# Patient Record
Sex: Male | Born: 1935 | Race: White | Hispanic: No | Marital: Married | Smoking: Former smoker
Health system: Southern US, Community
[De-identification: ages and names within clinical notes are randomized; demographics above are authoritative.]

## PROBLEM LIST (undated history)

## (undated) DIAGNOSIS — K56609 Unspecified intestinal obstruction, unspecified as to partial versus complete obstruction: Secondary | ICD-10-CM

## (undated) DIAGNOSIS — N184 Chronic kidney disease, stage 4 (severe): Secondary | ICD-10-CM

## (undated) DIAGNOSIS — J189 Pneumonia, unspecified organism: Secondary | ICD-10-CM

## (undated) DIAGNOSIS — E1142 Type 2 diabetes mellitus with diabetic polyneuropathy: Secondary | ICD-10-CM

## (undated) DIAGNOSIS — Z5189 Encounter for other specified aftercare: Secondary | ICD-10-CM

## (undated) DIAGNOSIS — M1990 Unspecified osteoarthritis, unspecified site: Secondary | ICD-10-CM

## (undated) DIAGNOSIS — Z8719 Personal history of other diseases of the digestive system: Secondary | ICD-10-CM

## (undated) DIAGNOSIS — F32A Depression, unspecified: Secondary | ICD-10-CM

## (undated) DIAGNOSIS — IMO0001 Reserved for inherently not codable concepts without codable children: Secondary | ICD-10-CM

## (undated) DIAGNOSIS — D649 Anemia, unspecified: Secondary | ICD-10-CM

## (undated) DIAGNOSIS — F329 Major depressive disorder, single episode, unspecified: Secondary | ICD-10-CM

## (undated) DIAGNOSIS — I739 Peripheral vascular disease, unspecified: Secondary | ICD-10-CM

## (undated) DIAGNOSIS — R413 Other amnesia: Secondary | ICD-10-CM

## (undated) DIAGNOSIS — K219 Gastro-esophageal reflux disease without esophagitis: Secondary | ICD-10-CM

## (undated) DIAGNOSIS — M4807 Spinal stenosis, lumbosacral region: Secondary | ICD-10-CM

## (undated) DIAGNOSIS — I10 Essential (primary) hypertension: Secondary | ICD-10-CM

## (undated) DIAGNOSIS — A0472 Enterocolitis due to Clostridium difficile, not specified as recurrent: Secondary | ICD-10-CM

## (undated) DIAGNOSIS — K279 Peptic ulcer, site unspecified, unspecified as acute or chronic, without hemorrhage or perforation: Secondary | ICD-10-CM

## (undated) DIAGNOSIS — R269 Unspecified abnormalities of gait and mobility: Secondary | ICD-10-CM

## (undated) DIAGNOSIS — E785 Hyperlipidemia, unspecified: Secondary | ICD-10-CM

## (undated) DIAGNOSIS — I219 Acute myocardial infarction, unspecified: Secondary | ICD-10-CM

## (undated) HISTORY — DX: Peptic ulcer, site unspecified, unspecified as acute or chronic, without hemorrhage or perforation: K27.9

## (undated) HISTORY — PX: EYE SURGERY: SHX253

## (undated) HISTORY — DX: Spinal stenosis, lumbosacral region: M48.07

## (undated) HISTORY — PX: LUMBAR LAMINECTOMY: SHX95

## (undated) HISTORY — PX: OTHER SURGICAL HISTORY: SHX169

## (undated) HISTORY — DX: Unspecified abnormalities of gait and mobility: R26.9

## (undated) HISTORY — PX: LIPOMA RESECTION: SHX23

## (undated) HISTORY — DX: Hyperlipidemia, unspecified: E78.5

## (undated) HISTORY — DX: Enterocolitis due to Clostridium difficile, not specified as recurrent: A04.72

## (undated) HISTORY — DX: Unspecified osteoarthritis, unspecified site: M19.90

## (undated) HISTORY — DX: Major depressive disorder, single episode, unspecified: F32.9

## (undated) HISTORY — PX: HEMORROIDECTOMY: SUR656

## (undated) HISTORY — DX: Other amnesia: R41.3

## (undated) HISTORY — PX: TRANSURETHRAL RESECTION OF PROSTATE: SHX73

## (undated) HISTORY — DX: Personal history of other diseases of the digestive system: Z87.19

## (undated) HISTORY — PX: CATARACT EXTRACTION: SUR2

## (undated) HISTORY — DX: Depression, unspecified: F32.A

## (undated) HISTORY — PX: URETERAL STENT PLACEMENT: SHX822

## (undated) HISTORY — PX: CERVICAL LAMINECTOMY: SHX94

## (undated) HISTORY — PX: CARPAL TUNNEL RELEASE: SHX101

---

## 1997-06-04 HISTORY — PX: FEMORAL-POPLITEAL BYPASS GRAFT: SHX937

## 1997-07-08 HISTORY — PX: FEMORAL-POPLITEAL BYPASS GRAFT: SHX937

## 1998-01-13 ENCOUNTER — Inpatient Hospital Stay (HOSPITAL_COMMUNITY): Admission: RE | Admit: 1998-01-13 | Discharge: 1998-01-15 | Payer: Self-pay | Admitting: Neurological Surgery

## 1998-06-24 ENCOUNTER — Encounter: Payer: Self-pay | Admitting: Neurological Surgery

## 1998-06-24 ENCOUNTER — Ambulatory Visit (HOSPITAL_COMMUNITY): Admission: RE | Admit: 1998-06-24 | Discharge: 1998-06-24 | Payer: Self-pay | Admitting: Neurological Surgery

## 1999-02-10 ENCOUNTER — Ambulatory Visit (HOSPITAL_COMMUNITY): Admission: RE | Admit: 1999-02-10 | Discharge: 1999-02-10 | Payer: Self-pay | Admitting: Internal Medicine

## 1999-02-10 ENCOUNTER — Encounter: Payer: Self-pay | Admitting: Internal Medicine

## 1999-04-01 ENCOUNTER — Encounter (INDEPENDENT_AMBULATORY_CARE_PROVIDER_SITE_OTHER): Payer: Self-pay | Admitting: Specialist

## 1999-04-01 ENCOUNTER — Ambulatory Visit (HOSPITAL_COMMUNITY): Admission: RE | Admit: 1999-04-01 | Discharge: 1999-04-01 | Payer: Self-pay | Admitting: *Deleted

## 1999-04-11 ENCOUNTER — Ambulatory Visit (HOSPITAL_COMMUNITY): Admission: RE | Admit: 1999-04-11 | Discharge: 1999-04-11 | Payer: Self-pay | Admitting: Oncology

## 1999-04-11 ENCOUNTER — Encounter (HOSPITAL_COMMUNITY): Payer: Self-pay | Admitting: Oncology

## 1999-05-13 ENCOUNTER — Ambulatory Visit (HOSPITAL_COMMUNITY): Admission: RE | Admit: 1999-05-13 | Discharge: 1999-05-13 | Payer: Self-pay | Admitting: *Deleted

## 1999-05-19 ENCOUNTER — Ambulatory Visit (HOSPITAL_COMMUNITY): Admission: RE | Admit: 1999-05-19 | Discharge: 1999-05-19 | Payer: Self-pay | Admitting: Oncology

## 1999-05-19 ENCOUNTER — Encounter (INDEPENDENT_AMBULATORY_CARE_PROVIDER_SITE_OTHER): Payer: Self-pay | Admitting: *Deleted

## 2000-01-20 ENCOUNTER — Encounter: Payer: Self-pay | Admitting: Orthopedic Surgery

## 2000-01-20 ENCOUNTER — Ambulatory Visit (HOSPITAL_COMMUNITY): Admission: RE | Admit: 2000-01-20 | Discharge: 2000-01-20 | Payer: Self-pay | Admitting: Orthopedic Surgery

## 2001-03-13 ENCOUNTER — Encounter: Admission: RE | Admit: 2001-03-13 | Discharge: 2001-03-13 | Payer: Self-pay | Admitting: *Deleted

## 2001-03-13 ENCOUNTER — Encounter: Payer: Self-pay | Admitting: Otolaryngology

## 2002-04-30 ENCOUNTER — Ambulatory Visit (HOSPITAL_BASED_OUTPATIENT_CLINIC_OR_DEPARTMENT_OTHER): Admission: RE | Admit: 2002-04-30 | Discharge: 2002-04-30 | Payer: Self-pay | Admitting: General Surgery

## 2004-12-13 ENCOUNTER — Emergency Department (HOSPITAL_COMMUNITY): Admission: EM | Admit: 2004-12-13 | Discharge: 2004-12-13 | Payer: Self-pay | Admitting: Emergency Medicine

## 2004-12-18 ENCOUNTER — Inpatient Hospital Stay (HOSPITAL_COMMUNITY): Admission: EM | Admit: 2004-12-18 | Discharge: 2004-12-21 | Payer: Self-pay | Admitting: Emergency Medicine

## 2004-12-18 ENCOUNTER — Ambulatory Visit: Payer: Self-pay | Admitting: Internal Medicine

## 2005-01-23 ENCOUNTER — Emergency Department (HOSPITAL_COMMUNITY): Admission: EM | Admit: 2005-01-23 | Discharge: 2005-01-23 | Payer: Self-pay | Admitting: Emergency Medicine

## 2005-02-17 ENCOUNTER — Ambulatory Visit (HOSPITAL_COMMUNITY): Admission: RE | Admit: 2005-02-17 | Discharge: 2005-02-17 | Payer: Self-pay | Admitting: *Deleted

## 2005-02-17 ENCOUNTER — Encounter (INDEPENDENT_AMBULATORY_CARE_PROVIDER_SITE_OTHER): Payer: Self-pay | Admitting: Specialist

## 2005-06-16 ENCOUNTER — Encounter (HOSPITAL_COMMUNITY): Admission: RE | Admit: 2005-06-16 | Discharge: 2005-09-14 | Payer: Self-pay | Admitting: Nephrology

## 2005-06-18 ENCOUNTER — Encounter: Admission: RE | Admit: 2005-06-18 | Discharge: 2005-06-18 | Payer: Self-pay | Admitting: Nephrology

## 2005-06-20 ENCOUNTER — Encounter: Admission: RE | Admit: 2005-06-20 | Discharge: 2005-06-20 | Payer: Self-pay | Admitting: Nephrology

## 2005-07-05 ENCOUNTER — Observation Stay (HOSPITAL_COMMUNITY): Admission: RE | Admit: 2005-07-05 | Discharge: 2005-07-06 | Payer: Self-pay | Admitting: Nephrology

## 2005-08-11 ENCOUNTER — Encounter: Admission: RE | Admit: 2005-08-11 | Discharge: 2005-08-11 | Payer: Self-pay | Admitting: Nephrology

## 2005-09-11 ENCOUNTER — Emergency Department (HOSPITAL_COMMUNITY): Admission: EM | Admit: 2005-09-11 | Discharge: 2005-09-11 | Payer: Self-pay | Admitting: Emergency Medicine

## 2005-10-13 ENCOUNTER — Encounter (HOSPITAL_COMMUNITY): Admission: RE | Admit: 2005-10-13 | Discharge: 2006-01-11 | Payer: Self-pay | Admitting: Nephrology

## 2006-02-08 ENCOUNTER — Emergency Department (HOSPITAL_COMMUNITY): Admission: EM | Admit: 2006-02-08 | Discharge: 2006-02-09 | Payer: Self-pay | Admitting: Emergency Medicine

## 2006-02-10 ENCOUNTER — Encounter: Admission: RE | Admit: 2006-02-10 | Discharge: 2006-02-10 | Payer: Self-pay | Admitting: Interventional Radiology

## 2006-08-31 ENCOUNTER — Encounter: Admission: RE | Admit: 2006-08-31 | Discharge: 2006-08-31 | Payer: Self-pay | Admitting: Interventional Radiology

## 2006-11-23 ENCOUNTER — Encounter: Admission: RE | Admit: 2006-11-23 | Discharge: 2006-11-23 | Payer: Self-pay | Admitting: Nephrology

## 2006-12-16 ENCOUNTER — Ambulatory Visit: Payer: Self-pay | Admitting: Vascular Surgery

## 2007-06-05 ENCOUNTER — Ambulatory Visit: Payer: Self-pay | Admitting: Vascular Surgery

## 2007-08-30 ENCOUNTER — Encounter: Admission: RE | Admit: 2007-08-30 | Discharge: 2007-08-30 | Payer: Self-pay | Admitting: Interventional Radiology

## 2007-12-04 ENCOUNTER — Ambulatory Visit: Payer: Self-pay | Admitting: Surgery

## 2008-06-03 ENCOUNTER — Ambulatory Visit: Payer: Self-pay | Admitting: Surgery

## 2008-11-27 ENCOUNTER — Ambulatory Visit: Payer: Self-pay | Admitting: Vascular Surgery

## 2009-01-28 ENCOUNTER — Encounter: Admission: RE | Admit: 2009-01-28 | Discharge: 2009-01-28 | Payer: Self-pay | Admitting: Orthopedic Surgery

## 2009-03-28 ENCOUNTER — Ambulatory Visit: Payer: Self-pay | Admitting: Vascular Surgery

## 2009-07-04 ENCOUNTER — Ambulatory Visit: Payer: Self-pay | Admitting: Vascular Surgery

## 2009-07-14 ENCOUNTER — Encounter: Admission: RE | Admit: 2009-07-14 | Discharge: 2009-07-14 | Payer: Self-pay | Admitting: Neurology

## 2010-02-17 ENCOUNTER — Ambulatory Visit: Payer: Self-pay | Admitting: Vascular Surgery

## 2010-02-22 ENCOUNTER — Emergency Department (HOSPITAL_COMMUNITY): Admission: EM | Admit: 2010-02-22 | Discharge: 2010-02-22 | Payer: Self-pay | Admitting: Emergency Medicine

## 2010-06-13 ENCOUNTER — Inpatient Hospital Stay (HOSPITAL_COMMUNITY)
Admission: EM | Admit: 2010-06-13 | Discharge: 2010-06-17 | Payer: Self-pay | Source: Home / Self Care | Attending: Internal Medicine | Admitting: Internal Medicine

## 2010-07-14 ENCOUNTER — Encounter
Admission: RE | Admit: 2010-07-14 | Discharge: 2010-07-14 | Payer: Self-pay | Source: Home / Self Care | Attending: Gastroenterology | Admitting: Gastroenterology

## 2010-08-31 LAB — COMPREHENSIVE METABOLIC PANEL
ALT: 10 U/L (ref 0–53)
BUN: 28 mg/dL — ABNORMAL HIGH (ref 6–23)
CO2: 24 mEq/L (ref 19–32)
CO2: 27 mEq/L (ref 19–32)
Calcium: 8.3 mg/dL — ABNORMAL LOW (ref 8.4–10.5)
Chloride: 102 mEq/L (ref 96–112)
Chloride: 109 mEq/L (ref 96–112)
Creatinine, Ser: 1.83 mg/dL — ABNORMAL HIGH (ref 0.4–1.5)
GFR calc non Af Amer: 35 mL/min — ABNORMAL LOW (ref >60)
GFR calc non Af Amer: 52 mL/min — ABNORMAL LOW (ref >60)
Glucose, Bld: 134 mg/dL — ABNORMAL HIGH (ref 70–99)
Glucose, Bld: 152 mg/dL — ABNORMAL HIGH (ref 70–99)
Sodium: 140 mEq/L (ref 135–145)
Total Bilirubin: 0.6 mg/dL (ref 0.3–1.2)
Total Bilirubin: 0.8 mg/dL (ref 0.3–1.2)

## 2010-08-31 LAB — GLUCOSE, CAPILLARY
Glucose-Capillary: 145 mg/dL — ABNORMAL HIGH (ref 70–99)
Glucose-Capillary: 184 mg/dL — ABNORMAL HIGH (ref 70–99)
Glucose-Capillary: 89 mg/dL (ref 70–99)
Glucose-Capillary: 99 mg/dL (ref 70–99)

## 2010-08-31 LAB — HEMOGLOBIN AND HEMATOCRIT, BLOOD
HCT: 34.2 % — ABNORMAL LOW (ref 39.0–52.0)
HCT: 35.3 % — ABNORMAL LOW (ref 39.0–52.0)
Hemoglobin: 11.1 g/dL — ABNORMAL LOW (ref 13.0–17.0)
Hemoglobin: 11.2 g/dL — ABNORMAL LOW (ref 13.0–17.0)
Hemoglobin: 11.8 g/dL — ABNORMAL LOW (ref 13.0–17.0)

## 2010-08-31 LAB — DIFFERENTIAL
Basophils Absolute: 0 10*3/uL (ref 0.0–0.1)
Basophils Relative: 0 % (ref 0–1)
Lymphocytes Relative: 7 % — ABNORMAL LOW (ref 12–46)
Neutro Abs: 13.1 10*3/uL — ABNORMAL HIGH (ref 1.7–7.7)

## 2010-08-31 LAB — URINALYSIS, ROUTINE W REFLEX MICROSCOPIC
Ketones, ur: NEGATIVE mg/dL
Leukocytes, UA: NEGATIVE
Nitrite: NEGATIVE
pH: 5.5 (ref 5.0–8.0)

## 2010-08-31 LAB — GASTRIC OCCULT BLOOD (1-CARD TO LAB): Occult Blood, Gastric: POSITIVE — AB

## 2010-08-31 LAB — BASIC METABOLIC PANEL
CO2: 23 mEq/L (ref 19–32)
Calcium: 8.4 mg/dL (ref 8.4–10.5)
Chloride: 114 mEq/L — ABNORMAL HIGH (ref 96–112)
GFR calc Af Amer: >60 mL/min (ref >60)
GFR calc non Af Amer: >60 mL/min (ref >60)
Glucose, Bld: 84 mg/dL (ref 70–99)
Glucose, Bld: 93 mg/dL (ref 70–99)
Potassium: 3.9 mEq/L (ref 3.5–5.1)
Sodium: 143 mEq/L (ref 135–145)
Sodium: 144 mEq/L (ref 135–145)

## 2010-08-31 LAB — MAGNESIUM: Magnesium: 2.2 mg/dL (ref 1.5–2.5)

## 2010-08-31 LAB — LIPASE, BLOOD: Lipase: 26 U/L (ref 11–59)

## 2010-08-31 LAB — MRSA PCR SCREENING: MRSA by PCR: NEGATIVE

## 2010-08-31 LAB — CBC
HCT: 31.9 % — ABNORMAL LOW (ref 39.0–52.0)
HCT: 41.2 % (ref 39.0–52.0)
Hemoglobin: 10.2 g/dL — ABNORMAL LOW (ref 13.0–17.0)
Hemoglobin: 13.6 g/dL (ref 13.0–17.0)
MCH: 29.4 pg (ref 26.0–34.0)
MCHC: 32 g/dL (ref 30.0–36.0)
MCV: 89.2 fL (ref 78.0–100.0)
RBC: 3.63 MIL/uL — ABNORMAL LOW (ref 4.22–5.81)
RBC: 4.62 MIL/uL (ref 4.22–5.81)
WBC: 6.6 10*3/uL (ref 4.0–10.5)

## 2010-08-31 LAB — URINE MICROSCOPIC-ADD ON

## 2010-08-31 LAB — TYPE AND SCREEN: ABO/RH(D): O NEG

## 2010-09-03 LAB — POCT I-STAT, CHEM 8
Chloride: 101 mEq/L (ref 96–112)
Creatinine, Ser: 2.1 mg/dL — ABNORMAL HIGH (ref 0.4–1.5)
Glucose, Bld: 173 mg/dL — ABNORMAL HIGH (ref 70–99)
HCT: 31 % — ABNORMAL LOW (ref 39.0–52.0)
Hemoglobin: 10.5 g/dL — ABNORMAL LOW (ref 13.0–17.0)
Potassium: 3.6 mEq/L (ref 3.5–5.1)
Sodium: 137 mEq/L (ref 135–145)

## 2010-09-03 LAB — CBC
HCT: 30.7 % — ABNORMAL LOW (ref 39.0–52.0)
Hemoglobin: 10.5 g/dL — ABNORMAL LOW (ref 13.0–17.0)
RBC: 3.53 MIL/uL — ABNORMAL LOW (ref 4.22–5.81)
WBC: 17 10*3/uL — ABNORMAL HIGH (ref 4.0–10.5)

## 2010-09-03 LAB — URINALYSIS, ROUTINE W REFLEX MICROSCOPIC
Glucose, UA: NEGATIVE mg/dL
Hgb urine dipstick: NEGATIVE
Protein, ur: NEGATIVE mg/dL
Specific Gravity, Urine: 1.013 (ref 1.005–1.030)
Urobilinogen, UA: 1 mg/dL (ref 0.0–1.0)

## 2010-09-03 LAB — DIFFERENTIAL
Lymphocytes Relative: 6 % — ABNORMAL LOW (ref 12–46)
Lymphs Abs: 1 10*3/uL (ref 0.7–4.0)
Monocytes Absolute: 1.3 10*3/uL — ABNORMAL HIGH (ref 0.1–1.0)
Monocytes Relative: 8 % (ref 3–12)
Neutro Abs: 14.6 10*3/uL — ABNORMAL HIGH (ref 1.7–7.7)
Neutrophils Relative %: 86 % — ABNORMAL HIGH (ref 43–77)

## 2010-09-03 LAB — GLUCOSE, CAPILLARY: Glucose-Capillary: 173 mg/dL — ABNORMAL HIGH (ref 70–99)

## 2010-11-03 NOTE — Procedures (Signed)
BYPASS GRAFT EVALUATION   INDICATION:  Followup bilateral femoral-popliteal artery bypass grafts,  stable minimal claudication.   HISTORY:  Diabetes:  Yes.  Cardiac:  MI in 1974.  Hypertension:  Yes.  Smoking:  Quit.  Previous Surgery:  Bilateral femoral-popliteal artery bypass grafts.   SINGLE LEVEL ARTERIAL EXAM                               RIGHT              LEFT  Brachial:                    138                135  Anterior tibial:             120                147  Posterior tibial:            132                78  Peroneal:  Ankle/brachial index:        0.96               1.07   PREVIOUS ABI:  Date: 12/04/2007  RIGHT:  0.9  LEFT:  0.72/noncompressible   LOWER EXTREMITY BYPASS GRAFT DUPLEX EXAM:   DUPLEX:  1. Doppler arterial waveforms appear biphasic proximal to, within and      distal to bypass grafts with no evidence of graft stenosis.  2. Known elevated velocities in native distal to right bypass graft as      documented 07/09/2005, today at 379 cm/s.   IMPRESSION:  1. Patent bilateral femoral-popliteal artery bypass grafts.  2. Right ABI appears stable.  3. Left ABI shows increase from previous study, however correlates      with studies before that.  4. Stable known stenosis in native artery distal to the right bypass      graft.      ___________________________________________  Larina Earthly, M.D.   AS/MEDQ  D:  06/03/2008  T:  06/03/2008  Job:  [id number]

## 2010-11-03 NOTE — Procedures (Signed)
BYPASS GRAFT EVALUATION   INDICATION:  Followup, bilateral fem-pop bypass grafts.   HISTORY:  Diabetes:  Yes.  Cardiac:  MI in 1974.  Hypertension:  Yes.  Smoking:  Quit 34 years ago.  Previous Surgery:  Bilateral fem-pop bypass grafts.   SINGLE LEVEL ARTERIAL EXAM                               RIGHT              LEFT  Brachial:                    119                121  Anterior tibial:             102                Noncompressible  Posterior tibial:            109                93  Peroneal:  Ankle/brachial index:        0.9                0.72   PREVIOUS ABI:  Date: 06/05/07  RIGHT:  0.84  LEFT:  1.02   LOWER EXTREMITY BYPASS GRAFT DUPLEX EXAM:   DUPLEX:  Biphasic Doppler waveforms noted throughout the bilateral lower  extremity bypass grafts in their native vessels with no evidence of  increased velocities noted.   IMPRESSION:  1. Patent bilateral femoropopliteal bypass grafts with no evidence of      stenosis.  2. No significant change noted in the right ankle brachial index since      previous examination.  3. The left ankle brachial index has decreased significantly; however,      this is due to the previously used ankle pressure site becoming      calcified.     ___________________________________________  Larina Earthly, M.D.   CH/MEDQ  D:  12/04/2007  T:  12/04/2007  Job:  [id number]

## 2010-11-03 NOTE — Procedures (Signed)
BYPASS GRAFT EVALUATION   INDICATION:  Follow-up evaluation of lower extremity bypass graft.   HISTORY:  Diabetes:  Yes.  Cardiac:  MI in 1974.  Hypertension:  Yes.  Smoking:  Quit.  Previous Surgery:  Bilateral femoral to popliteal artery bypass graft.   SINGLE LEVEL ARTERIAL EXAM                               RIGHT              LEFT  Brachial:                    133                126  Anterior tibial:             142                155  Posterior tibial:            108                109  Peroneal:  Ankle/brachial index:        1.07               1.17   PREVIOUS ABI:  Date:  06/03/08  RIGHT:  0.96  LEFT:  1.07   LOWER EXTREMITY BYPASS GRAFT DUPLEX EXAM:   DUPLEX:  1. Increased velocity of 499 cm/s noted at the distal bypass native      artery, which was known to be 379 cm/s from the previous study.  2. Biphasic duplex waveforms noted within bilateral bypass graft in      native arteries.   IMPRESSION:  1. Patent bilateral femoral to popliteal artery bypass graft with no      evidence of focal stenosis.  2. Normal bilateral lower extremity ankle brachial index with biphasic      Doppler waveforms.   ___________________________________________  Larina Earthly, M.D.   AC/MEDQ  D:  11/27/2008  T:  11/27/2008  Job:  [id number]

## 2010-11-03 NOTE — Procedures (Signed)
BYPASS GRAFT EVALUATION   INDICATION:  Followup bilateral femoral popliteal bypass grafts   HISTORY:  Diabetes:  yes  Cardiac:  MI in 1974  Hypertension:  yes  Smoking:  Previous  Previous Surgery:  Bilateral femoral popliteal artery bypass grafts   SINGLE LEVEL ARTERIAL EXAM                               RIGHT              LEFT  Brachial:                    173                124  Anterior tibial:             134                78  Posterior tibial:            108                65  Peroneal:  Ankle/brachial index:        1.08               0.63   PREVIOUS ABI:  Date: 07/04/2009  RIGHT:  0.80  LEFT:  0.70   LOWER EXTREMITY BYPASS GRAFT DUPLEX EXAM:   DUPLEX:  Patent bilateral femoral popliteal artery bypass grafts with  elevated velocities of 480 cm/sec at outflow level of right leg.   IMPRESSION:  1. Stable ankle brachial indices bilateral.  2. Patent bilateral femoral popliteal artery bypass grafts by      velocities as noted above.   ___________________________________________  Larina Earthly, M.D.   CB/MEDQ  D:  02/18/2010  T:  02/18/2010  Job:  [id number]

## 2010-11-03 NOTE — Procedures (Signed)
BYPASS GRAFT EVALUATION   INDICATION:  Bilateral femoral-popliteal bypass grafts.   HISTORY:  Diabetes:  Yes.  Cardiac:  MI in 1974.  Hypertension:  Yes.  Smoking:  Previous.  Previous Surgery:  Bilateral femoral-popliteal bypass grafts.   SINGLE LEVEL ARTERIAL EXAM                               RIGHT              LEFT  Brachial:                    115                111  Anterior tibial:             44                 35  Posterior tibial:            92                 80  Peroneal:  Ankle/brachial index:        0.80               0.70   PREVIOUS ABI:  Date:  11/27/2008  RIGHT:  1.07  LEFT:  1.07   LOWER EXTREMITY BYPASS GRAFT DUPLEX EXAM:   DUPLEX:  Biphasic Doppler waveforms throughout bilateral bypass grafts  and native arteries.   IMPRESSION:  1. Patent bilateral bypass grafts with no evidence of stenosis.  2. Ankle brachial indices suggest mild to moderate disease      bilaterally.   ___________________________________________  Larina Earthly, M.D.   CJ/MEDQ  D:  07/04/2009  T:  07/04/2009  Job:  [id number]

## 2010-11-03 NOTE — Assessment & Plan Note (Signed)
OFFICE VISIT   Knapp, Joshua  DOB:  [birth year]                                       03/28/2009  JYNWG#:[id number]   The patient presents today for evaluation of bilateral lower extremity  weakness.  He is a very pleasant 77 year old gentleman well-known to me  from staged bilateral femoral to popliteal bypasses.  He had undergone a  left fem-pop bypass in 1999 and right fem-pop bypass in 1998.  He has  been followed in our noninvasive labs since then with patent grafts  bilaterally.  He now complains of pain and weakness and numbness in both  lower extremities.  He reports that this occurs at rest but becomes much  more severe with walking.  He reports that they become numb and he is  unable to continue walking.  He does not have any tissue loss.  He  denies any new recent cardiac difficulties.  He does have blood pressure  today of 116/66, pulse 58, respirations 18.  His radial pulses are 2+  bilaterally.  He does have well-perfused feet bilaterally with palpable  popliteal pulses bilaterally.  I do not feel pedal pulses.   I reviewed his noninvasive vascular studies with him.  Most recently he  underwent studies in June showing normal ankle-arm indices bilaterally.  He does have some narrowing in his native vessels below the bypass but  no limitation of flow.  I discussed this with the patient and his wife.  I do not see any evidence of any arterial insufficiency that would  explain his lower extremity difficulty.  This does potentially appear to  be neurogenic.  He has seen Dr. Darrelyn Hillock who apparently does not see any  specific back issues that would explain this as well.  He is to see Dr.  Renne Crigler next week and will continue discussion with this and will see [place]  in our noninvasive lab.   Larina Earthly, M.D.  Electronically Signed   TFE/MEDQ  D:  03/28/2009  T:  03/31/2009  Job:  [id number]   cc:   Windy Fast A. Darrelyn Hillock, M.D.  Soyla Murphy. Renne Crigler, M.D.

## 2010-11-03 NOTE — Procedures (Signed)
BYPASS GRAFT EVALUATION   INDICATION:  Follow up bilateral fem-pop bypass graft.   HISTORY:  Diabetes:  Yes.  Cardiac:  MI in 1974.  Hypertension:  Yes.  Smoking:  Quit 34 years ago.  Previous Surgery:  Bilateral fem-pop bypass graft.   SINGLE LEVEL ARTERIAL EXAM                               RIGHT              LEFT  Brachial:                    127                129  Anterior tibial:             89                 132  Posterior tibial:            109                73  Peroneal:  Ankle/brachial index:        0.84               1.02   PREVIOUS ABI:  Date:  12/16/2006  RIGHT:  0.80  LEFT:  >1   LOWER EXTREMITY BYPASS GRAFT DUPLEX EXAM:   DUPLEX:  Patent bilateral fem-pop bypass graft with no evidence of focal  stenosis.   IMPRESSION:  1. Patent bilateral femoral-popliteal bypass graft with no evidence of      focal stenosis.  2. Normal ankle-brachial index with biphasic Doppler waveform noted in      left leg.  3. Mildly abnormal ankle-brachial index with biphasic Doppler waveform      noted in the right leg.  Status post bilateral femoral-popliteal      bypass graft.   ___________________________________________  Larina Earthly, M.D.   MG/MEDQ  D:  06/05/2007  T:  06/06/2007  Job:  [id number]

## 2010-11-03 NOTE — Assessment & Plan Note (Signed)
OFFICE VISIT   CISZEWSKI, Joshua  DOB:  September 05, 1920                                       07/04/2009  CHART#:[id number]   The patient presents today for continued followup of his peripheral  vascular occlusive disease.  He is status post bilateral staged femoral-  popliteal bypasses by myself in the right leg in 1998 and left leg in  1999.  He continues to have overall failure in health.  His main  complaints are bilateral lower extremity pain.  This is in his entire  left leg.  He reports he has difficulty in getting into a car from this.  He does have discomfort in both feet.  This does appear to be  neuropathic type pain.  He does not walk to the level of claudication.  His past history is otherwise unchanged.   PHYSICAL EXAM:  General:  A well-developed frail-appearing gentleman  appearing his stated age of 24.  Vital signs:  Blood pressure is 100/61,  pulse 62, respirations 18.  His temperature is 97.6.  His radial pulses  are 2+.  He has 2+ popliteal pulses bilaterally.  His feet have no  rashes.  He does have some dependent rubor.   He underwent noninvasive vascular laboratory studies in our office today  and I reviewed this with the patient and his wife.  This shows no  evidence of stenosis throughout his grafts on duplex imaging  bilaterally.  Ankle arm index is near normal at 0.8 on the right and 0.7  on the left.  I explained to the patient and his wife I do not see any  evidence for arterial insufficiency causing his leg discomfort.  He will  continue his followup with Dr. Darrelyn Hillock to see if anything can be done  for his discomfort.  He does have CT scans in the past showing moderate  stenosis in his lumbar spine.     Larina Earthly, M.D.  Electronically Signed   TFE/MEDQ  D:  07/04/2009  T:  07/04/2009  Job:  [id number]   cc:   Soyla Murphy. Renne Crigler, M.D.  Ronald A. Darrelyn Hillock, M.D.

## 2010-11-03 NOTE — Procedures (Signed)
BYPASS GRAFT EVALUATION   INDICATION:  Followup evaluation of lower extremity bypass grafts  bilaterally.   HISTORY:  Diabetes:  Orally controlled.  Cardiac:  MI in 1974.  Hypertension:  Yes.  Smoking:  Quit in 1974.  Previous Surgery:  Right fem-pop bypass graft with translocated  nonreversed saphenous vein by Dr. Arbie Cookey on July 08, 1997.  Left fem-  pop bypass graft with translocated nonreversed saphenous vein by Dr.  Arbie Cookey on June 04, 1997.   SINGLE LEVEL ARTERIAL EXAMINATION:   SINGLE LEVEL ARTERIAL EXAM                               RIGHT              LEFT  Brachial:                    148.               146.  Anterior tibial:             114.               165.  Posterior tibial:            119.               73.  Peroneal:  Ankle/brachial index:        0.80.              Greater than 1.0.   PREVIOUS ABI:  Date:  July 08, 2006.  RIGHT:  0.79.  LEFT:  Greater  than 1.0.   LOWER EXTREMITY BYPASS GRAFT DUPLEX EXAM:   DUPLEX:  Wave forms are biphasic proximal to within and distal to the  right fem-pop bypass graft.   Wave forms are biphasic proximal to within and distal to the left fem-  pop bypass graft with monophasic Doppler wave forms at the tibial level  and the ankle.   IMPRESSION:  ABIs are stable from previous study bilaterally.   Patent left fem-pop bypass graft.   Patent right fem-pop bypass graft.   ___________________________________________  Larina Earthly, M.D.   MC/MEDQ  D:  12/16/2006  T:  12/16/2006  Job:  [id number]

## 2010-11-06 NOTE — H&P (Signed)
NAME:  CORIAN, HANDLEY NO.:  [id number]   MEDICAL RECORD NO.:  [id number]          PATIENT TYPE:  EMS   LOCATION:  ED                           FACILITY:  [facility]   PHYSICIAN:  Mobolaji B. Bakare, M.D.DATE OF BIRTH:  [birth year]   DATE OF ADMISSION:  12/17/2004  DATE OF DISCHARGE:                                HISTORY & PHYSICAL   PRIMARY CARE PHYSICIAN:  Soyla Murphy. Renne Crigler, M.D.   UROLOGIST:  Excell Seltzer. Annabell Howells, M.D.   CHIEF COMPLAINT:  Nausea/vomiting.   HISTORY OF PRESENT ILLNESS:  Mr. Devynn Scheff started feeling nauseous and  unwell about 12 midday yesterday.  He had reduced appetite.  Later during  the day, he went to the grocery store with his wife and had an accidental  fall, subsequently vomited about 2-3 times and was brought to the emergency  department for evaluation.  He did not sustain any fracture.  He did have  initial pain over the left hip.  An x-ray did not show any bone injury.  He  was noted to have coffee ground vomitus but there was no frank bleeding.  Rectal examination done in the ER showed negative fecal Hemoccult.  The  patient is being admitted for evaluation and treatment.   REVIEW OF SYSTEMS:  He denies dizziness, abdominal pain.  No diarrhea.  No  constipation.  No fever.  No chills.  No cough, shortness of breath, chest  pain, or diaphoresis.   PAST MEDICAL HISTORY:  1.  History of angina.  2.  History of bronchial infection.  3.  Diabetes mellitus, diet controlled.  4.  Hypertension.  5.  Anemia.   CURRENT MEDICATIONS:  1.  Lisinopril 25 mg p.o. q.d.  2.  Atenolol 50 mg p.o. q.d.  3.  Norvasc 5 mg p.o. q.d.  4.  Doxazosin 2 mg p.o. q.d.  5.  Aspirin 81 mg p.o. q.d.  6.  Vicodin 5 mg p.r.n.  7.  Iron 2 tablets q.d.   ALLERGIES:  AMOXICILLIN causes swelling in both hands.   FAMILY HISTORY:  Noncontributory.   SOCIAL HISTORY:  The patient does not smoke cigarettes and does not drink  alcohol.  He is fairly independent of  activities of daily living.  He still  drives his car, not limited in the distance he can travel.  He goes grocery  shopping.   PHYSICAL EXAMINATION:  VITAL SIGNS:  Temperature 99.2, blood pressure  151/63, pulse 57, respiratory rate 20, saturation 98% .  GENERAL:  The patient is awake, alert, oriented to time, place, and person.  HEENT:  Eyes:  Anicteric.  NECK:  He has a left carotid bruit.  No thyromegaly.  No elevated JVD.  RESPIRATORY:  Not dyspneic.  LUNGS:  Clear clinically to auscultation.  CARDIOVASCULAR:  S1 and S2 with a short systolic ejection murmur in the left  second intercostal space.  ABDOMEN:  Obese, soft, nontender.  No holosystolic murmur.  No  hepatosplenomegaly.  Bowel sounds present.  RECTAL:  No masses felt in the rectum.  Occult blood negative.  EXTREMITIES:  Bilateral pitting edema  at 1+.  Dorsalis pedis pulses are  palpable bilaterally.  CENTRAL NERVOUS SYSTEM:  No focal neurological deficit.  MUSCULOSKELETAL:  Mild tenderness over the left hip.  The patient can move  all limbs.   LABORATORY DATA:  PT 14.5, INR 1.1, lipase 20, white cells 14.9, hemoglobin  9.4, hematocrit 26.6, platelets 151, neutrophils 89, lymphocytes 5.  Sodium  123, potassium 4.2, chloride 90, CO2 24, glucose 127, BUN 27, creatinine  1.7, calcium 8.9.  Pelvic x-ray:  No acute findings. 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   ASSESSMENT AND PLAN:  Mr. Moehring is an 77 year old Caucasian male with a  history of angina, diet-controlled diabetes mellitus, myocardial infarction,  presenting with nausea and vomiting without abdominal pain or fever.  He has  leukocytosis and coffee ground material in his vomitus.  Two Hemoccults are  negative.   Problem 1. Nausea and vomiting.  Probably viral etiology.  Give Phenergan  p.r.n., IV of normal saline, plus 20 mEq KCl at 125 cc/hr.  Check vomitus  for Gastroccult.  Possible Mallory-Weiss tear.  The patient is  currently not  vomiting.  Check serial cardiac enzymes x2.   Problem 2. Hyponatremia secondary to vomiting.  Will hydrate as above.  The  patient is asymptomatic.   Problem 3. Azotemia.  BUN and creatinine 27 and 1.7.  The patient has had  recent urinary retention and was seen by Dr. Annabell Howells this week.  This could  have contributed to the elevated BUN and creatinine.  However, I anticipate  that with hydration, these will resolve.   Problem 4. Anemia.  He has a history of chronic anemia and is on iron.  Will  check iron studies, vitamin B12, and folate.   Problem 5. Coronary artery disease.  Continue atenolol, lisinopril.  Hold  aspirin for now until vomiting resolves completely and no coffee ground  emesis.   Problem 6. Hypertension.  Continue Norvasc, atenolol, and lisinopril.   Problem 7. History of urinary retention/benign prostatic hypertrophy.  Continue doxazosin.   Problem 8. Left carotid bruit.  The patient had carotid Dopplers by Dr.  Lucas Mallow last week.  He is supposed to be following up with him soon.       MBB/MEDQ  D:  12/18/2004  T:  12/18/2004  Job:  [id number]   cc:   Soyla Murphy. Renne Crigler, M.D.  [address]  [place]  [place] [address]  Fax: [phone]   Kegan Mckeithan. Lucas Mallow, M.D.  [address]  [place]  [place] [address]  Fax: [phone]   Richardean Sale, M.D.  [address]  [place]  [place] [address]  Fax: [phone]

## 2010-11-06 NOTE — Discharge Summary (Signed)
NAMEJAIVEER, PANAS               ACCOUNT NO.:  [id number]   MEDICAL RECORD NO.:  [id number]          PATIENT TYPE:  INP   LOCATION:  1414                         FACILITY:  [facility]   PHYSICIAN:  Mallory Shirk, MD     DATE OF BIRTH:  1920/07/26   DATE OF ADMISSION:  12/17/2004  DATE OF DISCHARGE:                                 DISCHARGE SUMMARY   DISCHARGE DIAGNOSES:  1.  Upper gastrointestinal bleed.  2.  Coronary artery disease.  3.  Hypertension.  4.  Anemia.   DISCHARGE MEDICATIONS:  1.  Norvasc 10 mg p.o. daily.  2.  Atenolol 12.5 mg p.o. daily.  3.  Doxazosin 2 mg p.o. daily.  4.  Mirtazapine 15 mg p.o. daily.  5.  Protonix 40 mg p.o. daily x1 month.  6.  Iron two tablets daily as prescribed by Dr. Renne Crigler.  7.  Lisinopril 40 mg p.o. daily.   Please note:  Hold aspirin for 2 weeks. Also note changes in the dosage of  lisinopril, atenolol, and Norvasc.   FOLLOW-UP APPOINTMENTS:  1.  With Dr. Merri Brunette, [phone], on December 24, 2004 at 2:45 p.m. at which      time the patient will review all medications with Dr. Renne Crigler.  2.  With Dr. Sabino Gasser, [phone], gastroenterology, on January 14, 2005 at 4      p.m.   HISTORY OF PRESENT ILLNESS:  Mr. Marchitto is a very pleasant 77 yo Caucasian  gentleman who presented to the emergency department on December 17, 2004 with  complaints of nausea and general malaise for several hours with decreased  appetite. The patient subsequently went to the grocery store and had a fall,  vomited about 2-3 times. Was brought to the emergency department. Did not  sustain any fractures. Did have an initial pain over the left hip. X-rays  did not show any bone injury. Noted to have coffee ground vomitus but no  frank bleeding. Rectal exam showed negative for fecal hemoccult blood. The  patient was admitted for further evaluation.   PAST MEDICAL HISTORY:  1.  Angina.  2.  Bronchial infection.  3.  Diabetes mellitus, diet control.  4.  Hypertension.  5.   Anemia.   MEDICATIONS ON ADMISSION:  1.  Lisinopril 20 mg p.o. daily.  2.  Atenolol 50 mg p.o. daily.  3.  Norvasc 5 mg p.o. daily.  4.  Doxazosin 2 mg p.o. daily.  5.  Aspirin 81 mg p.o. daily.  6.  Vicodin 5/325 one to two tablet p.o. p.r.n.  7.  Iron two tablets p.o. daily.   ALLERGIES:  The patient has allergies to AMOXICILLIN.   PHYSICAL EXAMINATION ON ADMISSION:  VITAL SIGNS:  Blood pressure 151/63,  pulse 57, respiratory rate 20, O2 saturation 98%, temperature 99.2.  GENERAL:  Elderly Caucasian gentleman in no acute distress, alert and  oriented x3.  HEENT:  Normocephalic, atraumatic. PERRL, sclerae anicteric. Mucous  membranes moist.  NECK:  Supple, left carotid bruit noted, no JVD.  LUNGS:  Clear to auscultation bilaterally. No wheezes, no rales.  CARDIOVASCULAR:  S1 plus S1, systolic ejection murmur left sternal border.  ABDOMEN:  Soft, positive bowel sounds, no organomegaly.  RECTAL:  No masses, fecal occult blood negative.  EXTREMITIES:  Show 1+ pitting edema bilaterally.  NEUROLOGIC:  Nonfocal.   LABORATORY DATA:  PT 14.5, INR 1.1. Lipase 20. Wbc's 14.9, hemoglobin 9.4,  hematocrit 26.6, platelets 151, neutrophils 89, lymphocytes 5. Sodium 123,  potassium 4.2, chloride 90, carbon dioxide 24, glucose 127, BUN 27,  creatinine 1.7, calcium 8.9.   X-RAYS:  Pelvis:  No acute fractures, bilateral DJD. Left spinal DJD seen.   HOSPITAL COURSE:  The patient was admitted to a monitored bed.   #1 - COFFEE GROUND EMESIS. The patient was seen by GI, Dr. Marina Goodell, and  underwent an upper endoscopy which showed a large gastric AVM which was  successfully treated with epinephrine injection. The patient had no episodes  of hematemesis after admission. On December 18, 2004 the patient's H&H was  8.0/22.9. He was transfused 2 units PRBCs. Subsequently, his hemoglobin and  hematocrit have been stable. There are no episodes of bleeding during the  hospital stay. On day of discharge, his  H&H was 12.7/36.7.   #2 - HYPERTENSION. The patient has difficulty controlling his blood pressure  per patient and his wife. In the hospital, the patient's heart rate was in  the high 50s. His atenolol has been decreased from 50 mg p.o. b.i.d. to 12.5  mg p.o. b.i.d. His Norvasc has been increased from 5 to 10 mg p.o. daily and  lisinopril has been increased to 40 mg p.o. daily. On day of discharge, the  patient's blood pressure was 162/73. Further management of blood pressure is  deferred to Dr. Renne Crigler, whom the patient will be seeing in 3 days after  discharge.   #3 - ANEMIA. After the blood transfusion the patient's H&H is within normal  limits. Dr. Renne Crigler will continue to follow this. The patient also has a  follow-up appointment with Dr. Sabino Gasser, GI, on January 14, 2005.   #4 - BENIGN PROSTATIC HYPERTROPHY. His doxazosin was continued.   DISPOSITION:  The patient was discharged home in stable condition. The  patient was ambulating down the halls using a cane without difficulty. His  wife will take him home.       GDK/MEDQ  D:  12/21/2004  T:  12/21/2004  Job:  [id number]   cc:   Excell Seltzer. Annabell Howells, M.D.  [address]  [place]  [place] [address]  Fax: [phone]   Soyla Murphy. Renne Crigler, M.D.  [address]  [place]  [place] [address]  Fax: [phone]   Charls Custer. Lucas Mallow, M.D.  [address]  [place]  [place] [address]  Fax: [phone]   Georgiana Spinner, M.D.  [address]  [place]  [place] [address]  Fax: [phone]

## 2010-11-06 NOTE — Op Note (Signed)
NAMEVERNIS, EID NO.:  [id number]   MEDICAL RECORD NO.:  [id number]          PATIENT TYPE:  AMB   LOCATION:  ENDO                         FACILITY:  [facility]   PHYSICIAN:  Georgiana Spinner, M.D.    DATE OF BIRTH:  [birth year]   DATE OF PROCEDURE:  02/17/2005  DATE OF DISCHARGE:                                 OPERATIVE REPORT   PROCEDURE:  Upper endoscopy.   INDICATIONS:  GI bleeding.   ANESTHESIA:  Demerol 50, Versed 5 mg.   DESCRIPTION OF PROCEDURE:  With the patient mildly sedated in the left  lateral decubitus position, the Olympus videoscopic endoscope was inserted  in the mouth and passed under direct vision through the esophagus which  appeared normal until we reached the distal esophagus and there was a  question of Barrett's photographed and biopsied. We entered into the  stomach. The fundus, body, antrum, duodenal bulb, and second portion of  duodenum appeared normal. From this point, the endoscope was slowly  withdrawn taking circumferential views of the duodenal mucosa until the  endoscope was then pulled back in the stomach placed in retroflexion to view  the stomach from below. The endoscope was straightened and withdrawn taking  circumferential views of the remaining gastric and esophageal mucosa. The  patient's vital signs and pulse oximeter remained stable. The patient  tolerated the procedure well without apparent complications.   FINDINGS:  Question of Barrett's esophagus biopsied. Await biopsy report.  The patient will call me for results and follow-up with me as an outpatient.           ______________________________  Georgiana Spinner, M.D.     GMO/MEDQ  D:  02/17/2005  T:  02/17/2005  Job:  [id number]

## 2010-11-06 NOTE — Op Note (Signed)
   NAMELINDELL, [name]                           ACCOUNT NO.:  [id number]   MEDICAL RECORD NO.:  [id number]                   PATIENT TYPE:  AMB   LOCATION:  DSC                                  FACILITY:  [facility]   PHYSICIAN:  Ollen Gross. Vernell Morgans, M.D.              DATE OF BIRTH:  [birth year]   DATE OF PROCEDURE:  05/02/2002  DATE OF DISCHARGE:                                 OPERATIVE REPORT   PREOPERATIVE DIAGNOSIS:  Small lipoma of the scalp.   POSTOPERATIVE DIAGNOSIS:  Small lipoma of the scalp.   OPERATION PERFORMED:  Excision of scalp lesion.   SURGEON:  Ollen Gross. Carolynne Edouard, M.D.   ANESTHESIA:  Local.   DESCRIPTION OF PROCEDURE:  After informed consent was obtained, the patient  was brought to the operating room and placed in the prone position on the  operating table.  The patient's hair overlying the area was shaved and the  area was prepped with Betadine and draped in the usual sterile manner.  The  area in question was infiltrated with 1% lidocaine with epinephrine.  Next,  a small incision was made overlying the mass in question and blunt and sharp  dissection was then carried out in this area until the deeper layers of the  subcutaneous tissues in the scalp were encountered.  The mass felt very deep  on the skull itself and it was very difficult to get down to this area.  Several large bleeding vessels of the scalp were encountered which required  suture ligature.  Because of the bleeding from the scalp, it was decided  that it would be best not to proceed with any deeper dissection, given that  this area was very small.  The incision was then closed with large bites of  running 3-0 nylon stitch.  A Dermabond dressing was then applied.  The wound  was hemostatic at the end.  The patient tolerated the procedure well.  At  the end of the case all sponge, needle and instrument counts were correct.  The patient was then taken to the recovery room in stable condition.                                         Ollen Gross. Vernell Morgans, M.D.    PST/MEDQ  D:  05/02/2002  T:  05/02/2002  Job:  [id number]

## 2011-02-10 ENCOUNTER — Other Ambulatory Visit (HOSPITAL_COMMUNITY): Payer: Self-pay | Admitting: Internal Medicine

## 2011-02-15 ENCOUNTER — Other Ambulatory Visit (HOSPITAL_COMMUNITY): Payer: Self-pay | Admitting: Gastroenterology

## 2011-02-17 ENCOUNTER — Other Ambulatory Visit (HOSPITAL_COMMUNITY): Payer: Self-pay

## 2011-02-17 ENCOUNTER — Ambulatory Visit (HOSPITAL_COMMUNITY): Payer: Self-pay

## 2011-02-18 ENCOUNTER — Ambulatory Visit (HOSPITAL_COMMUNITY): Payer: Self-pay

## 2011-02-18 ENCOUNTER — Other Ambulatory Visit (HOSPITAL_COMMUNITY): Payer: Self-pay

## 2011-02-19 ENCOUNTER — Ambulatory Visit (HOSPITAL_COMMUNITY)
Admission: RE | Admit: 2011-02-19 | Discharge: 2011-02-19 | Disposition: A | Discharge status: Home / Self Care | Payer: Medicare Other | Source: Ambulatory Visit | Attending: Gastroenterology | Admitting: Gastroenterology

## 2011-02-19 DIAGNOSIS — R1310 Dysphagia, unspecified: Secondary | ICD-10-CM | POA: Insufficient documentation

## 2011-02-19 DIAGNOSIS — K224 Dyskinesia of esophagus: Secondary | ICD-10-CM | POA: Insufficient documentation

## 2011-02-24 ENCOUNTER — Emergency Department (HOSPITAL_COMMUNITY): Payer: Medicare Other

## 2011-02-24 ENCOUNTER — Inpatient Hospital Stay (HOSPITAL_COMMUNITY)
Admission: EM | Admit: 2011-02-24 | Discharge: 2011-02-28 | DRG: 193 | Disposition: A | Discharge status: Home / Self Care | Payer: Medicare Other | Attending: Internal Medicine | Admitting: Internal Medicine

## 2011-02-24 DIAGNOSIS — D72829 Elevated white blood cell count, unspecified: Secondary | ICD-10-CM | POA: Diagnosis present

## 2011-02-24 DIAGNOSIS — D649 Anemia, unspecified: Secondary | ICD-10-CM | POA: Diagnosis present

## 2011-02-24 DIAGNOSIS — Z7982 Long term (current) use of aspirin: Secondary | ICD-10-CM

## 2011-02-24 DIAGNOSIS — J189 Pneumonia, unspecified organism: Principal | ICD-10-CM | POA: Diagnosis present

## 2011-02-24 DIAGNOSIS — R7401 Elevation of levels of liver transaminase levels: Secondary | ICD-10-CM | POA: Diagnosis present

## 2011-02-24 DIAGNOSIS — I2510 Atherosclerotic heart disease of native coronary artery without angina pectoris: Secondary | ICD-10-CM | POA: Diagnosis present

## 2011-02-24 DIAGNOSIS — R1319 Other dysphagia: Secondary | ICD-10-CM | POA: Diagnosis present

## 2011-02-24 DIAGNOSIS — R339 Retention of urine, unspecified: Secondary | ICD-10-CM | POA: Diagnosis present

## 2011-02-24 DIAGNOSIS — D696 Thrombocytopenia, unspecified: Secondary | ICD-10-CM | POA: Diagnosis present

## 2011-02-24 DIAGNOSIS — I129 Hypertensive chronic kidney disease with stage 1 through stage 4 chronic kidney disease, or unspecified chronic kidney disease: Secondary | ICD-10-CM | POA: Diagnosis present

## 2011-02-24 DIAGNOSIS — I509 Heart failure, unspecified: Secondary | ICD-10-CM | POA: Diagnosis present

## 2011-02-24 DIAGNOSIS — M549 Dorsalgia, unspecified: Secondary | ICD-10-CM | POA: Diagnosis present

## 2011-02-24 DIAGNOSIS — I214 Non-ST elevation (NSTEMI) myocardial infarction: Secondary | ICD-10-CM | POA: Diagnosis present

## 2011-02-24 DIAGNOSIS — G8929 Other chronic pain: Secondary | ICD-10-CM | POA: Diagnosis present

## 2011-02-24 DIAGNOSIS — K219 Gastro-esophageal reflux disease without esophagitis: Secondary | ICD-10-CM | POA: Diagnosis present

## 2011-02-24 DIAGNOSIS — R7402 Elevation of levels of lactic acid dehydrogenase (LDH): Secondary | ICD-10-CM | POA: Diagnosis present

## 2011-02-24 DIAGNOSIS — N401 Enlarged prostate with lower urinary tract symptoms: Secondary | ICD-10-CM | POA: Diagnosis present

## 2011-02-24 DIAGNOSIS — E119 Type 2 diabetes mellitus without complications: Secondary | ICD-10-CM | POA: Diagnosis present

## 2011-02-24 DIAGNOSIS — N138 Other obstructive and reflux uropathy: Secondary | ICD-10-CM | POA: Diagnosis present

## 2011-02-24 DIAGNOSIS — N183 Chronic kidney disease, stage 3 unspecified: Secondary | ICD-10-CM | POA: Diagnosis present

## 2011-02-24 DIAGNOSIS — Z79899 Other long term (current) drug therapy: Secondary | ICD-10-CM

## 2011-02-24 DIAGNOSIS — I679 Cerebrovascular disease, unspecified: Secondary | ICD-10-CM | POA: Diagnosis present

## 2011-02-24 DIAGNOSIS — K224 Dyskinesia of esophagus: Secondary | ICD-10-CM | POA: Diagnosis present

## 2011-02-24 DIAGNOSIS — I739 Peripheral vascular disease, unspecified: Secondary | ICD-10-CM | POA: Diagnosis present

## 2011-02-24 DIAGNOSIS — I959 Hypotension, unspecified: Secondary | ICD-10-CM | POA: Diagnosis present

## 2011-02-24 LAB — CK TOTAL AND CKMB (NOT AT ARMC)
CK, MB: 3.5 ng/mL (ref 0.3–4.0)
Total CK: 85 U/L (ref 7–232)

## 2011-02-24 LAB — COMPREHENSIVE METABOLIC PANEL
CO2: 28 mEq/L (ref 19–32)
Chloride: 98 mEq/L (ref 96–112)
GFR calc Af Amer: 40 mL/min — ABNORMAL LOW (ref >60)
Potassium: 3.7 mEq/L (ref 3.5–5.1)
Sodium: 136 mEq/L (ref 135–145)

## 2011-02-24 LAB — LIPID PANEL
HDL: 30 mg/dL — ABNORMAL LOW (ref >39)
LDL Cholesterol: 26 mg/dL (ref 0–99)

## 2011-02-24 LAB — URINALYSIS, ROUTINE W REFLEX MICROSCOPIC
Bilirubin Urine: NEGATIVE
Leukocytes, UA: NEGATIVE
Nitrite: NEGATIVE
Specific Gravity, Urine: 1.012 (ref 1.005–1.030)
Urobilinogen, UA: 1 mg/dL (ref 0.0–1.0)

## 2011-02-24 LAB — CBC
Hemoglobin: 10.8 g/dL — ABNORMAL LOW (ref 13.0–17.0)
Platelets: 165 10*3/uL (ref 150–400)
RBC: 3.8 MIL/uL — ABNORMAL LOW (ref 4.22–5.81)
WBC: 21.7 10*3/uL — ABNORMAL HIGH (ref 4.0–10.5)

## 2011-02-24 LAB — DIFFERENTIAL
Basophils Relative: 0 % (ref 0–1)
Eosinophils Absolute: 0 10*3/uL (ref 0.0–0.7)
Monocytes Relative: 8 % (ref 3–12)
Neutro Abs: 18.8 10*3/uL — ABNORMAL HIGH (ref 1.7–7.7)
Neutrophils Relative %: 87 % — ABNORMAL HIGH (ref 43–77)

## 2011-02-24 LAB — PRO B NATRIURETIC PEPTIDE: Pro B Natriuretic peptide (BNP): 1932 pg/mL — ABNORMAL HIGH (ref 0–450)

## 2011-02-24 LAB — PHOSPHORUS: Phosphorus: 2.6 mg/dL (ref 2.3–4.6)

## 2011-02-24 LAB — TROPONIN I: Troponin I: 0.67 ng/mL (ref <0.30)

## 2011-02-24 LAB — CARDIAC PANEL(CRET KIN+CKTOT+MB+TROPI): Troponin I: 0.73 ng/mL (ref <0.30)

## 2011-02-24 LAB — GLUCOSE, CAPILLARY

## 2011-02-25 LAB — COMPREHENSIVE METABOLIC PANEL
AST: 15 U/L (ref 0–37)
Albumin: 2.4 g/dL — ABNORMAL LOW (ref 3.5–5.2)
Alkaline Phosphatase: 49 U/L (ref 39–117)
Chloride: 103 mEq/L (ref 96–112)
Potassium: 3.9 mEq/L (ref 3.5–5.1)
Sodium: 138 mEq/L (ref 135–145)
Total Bilirubin: 0.5 mg/dL (ref 0.3–1.2)

## 2011-02-25 LAB — DIFFERENTIAL
Basophils Absolute: 0 10*3/uL (ref 0.0–0.1)
Eosinophils Absolute: 0 10*3/uL (ref 0.0–0.7)
Eosinophils Relative: 0 % (ref 0–5)
Lymphocytes Relative: 9 % — ABNORMAL LOW (ref 12–46)

## 2011-02-25 LAB — URINE CULTURE: Colony Count: NO GROWTH

## 2011-02-25 LAB — CBC
HCT: 28.4 % — ABNORMAL LOW (ref 39.0–52.0)
Platelets: 133 10*3/uL — ABNORMAL LOW (ref 150–400)
RDW: 14.7 % (ref 11.5–15.5)
WBC: 15.1 10*3/uL — ABNORMAL HIGH (ref 4.0–10.5)

## 2011-02-25 LAB — GLUCOSE, CAPILLARY
Glucose-Capillary: 147 mg/dL — ABNORMAL HIGH (ref 70–99)
Glucose-Capillary: 159 mg/dL — ABNORMAL HIGH (ref 70–99)

## 2011-02-25 LAB — HEMOGLOBIN A1C
Hgb A1c MFr Bld: 6.1 % — ABNORMAL HIGH (ref <5.7)
Mean Plasma Glucose: 128 mg/dL — ABNORMAL HIGH (ref <117)

## 2011-02-25 LAB — CARDIAC PANEL(CRET KIN+CKTOT+MB+TROPI): Relative Index: 3.1 — ABNORMAL HIGH (ref 0.0–2.5)

## 2011-02-25 LAB — TSH: TSH: 1.235 u[IU]/mL (ref 0.350–4.500)

## 2011-02-26 LAB — BASIC METABOLIC PANEL
CO2: 25 mEq/L (ref 19–32)
Chloride: 106 mEq/L (ref 96–112)
Creatinine, Ser: 1.42 mg/dL — ABNORMAL HIGH (ref 0.50–1.35)
Potassium: 3.8 mEq/L (ref 3.5–5.1)

## 2011-02-26 LAB — CBC
MCV: 85.9 fL (ref 78.0–100.0)
Platelets: 115 10*3/uL — ABNORMAL LOW (ref 150–400)
RBC: 3.05 MIL/uL — ABNORMAL LOW (ref 4.22–5.81)
WBC: 8 10*3/uL (ref 4.0–10.5)

## 2011-02-26 LAB — GLUCOSE, CAPILLARY
Glucose-Capillary: 112 mg/dL — ABNORMAL HIGH (ref 70–99)
Glucose-Capillary: 140 mg/dL — ABNORMAL HIGH (ref 70–99)
Glucose-Capillary: 143 mg/dL — ABNORMAL HIGH (ref 70–99)

## 2011-02-27 LAB — COMPREHENSIVE METABOLIC PANEL
ALT: 333 U/L — ABNORMAL HIGH (ref 0–53)
Alkaline Phosphatase: 274 U/L — ABNORMAL HIGH (ref 39–117)
BUN: 16 mg/dL (ref 6–23)
CO2: 25 mEq/L (ref 19–32)
Chloride: 106 mEq/L (ref 96–112)
GFR calc Af Amer: >60 mL/min (ref >60)
Glucose, Bld: 104 mg/dL — ABNORMAL HIGH (ref 70–99)
Potassium: 4.2 mEq/L (ref 3.5–5.1)
Sodium: 139 mEq/L (ref 135–145)
Total Bilirubin: 0.6 mg/dL (ref 0.3–1.2)

## 2011-02-27 LAB — CROSSMATCH
ABO/RH(D): O NEG
Antibody Screen: NEGATIVE
Unit division: 0

## 2011-02-27 LAB — DIFFERENTIAL
Basophils Absolute: 0 10*3/uL (ref 0.0–0.1)
Eosinophils Relative: 4 % (ref 0–5)
Lymphocytes Relative: 18 % (ref 12–46)
Lymphs Abs: 1.1 10*3/uL (ref 0.7–4.0)
Neutro Abs: 4.1 10*3/uL (ref 1.7–7.7)

## 2011-02-27 LAB — CBC
HCT: 32.2 % — ABNORMAL LOW (ref 39.0–52.0)
Hemoglobin: 10.6 g/dL — ABNORMAL LOW (ref 13.0–17.0)
MCV: 86.3 fL (ref 78.0–100.0)
RBC: 3.73 MIL/uL — ABNORMAL LOW (ref 4.22–5.81)
RDW: 14.4 % (ref 11.5–15.5)
WBC: 6.3 10*3/uL (ref 4.0–10.5)

## 2011-02-27 LAB — GLUCOSE, CAPILLARY
Glucose-Capillary: 108 mg/dL — ABNORMAL HIGH (ref 70–99)
Glucose-Capillary: 132 mg/dL — ABNORMAL HIGH (ref 70–99)

## 2011-02-27 NOTE — Discharge Summary (Signed)
[name], [name] NO.:  [id number]  MEDICAL RECORD NO.:  [id number]  LOCATION:  1409                         FACILITY:  [facility]  PHYSICIAN:  Talmage Nap, MD  DATE OF BIRTH:  05/09/1921  DATE OF ADMISSION:  02/24/2011 DATE OF DISCHARGE:  02/28/2011                        DISCHARGE SUMMARY - REFERRING   PRIMARY CARE PHYSICIAN:  Soyla Murphy. Renne Crigler, M.D.  PRIMARY NEPHROLOGIST:  Duke Salvia. Eliott Nine, M.D.  PRIMARY CARDIOLOGIST:  Georga Hacking, M.D.  CONSULTANTS INVOLVED IN THE CASE.:  Cardiology,  Dr. Lyn Records.  DISCHARGE DIAGNOSES: 1. Community acquired pneumonia. 2. Questionable non-ST elevated MI - medically managed. 3. Anemia status post packed RBC transfusion. 4. History of coronary artery disease. 5. History of congestive heart failure, not otherwise specified. 6. Hypertension. 7. Diabetes mellitus. 8. Gastroesophageal reflux disease. 9. History of cerebrovascular accident, no new deficits. 10.Benign prostatic hypertrophy. 11.Thrombocytopenia. 12.Abnormal liver function tests, questionable secondary to     congestion.  SUBJECTIVE:  The patient is an 77 year old Caucasian male with history of coronary artery disease and congestive heart failure, not otherwise specified, was admitted to the hospital on February 24, 2011, by Dr. Richarda Overlie, with complaint of weakness and cough.  Cough was said to be productive of sputum.  Color of sputum not documented in the initial H and P.  The patient was also said to have had a low-grade fever.  He denied any history of chest pain.  He denied any shortness of breath. In the ED, the patient was found to be hypotensive, had leukocytosis with left shift and elevated troponin, and subsequently admitted for stabilization.  MEDICATIONS:  Preadmission medicines without dosages include: 1. Lasix. 2. Endocet. 3. Coreg. 4. Remeron. 5. Omeprazole. 6. Crestor. 7. Multivitamin. 8. Aspirin. 9.  Iron. 10.Calcium. 11.Fish oil. 12.Finasteride. 13.Januvia. 14.Norvasc. 15.Flomax. 16.Losartan.  ALLERGIES:  AMOXICILLIN, HYDRALAZINE and BIAXIN.  PAST SURGICAL HISTORY: 1. Right shoulder surgery. 2. Lower back surgery. 3. Peripheral artery disease status post fem-pop bypass.  SOCIAL HISTORY:  Social history and review of systems essentially documented in the initial history and physical.  PHYSICAL EXAMINATION:  At time the patient was seen by the admitting physician: VITAL SIGNS:  Blood pressure was 104/64, pulse 67, respiratory rate 18, temperature was 99.1. GENERAL:  The patient was said to be comfortable, not in any distress. HEENT:  Pupils were reactive to light and extraocular muscles are intact. NECK:  No jugular venous distention.  No carotid bruit.  No lymphadenopathy. CHEST:  Showed crackles at the right lung base. HEART:  Sounds are 1 and 2. ABDOMEN:  Soft, nontender.  Liver, spleen tip not palpable.  Bowel sounds are positive. EXTREMITIES:  No pedal edema. NEUROLOGIC:  Nonfocal. MUSCULOSKELETAL SYSTEM:  Show arthritic changes in the knees and feet. NEUROPSYCHIATRIC EVALUATION:  Unremarkable.  LAB DATA:  Initial complete blood count with differential showed WBC of 21.7, hemoglobin of 10.8, hematocrit of 32.7, MCV 86.1, platelet count of 165, neutrophil 87% and absolute neutrophil count is 18.8. Comprehensive metabolic panel showed sodium of 136, potassium of 3.7, chloride of 90 with a bicarb of 28, glucose is 133, BUN is 31, creatinine is 1.94.  LFT, normal and cardiac markers, troponin-I  0.67, 0.73, 0.71, 0.65, all elevated.  CK-MB 3.5, 3.33, 3.0, 3.5.  Urinalysis unremarkable.  Magnesium level is 2.1, phosphorus is 2.6.  Pro-BNP is 1932.  Lipid profile showed total cholesterol 71, triglycerides 74, HDL cholesterol 30, LDL cholesterol 26, TSH 1.235 normal.  A repeat CBC with differential done on February 25, 2011 showed WBC of 15.1, hemoglobin of 9.3,  hematocrit of 28.4, MCV 85.8, platelet count of 133, neutrophils 83%, absolute neutrophil count is 12.4.  Comprehensive metabolic panel showed sodium of 138, potassium of 3.9, chloride of 102, with a bicarb of 27, glucose is 01/01, BUN is 33, creatinine is 1.80, and hemoglobin A1c 6.1.  Urine culture, no growth.  Blood cultures x2, no growth. Repeat complete blood count with no differential done on February 26, 2011, showed WBC of 8.0, hemoglobin of 8.8, hematocrit of 26.2, MCV of 85.9, with a platelet count of 0.15, and a repeat CBC with differential done on February 27, 2011, showed WBC of 6.3, hemoglobin of 10.6, hematocrit 32.2, MCV of 83.3, with a platelet count of 132.  Magnesium level is 2.2.  Comprehensive metabolic panel showed sodium of 139, potassium of 4.2, chloride of 106, bicarb of 25, glucose is 104, BUN is 16, creatinine is 1.14.  LFTs showed total bilirubin of 0.6, alkaline phosphatase is 274, AST 345, ALT 333.  Imaging studies done include a chest x-ray which showed opacification in the periphery of the right midlung as well as in right lung base, suspicious for pneumonia.  A 2-D echo showed moderate LVH, EF of 60-65%, no regional wall motionabnormalities seen.  HOSPITAL COURSE:  The patient was admitted to telemetry with a question of community acquired pneumonia, started on normal saline IV to go to rate of 75 cc an hour.  He was also started on Avelox 100 mg IV q. 12 hourly.  The patient was put on aspiration precautions.  Other medication given to the patient include Tylenol 650 mg p.o. q.4 h. p.r.n. and albuterol nebulizer q.6 hourly.  He was also started on carvedilol 3.25 mg p.o. b.i.d., finasteride 5 mg p.o. daily.  He was placed on Accu-Cheks with regular insulin sliding scale.  Also, given to the patient was Norvasc 5 mg p.o. daily.  The patient was however evaluated by the in-house cardiologist because of the elevated troponin and he recommended that the  patient should be given Lovenox prophylactically.  The cardiologist was not convinced whether the elevated troponin equates to a non-ST MI; however, the patient was seen by me for the very first time in this admission on February 25, 2011, and during my encounter, examination showed the patient to have minimal bibasilar rales and at this point, Lovenox was changed to 1 mg per kg subcu q. 24 hourly.  The patient was evaluated by me alongside with the nurse practitioner, D Toya Smothers and also Cordelia Pen.  The patient was subsequently followed and evaluated by me on a daily basis and made he remarkable progress.  He was found to have dropped his hemoglobin to about 8.8 g.  He was typed and crossed, transfused with 2 units of packed RBCs and post transfer H and H had remained fairly stable at about 10.6 g.  So far, the patient has made remarkable progress.  He was able to ambulate within the room and move from his bed to his chair.  He was seen by me today, 02/27/2011, feels better, wants to go home, denied any chest pain or shortness of breath.  Examination showed minimal bibasilar crackles.  Vital signs, blood pressure is 138/60, pulse 60, respiratory rate 16, temperature is 98.6, medically stable.  PLAN:  Plan is for the patient to be discharged home today on activity as tolerated, daily weights, fluid restriction.  He was advised to watch out for signs and symptoms of congestive heart failure which includes congestion in the lungs, shortness of breath, swelling of the lower extremity.  Should he develop any of these symptoms, he was advised to return to the emergency room to be evaluated.  Medication to be taken at home will include the following: 1. Aspirin 325 mg p.o. daily. 2. Avelox 400 mg p.o. daily for the next 7 days. 3. Enteric-coated calcium with vitamin D 600 1 tablet p.o. b.i.d. 4. Carvedilol 25 mg p.o. b.i.d. 5. Crestor 20 mg p.o. daily. 6. Dexamethasone 0.25%,  applied topically to the legs. 7. Endocet, that is hydrocodone/APAP 7.5/325 one p.o. q. 6 h. p.r.n. 8. Finasteride 5 mg p.o. daily. 9. Fish oil over-the-counter 1-2 tablets p.o. b.i.d. 10.Furosemide 40 mg p.o. b.i.d. 11.Januvia 100 mg half a tablet p.o. daily. 12.Losartan, that is Cozaar 50 mg one p.o. daily. 13.MiraLax (polyethylene glycol) 17 g one p.o. daily. 14.Mirtazapine 30 mg half a tablet p.o. daily at bedtime. 15.Multivitamin one p.o. daily. 16.Norvasc (amlodipine 5 mg p.o. daily). 17.Omeprazole 20 mg one p.o. daily. 18.Tamsulosin, Flomax 0.5 mg p.o. daily. 19.Zaleplon 5 mg one p.o. daily at bedtime.  A copy of this discharge summary made available to the patient's primary care physician.     Talmage Nap, MD     CN/MEDQ  D:  02/27/2011  T:  02/27/2011  Job:  [id number]  Electronically Signed by Talmage Nap  on 02/27/2011 07:59:25 PM

## 2011-02-28 NOTE — Consult Note (Signed)
NAMERILEE, WENDLING NO.:  [id number]  MEDICAL RECORD NO.:  [id number]  LOCATION:  1409                         FACILITY:  [facility]  PHYSICIAN:  Lyn Records, M.D.   DATE OF BIRTH:  [birth year]  DATE OF CONSULTATION:  02/24/2011 DATE OF DISCHARGE:                                CONSULTATION   CONCLUSIONS: 1. Elevated troponin of doubtful clinical significance in this     situation. 2. Pneumonia documented by chest x-ray, leukocytosis, and low-grade     fever. 3. Coronary atherosclerotic heart disease.     a.     Details unknown. 4. Congestive heart failure.     a.     LVEF unknown. 5. Peripheral vascular disease involving lower extremities and renal     arteries. 6. Diabetes mellitus. 7. History of anemia. 8. History of gastrointestinal bleeding.  RECOMMENDATIONS: 1. Did not do further troponin or cardiac ischemia marker evaluation,     unless clinically indicated. 2. Check BNP. 3. Get details concerning the patient's cardiac history from Dr.     Donnie Aho if available including recent LVEF and details concerning     coronary disease. 4. The patient appears to be mildly dehydrated and would recommend     gentle hydration. 5. IV antibiotics as indicated for his pneumonia.  COMMENT:  The patient is elderly, 77 years of age, he is admitted to the hospital with weakness.  Chest x-ray and blood tests seem to suggest that he has pneumonia.  He is much better now after some IV fluid and eating than he was when first came in.  We are consulted because of an elevated troponin-I.  The patient has had no chest pain.  He denies dyspnea.  He has had no symptoms similar to prior cardiac complaints.  ALLERGIES:  Amoxicillin, hydralazine, and Biaxin.  MEDICATIONS:  On admission, his medications include Lasix, Endocet, Coreg, Remeron, omeprazole, Crestor, multiple vitamins, aspirin, iron, calcium, fish oil, finasteride, Januvia, Norvasc, Flomax, and  losartan.  PHYSICAL EXAMINATION:  GENERAL:  The patient is lying flat in bed in no acute distress. VITAL SIGNS:  His blood pressure is 130/70, heart rate is 70.  No obvious JVD is noted with the patient at 30 degrees. CARDIAC EXAM:  Reveals a great 2/6 systolic murmur at right upper sternal border and left midsternal border. LUNGS:  Clear to auscultation and percussion. ABDOMEN:  Soft.  Bowel sounds are normal. EXTREMITIES:  Reveal no edema.  EKG is essentially normal with nonspecific T-wave flattening.  Chest x- ray reveals right middle and lower lobe infiltrates.  Other laboratory data includes a white blood cell count of 21,700, hemoglobin of 10.8, creatinine of 1.94, BUN of 31, potassium of 3.7.  His troponin-I is 0.67.  CK-MB are 85 and 3.5 respectively.  DISCUSSION:  The patient has no evidence of ongoing ischemia or acute coronary syndrome.  I would treat the patient's pneumonia and not do further ischemic evaluation.  I will inform Dr. Donnie Aho of the patient's presence.     Lyn Records, M.D.     HWS/MEDQ  D:  02/24/2011  T:  02/25/2011  Job:  [id number]  cc:   W.  Viann Fish, M.D. Fax: [phone] Email: [email or web address]  Electronically Signed by Verdis Prime M.D. on 02/28/2011 08:11:10 PM

## 2011-03-03 LAB — CULTURE, BLOOD (ROUTINE X 2)
Culture  Setup Time: 201209060136
Culture: NO GROWTH

## 2011-03-12 ENCOUNTER — Ambulatory Visit (HOSPITAL_COMMUNITY)
Admission: RE | Admit: 2011-03-12 | Discharge: 2011-03-12 | Disposition: A | Discharge status: Home / Self Care | Payer: Medicare Other | Source: Ambulatory Visit | Attending: Gastroenterology | Admitting: Gastroenterology

## 2011-03-12 DIAGNOSIS — I10 Essential (primary) hypertension: Secondary | ICD-10-CM | POA: Insufficient documentation

## 2011-03-12 DIAGNOSIS — R1310 Dysphagia, unspecified: Secondary | ICD-10-CM | POA: Insufficient documentation

## 2011-03-12 DIAGNOSIS — K2970 Gastritis, unspecified, without bleeding: Secondary | ICD-10-CM | POA: Insufficient documentation

## 2011-03-12 DIAGNOSIS — R634 Abnormal weight loss: Secondary | ICD-10-CM | POA: Insufficient documentation

## 2011-03-12 DIAGNOSIS — K2990 Gastroduodenitis, unspecified, without bleeding: Secondary | ICD-10-CM | POA: Insufficient documentation

## 2011-03-12 DIAGNOSIS — Z79899 Other long term (current) drug therapy: Secondary | ICD-10-CM | POA: Insufficient documentation

## 2011-03-29 NOTE — H&P (Signed)
NAMEFROILAN, [name] NO.:  [id number]  MEDICAL RECORD NO.:  [id number]  LOCATION:  WLED                         FACILITY:  [facility]  PHYSICIAN:  Richarda Overlie, MD       DATE OF BIRTH:  03-06-1921  DATE OF ADMISSION:  02/24/2011 DATE OF DISCHARGE:                             HISTORY & PHYSICAL   PRIMARY CARE PHYSICIAN:  Soyla Murphy. Renne Crigler, M.D.  PRIMARY NEPHROLOGIST:  Duke Salvia. Eliott Nine, M.D.  PRIMARY CARDIOLOGIST:  Georga Hacking, M.D.  CHIEF COMPLAINT:  Weakness.  SUBJECTIVE:  77 year old male who presents to the ER with a chief complaint of generalized weakness and not being able to get out of bed for several hours this morning.  The patient has recently been evaluated for dysphagia and had a barium swallow done on February 19, 2011, that showed nonspecific esophageal dysmotility disorder with destruction of the primary peristaltic waves at the aortic arch, considerable tertiary contractions but no obvious stricture or esophageal narrowing.  The patient was evaluated by Dr. Elnoria Howard, who had ordered the barium swallow and was supposed to have an EGD sometime this week.  The patient also saw his nephrologist yesterday for chronic kidney disease and has been coughing with a productive cough last night.  The patient also apparently had a low-grade fever at home.  In the ER, the patient was found to be hypotensive with a low-grade fever of 99.1, significant leukocytosis, and a pneumonia on the right side of his chest x-ray.  He denied any urinary urgency, frequency, or dysuria.  The patient denied any chest pain or shortness of breath per se, but was found to have an elevated troponin in the ER and therefore he is being admitted for the all of the above reasons.  PAST MEDICAL HISTORY:  Significant for, 1. Cerebrovascular disease. 2. Coronary artery disease. 3. Diabetes. 4. Hypertension. 5. Anemia. 6. Gastrointestinal bleeding. 7. History of congestive heart  failure.  PAST SURGICAL HISTORY: 1. History of surgery on the right shoulder. 2. Multiple low back surgeries. 3. History of fem-pop bypass grafting performed by Dr. Arbie Cookey in 1988     and 1999.  ALLERGIES:  To AMOXICILLIN, HYDRALAZINE, BIAXIN.  CURRENT MEDICATIONS:  Lasix, Endocet, Coreg, Remeron, omeprazole, Crestor, multivitamin, aspirin, iron, calcium, fish oil, finasteride, Januvia, Norvasc, Flomax, losartan.  REVIEW OF SYSTEMS:  10-point review of systems was done as documented in HPI.  PHYSICAL EXAMINATION:  VITAL SIGNS: Blood pressure was 104/64, pulse 67, respirations 18, temperature 99.1. GENERAL:  Comfortable, currently in no acute cardiopulmonary distress. HEENT:  Pupils equal and reactive.  Extraocular movements intact. NECK:  Supple.  No JVD. LUNGS:  Bibasilar wheezing, especially crackles at the right base compared to the left side. CARDIOVASCULAR:  Regular rate and rhythm.  No murmurs, rubs, or gallops. ABDOMEN: Soft, nontender, nondistended. EXTREMITIES:  Without cyanosis, clubbing, or edema. NEUROLOGIC:  Cranial nerves II-XII grossly intact. PSYCHIATRIC:  Appropriate mood and affect.  LABORATORY DATA:  WBC 21.7, hemoglobin 10.8, hematocrit 32.7, and a platelet count of 165.  Sodium 136, potassium 3.7, chloride 98, bicarb 28, glucose 133, BUN 31, creatinine 1.94, total bilirubin 0.3, AST 19, ALT 8, calcium 8.5, troponin 0.67.  Urinalysis negative.  CK-MB is 3.5.  EKG shows normal sinus rhythm with a rate of 77 beats per minute, nonspecific ST-T segment changes.  ASSESSMENT AND PLAN: 1. Community-acquired versus aspiration pneumonia. 2. Esophageal dysmotility disorder based on barium swallow, pending     esophagogastroduodenoscopy. 3. Stage 3 chronic kidney disease with acute-on-chronic renal     insufficiency. 4. Coronary artery disease with non-ST elevation myocardial     infarction. 5. Gastroesophageal reflux disease. 6. Hypertension. 7. Chronic back  pain. 8. Chronic anemia. 9. BPH. 10.Diabetes.  PLAN:  The patient will be admitted to Telemetry.  We will hydrate him aggressively with IV fluids.  We will obtain blood cultures.  We will start him on Avelox for his pneumonia.  Dr. Verdis Prime has been consulted for his non-ST elevation MI.  We will continue him on aspirin and hold his other antihypertensive medications with the exception of Coreg.  We will obtain a 2-D echo to rule out wall motion abnormalities. We will obtain a PT, OT consultation because of his weakness which is most likely secondary to his pneumonia.  He is a full code.     Richarda Overlie, MD     NA/MEDQ  D:  02/24/2011  T:  02/24/2011  Job:  [id number]  Electronically Signed by Richarda Overlie MD on 03/29/2011 02:14:26 PM

## 2011-07-23 ENCOUNTER — Emergency Department (HOSPITAL_COMMUNITY): Payer: Medicare Other

## 2011-07-23 ENCOUNTER — Emergency Department (HOSPITAL_COMMUNITY)
Admission: EM | Admit: 2011-07-23 | Discharge: 2011-07-24 | Disposition: A | Discharge status: Home / Self Care | Payer: Medicare Other | Attending: Emergency Medicine | Admitting: Emergency Medicine

## 2011-07-23 ENCOUNTER — Encounter (HOSPITAL_COMMUNITY): Payer: Self-pay | Admitting: Family Medicine

## 2011-07-23 DIAGNOSIS — R141 Gas pain: Secondary | ICD-10-CM | POA: Insufficient documentation

## 2011-07-23 DIAGNOSIS — E119 Type 2 diabetes mellitus without complications: Secondary | ICD-10-CM | POA: Insufficient documentation

## 2011-07-23 DIAGNOSIS — N289 Disorder of kidney and ureter, unspecified: Secondary | ICD-10-CM

## 2011-07-23 DIAGNOSIS — R10819 Abdominal tenderness, unspecified site: Secondary | ICD-10-CM | POA: Insufficient documentation

## 2011-07-23 DIAGNOSIS — I252 Old myocardial infarction: Secondary | ICD-10-CM | POA: Insufficient documentation

## 2011-07-23 DIAGNOSIS — R142 Eructation: Secondary | ICD-10-CM | POA: Insufficient documentation

## 2011-07-23 DIAGNOSIS — R109 Unspecified abdominal pain: Secondary | ICD-10-CM | POA: Insufficient documentation

## 2011-07-23 DIAGNOSIS — E860 Dehydration: Secondary | ICD-10-CM

## 2011-07-23 HISTORY — DX: Acute myocardial infarction, unspecified: I21.9

## 2011-07-23 LAB — URINALYSIS, MICROSCOPIC ONLY
Bilirubin Urine: NEGATIVE
Glucose, UA: NEGATIVE mg/dL
Hgb urine dipstick: NEGATIVE
Leukocytes, UA: NEGATIVE
Nitrite: NEGATIVE
Protein, ur: NEGATIVE mg/dL
Specific Gravity, Urine: 1.017 (ref 1.005–1.030)
Urine-Other: NONE SEEN
Urobilinogen, UA: 1 mg/dL (ref 0.0–1.0)
pH: 6.5 (ref 5.0–8.0)

## 2011-07-23 LAB — DIFFERENTIAL
Basophils Absolute: 0 K/uL (ref 0.0–0.1)
Basophils Relative: 0 % (ref 0–1)
Eosinophils Absolute: 0.1 K/uL (ref 0.0–0.7)
Eosinophils Relative: 1 % (ref 0–5)
Lymphocytes Relative: 12 % (ref 12–46)
Lymphs Abs: 1.3 10*3/uL (ref 0.7–4.0)
Monocytes Absolute: 1.3 10*3/uL — ABNORMAL HIGH (ref 0.1–1.0)
Monocytes Relative: 12 % (ref 3–12)
Neutro Abs: 8.6 10*3/uL — ABNORMAL HIGH (ref 1.7–7.7)
Neutrophils Relative %: 76 % (ref 43–77)

## 2011-07-23 LAB — COMPREHENSIVE METABOLIC PANEL
ALT: 14 U/L (ref 0–53)
AST: 31 U/L (ref 0–37)
CO2: 27 mEq/L (ref 19–32)
Calcium: 8.8 mg/dL (ref 8.4–10.5)
Chloride: 95 mEq/L — ABNORMAL LOW (ref 96–112)
Creatinine, Ser: 2.44 mg/dL — ABNORMAL HIGH (ref 0.50–1.35)
GFR calc Af Amer: 26 mL/min — ABNORMAL LOW (ref >90)
GFR calc non Af Amer: 22 mL/min — ABNORMAL LOW (ref >90)
Glucose, Bld: 163 mg/dL — ABNORMAL HIGH (ref 70–99)
Sodium: 133 mEq/L — ABNORMAL LOW (ref 135–145)
Total Bilirubin: 0.3 mg/dL (ref 0.3–1.2)

## 2011-07-23 LAB — CBC
HCT: 31.9 % — ABNORMAL LOW (ref 39.0–52.0)
Hemoglobin: 10.2 g/dL — ABNORMAL LOW (ref 13.0–17.0)
MCH: 28.4 pg (ref 26.0–34.0)
MCHC: 32 g/dL (ref 30.0–36.0)
MCV: 88.9 fL (ref 78.0–100.0)
Platelets: 149 K/uL — ABNORMAL LOW (ref 150–400)
RBC: 3.59 MIL/uL — ABNORMAL LOW (ref 4.22–5.81)
RDW: 14.8 % (ref 11.5–15.5)
WBC: 11.4 10*3/uL — ABNORMAL HIGH (ref 4.0–10.5)

## 2011-07-23 LAB — COMPREHENSIVE METABOLIC PANEL WITH GFR
Albumin: 2.9 g/dL — ABNORMAL LOW (ref 3.5–5.2)
Alkaline Phosphatase: 54 U/L (ref 39–117)
BUN: 46 mg/dL — ABNORMAL HIGH (ref 6–23)
Potassium: 4.7 meq/L (ref 3.5–5.1)
Total Protein: 6.5 g/dL (ref 6.0–8.3)

## 2011-07-23 LAB — LIPASE, BLOOD: Lipase: 12 U/L (ref 11–59)

## 2011-07-23 MED ORDER — HYDROMORPHONE HCL PF 1 MG/ML IJ SOLN
INTRAMUSCULAR | Status: AC
  Administered 2011-07-23: 0.5 mg via INTRAVENOUS
  Filled 2011-07-23: qty 1

## 2011-07-23 MED ORDER — HYDROMORPHONE HCL PF 1 MG/ML IJ SOLN
INTRAMUSCULAR | Status: AC
  Administered 2011-07-23: 1 mg via INTRAVENOUS
  Filled 2011-07-23: qty 1

## 2011-07-23 MED ORDER — HYDROMORPHONE HCL PF 1 MG/ML IJ SOLN: 1.0000 mg | Freq: Once | INTRAMUSCULAR | Status: DC

## 2011-07-23 MED ORDER — LIDOCAINE HCL 2 % EX GEL: Freq: Once | CUTANEOUS | Status: DC

## 2011-07-23 MED ORDER — HYDROMORPHONE HCL PF 1 MG/ML IJ SOLN: 1.0000 mg | Freq: Once | INTRAMUSCULAR | Status: AC

## 2011-07-23 MED ORDER — LIDOCAINE HCL 2 % EX GEL
CUTANEOUS | Status: AC
  Filled 2011-07-23: qty 10

## 2011-07-23 MED ORDER — SODIUM CHLORIDE 0.9 % IV SOLN
Freq: Once | INTRAVENOUS | Status: AC
  Administered 2011-07-23: 19:00:00 via INTRAVENOUS

## 2011-07-23 MED ORDER — HYDROMORPHONE HCL PF 1 MG/ML IJ SOLN: 0.5000 mg | Freq: Once | INTRAMUSCULAR | Status: AC

## 2011-07-23 MED ORDER — SODIUM CHLORIDE 0.9 % IV BOLUS (SEPSIS)
500.0000 mL | Freq: Once | INTRAVENOUS | Status: AC
  Administered 2011-07-23: 20:00:00 via INTRAVENOUS

## 2011-07-23 NOTE — ED Notes (Signed)
Pt reports having difficulty urinating and some discomfort x1 week.

## 2011-07-23 NOTE — ED Provider Notes (Cosign Needed)
History     CSN: [id number]  Arrival date & time 07/23/11  1716   First MD Initiated Contact with Patient 07/23/11 1742      Chief Complaint  Patient presents with  . Dysuria    (Consider location/radiation/quality/duration/timing/severity/associated sxs/prior treatment) HPI Comments: Patient reports that he has had intermittent problems with his prostate for 20 years. Patient reports that he's had difficulty urinating with dribbling and some associated dysuria intermittently for the last week. Today he has had significant difficulty urinating and complains of feeling distention his abdomen with some associated discomfort, mostly in the suprapubic region. He denies fevers or chills, no nausea or vomiting. He denies any flank pain. He reports he has only dribbled a small amount of urine today. He denies history of prostate cancer, but has had 2 surgeries on his prostate by Dr. Annabell Howells in the past.    Patient is a 78 y.o. male presenting with dysuria. The history is provided by the patient and the spouse.  Dysuria  Associated symptoms include urgency. Pertinent negatives include no chills, no nausea, no vomiting, no frequency, no hematuria and no flank pain.    Past Medical History  Diagnosis Date  . MI (myocardial infarction)   . Kidney disease   . Diabetes mellitus     History reviewed. No pertinent past surgical history.  History reviewed. No pertinent family history.  History  Substance Use Topics  . Smoking status: Former Smoker    Quit date: 07/22/1972  . Smokeless tobacco: Not on file  . Alcohol Use: No      Review of Systems  Constitutional: Negative for fever, chills and appetite change.  Gastrointestinal: Positive for abdominal pain and abdominal distention. Negative for nausea, vomiting and diarrhea.  Genitourinary: Positive for dysuria, urgency, decreased urine volume and difficulty urinating. Negative for frequency, hematuria, flank pain, discharge, penile  swelling, scrotal swelling, penile pain and testicular pain.    Allergies  Amoxicillin; Apresoline esidrix; and Biaxin  Home Medications   Current Outpatient Rx  Name Route Sig Dispense Refill  . ASPIRIN EC 81 MG PO TBEC Oral Take 81 mg by mouth daily.    Marland Kitchen CALCIUM 600 + D PO Oral Take 2 tablets by mouth daily.    Marland Kitchen CARVEDILOL 25 MG PO TABS Oral Take 25 mg by mouth 2 (two) times daily with a meal.    . DESOXIMETASONE 0.25 % EX CREA Topical Apply 1 application topically 2 (two) times daily as needed.    Marland Kitchen FERROUS SULFATE 325 (65 FE) MG PO TABS Oral Take 325 mg by mouth daily with breakfast.    . FINASTERIDE 5 MG PO TABS Oral Take 5 mg by mouth daily.    . OMEGA-3 FATTY ACIDS 1000 MG PO CAPS Oral Take 3 g by mouth daily.    Marland Kitchen LOSARTAN POTASSIUM 100 MG PO TABS Oral Take 100 mg by mouth daily.    Marland Kitchen MIRTAZAPINE 30 MG PO TABS Oral Take 30 mg by mouth at bedtime.    . ADULT MULTIVITAMIN W/MINERALS CH Oral Take 1 tablet by mouth daily.    Marland Kitchen OMEPRAZOLE 20 MG PO CPDR Oral Take 20 mg by mouth daily.    . OXYCODONE-ACETAMINOPHEN 7.5-325 MG PO TABS Oral Take 1 tablet by mouth every 4 (four) hours as needed. pain    . POLYETHYLENE GLYCOL 3350 PO PACK Oral Take 17 g by mouth daily.    Marland Kitchen ROSUVASTATIN CALCIUM 20 MG PO TABS Oral Take 20 mg by mouth daily.    Marland Kitchen  SITAGLIPTIN PHOSPHATE 50 MG PO TABS Oral Take 50 mg by mouth daily.    Marland Kitchen SOLIFENACIN SUCCINATE 5 MG PO TABS Oral Take 10 mg by mouth daily.    Marland Kitchen TAMSULOSIN HCL 0.4 MG PO CAPS Oral Take 0.4 mg by mouth daily.    Marland Kitchen VITAMIN B-12 1000 MCG PO TABS Oral Take 1,000 mcg by mouth daily.    Marland Kitchen VITAMIN C 500 MG PO TABS Oral Take 500 mg by mouth daily.    Marland Kitchen ZALEPLON 5 MG PO CAPS Oral Take 5 mg by mouth at bedtime.      BP 107/45  Pulse 58  Temp(Src) 99.1 F (37.3 C) (Oral)  Resp 20  SpO2 96%  Physical Exam  Nursing note and vitals reviewed. Constitutional: He appears well-developed and well-nourished. No distress.  HENT:  Head: Normocephalic and  atraumatic.  Eyes: Pupils are equal, round, and reactive to light. No scleral icterus.  Cardiovascular: Normal rate and regular rhythm.   Pulmonary/Chest: Effort normal. No respiratory distress.  Abdominal: Soft. He exhibits distension. There is tenderness in the suprapubic area. There is no rebound, no guarding and no CVA tenderness.  Skin: Skin is warm and dry. No rash noted. He is not diaphoretic.    ED Course  Procedures (including critical care time)  Labs Reviewed  URINALYSIS, WITH MICROSCOPIC - Abnormal; Notable for the following:    Ketones, ur TRACE (*)    All other components within normal limits  CBC - Abnormal; Notable for the following:    WBC 11.4 (*)    RBC 3.59 (*)    Hemoglobin 10.2 (*)    HCT 31.9 (*)    Platelets 149 (*)    All other components within normal limits  DIFFERENTIAL - Abnormal; Notable for the following:    Neutro Abs 8.6 (*)    Monocytes Absolute 1.3 (*)    All other components within normal limits  COMPREHENSIVE METABOLIC PANEL - Abnormal; Notable for the following:    Sodium 133 (*)    Chloride 95 (*)    Glucose, Bld 163 (*)    BUN 46 (*)    Creatinine, Ser 2.44 (*)    Albumin 2.9 (*)    GFR calc non Af Amer 22 (*)    GFR calc Af Amer 26 (*)    All other components within normal limits  LIPASE, BLOOD  URINE CULTURE   Ct Abdomen Pelvis Wo Contrast  07/23/2011  *RADIOLOGY REPORT*  Clinical Data: Difficulty urinating, diabetes, kidney disease.  CT ABDOMEN AND PELVIS WITHOUT CONTRAST  Technique:  Multidetector CT imaging of the abdomen and pelvis was performed following the standard protocol without intravenous contrast.  Comparison: 06/13/2010  Findings: Trace pericardial effusion.  Coronary artery calcification.  Trace right and small left pleural effusions. Associated opacities; atelectasis versus pneumonia.  Mild interlobular septal prominence.  Intra-abdominal organ evaluation is limited without intravenous contrast.  Unchanged hypodensities  within the liver.  Layering high attenuation within the gallbladder.  No gallbladder wall thickening or pericholecystic fluid.  Calcified punctate foci scattered throughout the spleen.  Unremarkable pancreas, adrenal glands.  Bilateral lobular renal contours with lesions of varying complexity.  Further evaluation not possible without intravenous contrast.  No hydronephrosis or hydroureter. Questionable nonobstructing stones versus vascular calcifications.  No ureteral or bladder calculi identified.  Mild bilateral perinephric fat stranding is similar to prior.  No bowel obstruction.  No CT evidence for colitis.  Colonic diverticulosis.  Normal appendix.  No free intraperitoneal air or  fluid.  Decompressed bladder.  Foley catheter in place.  Absent versus atrophic prostate gland. Vast deferens calcifications.  No lymphadenopathy.  Advanced atherosclerotic calcification of the aorta and branch vessels.  A left renal artery stent in place. Aneurysmal dilatation of the right greater than left common femoral arteries, just proximal to the bifurcation.  This is similar to the prior. Bilateral inguinal surgical clips.  Further vascular evaluation is not possible without intravenous contrast.  Soft tissue attenuation/stranding in the right lateral subcutaneous tissues of the thigh, overlying the right femoral neck.  Diffuse osteopenia.  Multilevel degenerative changes.  No acute osseous abnormality.  IMPRESSION: Trace pericardial effusion.  Trace right and small left pleural effusions.  Bibasilar opacities; atelectasis versus pneumonia.  Question interlobular septal prominence as can be seen with edema.  Gallbladder stones and/or sludge.  No CT evidence for cholecystitis.  Bilateral atrophic kidneys with perinephric fat stranding.  No hydronephrosis hydroureter.  Decompressed bladder with Foley catheter balloon in place.  Left renal artery stent in place.  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  Further vascular evaluation is not possible without intravenous contrast.  Original Report Authenticated By: Waneta Martins, M.D.     1. Renal insufficiency   2. Dehydration       MDM   We'll check a urinalysis and send a urine for culture. My plan is to place a Foley catheter in order to relieve what I suspect is prostatic outlet obstruction around his urethra. I suspect that his abdominal discomfort will be relieved with placement of the Foley catheter. The patient likely will be able to be discharged to home with a Foley catheter in place and can followup with his urologist on Monday.      6:47 PM With Foley catheter placement, only a scant amount of urine was obtained. Patient reports no improvement of his lower abdominal discomfort. Foley catheter was flushed with no resultant improvement and urine outflow. Given this has not improved his clinical condition, my plan is to give IV fluids, obtain routine blood tests and CT scan   10:14 PM BUN and CR are elevated, usual Cr is around 1.5, today it is 2.44 with BUN 46 suggesting dehydration.  Will get CT with only oral contrast.     11:24 PM CT without contrast shows no reason for pt's lower abd pain.  No cough or fever, so I doubt pneumonia.  Urine culture sent.  I think decreased output is due to renal failure and dehydration.     11:40 PM' Patient reports that he is feeling improved. He again insists that he is not coughing more than usual and has not had fevers. I do not think he needs treatment for the possible bibasilar opacities seen on CT scan. Patient's spouse reports the patient has been seeing Dr. Eliott Nine with nephrology therefore I feel that the patient does have some degree of renal insufficiency. His last creatinine however was 1.14.  I see that he has been as high as 1.9.  Patient reports that he would rather go home rather than be admitted for renal insufficiency. I feel fine that he can remove the Foley  catheter since a postobstructive causes not the reason for his decreased urine output. Instead I favor dehydration. I have encouraged him to drink more oral fluids as possible. Patient and family know to return for any worsening symptoms or concerns.   Gavin Pound. Oletta Lamas, MD 07/25/11 [phone]

## 2011-07-23 NOTE — ED Notes (Signed)
Pt reports difficulty urinating for past week. Pt also complaining of abdominal pain, as well.

## 2011-07-23 NOTE — ED Notes (Signed)
[id number]> Expected date:<BR> Expected time:<BR> Means of arrival:<BR> Comments:<BR> [facility]

## 2011-07-23 NOTE — ED Notes (Signed)
IV started in one stick pt tolerated well

## 2011-07-23 NOTE — ED Notes (Signed)
Pt repeatedly complained about having to lie on his back on the stretcher. RN offered to turn patient several times, but pt did not want to be turned.

## 2011-07-23 NOTE — ED Notes (Signed)
2% Lidocaine jelly used to insert foley cath only 10 ml of urine out and it was clear and yellow.  Let MD know and he told me to flush it and see if we can get anything back but I was unable to get anything but the flush back.  MD did bladder scan an  It showed no t a lot of urine noted he stated.  Pt does not know what is going on as hehas only had dribbles today.  I explained we were going to run some tests and see if we could figure it out.

## 2011-07-23 NOTE — Discharge Instructions (Signed)
 Your creatinine value today was 2.44.  Please let your medical physician and nephrologist know of these values.  Drink plenty of fluids at home.  Return for worsening pain, vomiting, high fevers.        Dehydration, Elderly Dehydration is the loss of water and important blood salts like potassium (K+) and sodium (Na+) from the body. Vital organs like the kidneys, brain, and heart cannot function without a proper amount of water and salt. Any loss of fluids from the body can cause dehydration. Some warning signs of dehydration are listed below. SYMPTOMS Mild Dehydration:  Thirst. You may not be able to tell when you are thirsty.   Dry lips.   Slightly dry mouth membranes.  Moderate Dehydration:  Very dry mouth membranes.   Sunken eyes.   Skin does not bounce back quickly when lightly pinched and released.   Decreased urine production.  Severe Dehydration:  Rapid, weak pulse (more than 100 at rest).   Cold hands and feet.   Loss of ability to sweat in spite of heat and temperature.   Rapid breathing.   Blue lips.   Confusion, sleepy and difficult to awaken (lethargy) or difficult to arouse.   Significantly decreased urine production or no urine output for 8 hours.  CAUSES To treat dehydration, you must first find and treat the cause. Lack of fluid intake is a common cause of dehydration. This is because as we grow older, we are often not as thirsty as we should be. Correction of fluid lack by mouth is safe and restores the fluids and salts in your blood quickly. Oral rehydration solutions (ORS) are made which allow your intestines to absorb maximum amounts of water along with small amounts of salts. ORS are not the same as sports drinks designed for concentrated energy and salt replacement in healthy, high-performance athletes. Sports drinks can make dehydration worse. ORS are available from your local grocery store. Your pharmacist can guide you with these. Drinking plain  water may be harmful. Water does not contain salts and minerals (electrolytes). TREATMENT  Contact your caregiver for both mild dehydration and moderate dehydration. If you are severely dehydrated, go to a hospital for treatment. Intravenous fluids (IV's) will quickly reverse dehydration and are often life-saving in older persons. HOME CARE INSTRUCTIONS  In general, eat normally while drinking more fluids than usual. Drink small amounts of fluids frequently and increase as tolerated. Drink enough fluids to keep urine clear or pale yellow. Broths, weak decaffeinated tea, lemon lime soft drinks (allowed to go flat) and ORS replace fluids and electrolytes.   Avoid:   Carbonated drinks.   Juice.   Extremely hot or cold fluids.   Caffeine drinks.   Fatty, greasy foods.   Alcohol.   Tobacco.   Too much intake of anything at one time.   Gelatin desserts.   Probiotics are active cultures of beneficial bacteria. They may lessen the amount and number of diarrheal stools. Probiotics can be found in yogurt with active cultures and in supplements.   Wash hands well to avoid spreading germs (bacteria) and virus.   Checking your weight 2 times per day will help decide if fluid replacement is adequate. Your caregiver will tell you what amount of weight loss should concern you or suggest another visit to your personal physician.   Record your weight today. Compare this to your home scale and record all weights, time and date weighed. Try to check weight at the same time every day. Bring  this chart to your caregiver if you need to be seen again.   If running a fever, take and record temperatures.   Only take over-the-counter or prescription medicines for pain, discomfort or fever as directed by your caregiver.   Ask your caregiver if you should continue all prescribed and over-the-counter medicines.   If your caregiver has given you a follow-up appointment, it is very important to keep that  appointment. Not keeping the appointment could result in a chronic or permanent injury, pain, and disability. If there is any problem keeping the appointment, you must call back to this facility for assistance.  SEEK IMMEDIATE MEDICAL CARE IF:   You are unable to keep fluids down or other problems develop or you become worse in spite of treatment.   Vomiting or diarrhea develops or is present and becomes continual.   There is vomiting of blood or bile (green material).   There is blood in the stool or the stools are black and tarry.   There is no urine output in 6-8 hours or there is only a small amount of very dark urine.   Abdominal pain develops, increases or localizes.   You have an oral temperature above 102 F (38.9 C), not controlled by medicine.   You develop a rash, stiff neck, severe headache or become irritable or lethargic.   You develop excessive weakness, dizziness, fainting or extreme thirst.  MAKE SURE YOU:   Understand these instructions.   Will watch your condition.   Will get help right away if you are not doing well or get worse.  Document Released: 08/28/2003 Document Revised: 12/21/2010 Document Reviewed: 05/06/2009 [facility] Patient Information 2012 [place], [place].    Kidney Failure In kidney failure, the kidneys lose their ability to filter enough waste products from the blood. They also lose the ability to regulate the body's balance of salt and water. Eventually, the kidneys slow their production of urine or stop producing it completely. Waste products and water gather in the body. This can lead to a life-threatening overload of fluids (such as heart failure). It can also lead to a dangerous buildup of waste products in the blood. These extreme changes in blood chemistry can affect the function of the heart and brain.  TYPES OF KIDNEY FAILURE Acute kidney failure. In this form of kidney failure, the kidneys stop working properly because of a sudden  illness, a medicine, or a medical condition that causes one of the following:   A severe drop in blood pressure or an interruption in the normal blood flow to the kidneys. This can occur during:   Major surgery.   Severe burns with fluid loss.   Massive bleeding.   A heart attack that severely affects heart function.   Blood clots that travel to the kidney.   Direct damage to kidney cells or to the kidneys' filtering units. This can be caused by:   An inflammation of the kidneys.   Toxic chemicals.   Medicines or infections.   Blocked urine flow from the kidney. This can occur because of obstructions outside the kidney, such as:   Kidney stones.   Bladder tumors.   An enlarged prostate.  Blockage of urine flow within the kidney can also cause sudden kidney failure, as can occur with large muscle injuries.  Chronic kidney failure. In this form of kidney failure, the kidney gradually loses function. This happens over a period of years. It is a slow and gradual loss of the ability  of the kidneys to send out wastes, concentrate urine, and conserve the salts in your blood. Some of the causes of chronic kidney failure are:  Diabetes (very common cause).   Polycystic kidney disease.   Glomerulonephritis.   Alport syndrome.   The flow of urine out of the kidney is blocked (obstructive uropathy).   High blood pressure (very common).   Long-term exposure to lead, mercury, and other chemicals and medicines.   Kidney stones with infection.   Reflux nephropathy.   Pain medicine overuse.  Some forms of chronic kidney failure run in families. Your caregiver will ask you about family medical problems.  End-stage kidney disease (ESKD). This is also called end-stage kidney failure. In ESKD, kidney function worsens until the person dies. This is usually the result of longstanding chronic kidney failure, but sometimes it follows acute kidney failure. SYMPTOMS  Symptoms vary  depending on the type of kidney failure.   Acute kidney failure. Symptoms include:   Swelling (edema) resulting from salt and water overload.   High blood pressure.   Vomiting.   Tiredness (lethargy) caused by the toxic effects of waste products on brain function.   Feeling sick to your stomach (nauseous).   Decreased urine output.   Chronic kidney failure and ERSD. Because the kidney damage in chronic kidney failure occurs slowly over a long time, symptoms develop slowly. Symptoms can include:   Headache.   Weakness.   Itching.   Vomiting.   Pale skin.   Slowing of growth in children.   Fatigue.   Tiredness (lethargy).   Poor appetite.   Increased thirst.   High blood pressure.   Bone damage in adults.  DIAGNOSIS  If you have an illness or medical condition that increases the risk of acute kidney failure, your caregivers will watch you closely. You may have blood and urine tests that measure the function of your kidneys. If you have a medical condition that increases the risk of long-term kidney damage, your caregiver will check your blood pressure and look for symptoms of chronic kidney failure during rechecks. TREATMENT  Treatment depends on the type of kidney failure.   Acute kidney failure. Treatment begins with measures to correct the cause of kidney failure (shock, hemorrhage, burns, heart attack). After this has begun, more specific kidney treatment may include:   Fluids given through the vein (intravenously) to correct any abnormal fluid loss.   Medicines called diuretics that increase urine output.   Limited fluids by mouth.   A diet low in protein and high in carbohydrates.   Medicines to adjust high or low levels of blood chemicals, such as potassium and medicines to control high blood pressure.   Short-term dialysis may be necessary if the patient develops severe high blood pressure, severe fluid overload, heart failure, symptoms of altered brain  function, or severe abnormalities in blood chemistry.   Chronic kidney failure. People with chronic kidney failure are watched closely. They receive frequent physical exams, blood pressure checks, and blood testing. Treatment includes:   A low-protein and low-salt diet.   Medicines to adjust blood chemical levels.   Medicines to treat high blood pressure.   Sometimes, a hormonal medicine called erythropoietin is given to correct a low level of red blood cells (anemia).   ESKD. Treatment includes:   Dialysis until a donor can be found for a kidney transplant. Dialysis mechanically removes waste products from the blood.   Both kidneys may need to be removed surgically before a  transplant in patients with severe high blood pressure or chronic pyelonephritis.  PROGNOSIS   Acute kidney failure may go away on its own. Some people recover within a matter of days. Exactly how long the illness lasts varies greatly from person-to-person. The duration depends on the cause of the kidney problem. In rare cases, acute kidney failure progresses to ESKD. Among people who recover, about 50% have some permanent kidney damage. In most cases, this is not severe enough to prevent you from living a normal life.   Chronic kidney failure is a lifelong problem that can worsen over time to become ESKD. Not everyone develops ESKD. For those who do, the time it takes for ESKD to develop varies from person-to-person.   ESKD is a permanent condition that can be treated only with dialysis or a kidney transplant.  PREVENTION  Many forms of kidney failure cannot be prevented. People who have diabetes, high blood pressure, or coronary artery disease should try to control the illness with:  Appropriate diet.   Medicine.   Lifestyle changes.  If you have chronic kidney failure, you should tell all caregivers who treat you.  HOME CARE INSTRUCTIONS   Follow your diet and take your medicines as instructed.   Do not  use any new medicines (prescription, over-the-counter, or nutritional supplements) unless approved by your caregiver. Many medicines can worsen your kidney damage or need to have the dose adjusted.   If dialysis is scheduled, keep all appointments. Call if you are unable to keep an appointment.  SEEK MEDICAL CARE IF:   You develop unexplained weakness, tiredness, or appetite loss.   You feel poorly with no clear explanation.  SEEK IMMEDIATE MEDICAL CARE IF:   The amount of urine you produce either distinctly increases or decreases.   You develop swelling of the face and/or ankles.   You develop shortness of breath.  FOR MORE INFORMATION  National Institute of Diabetes and Digestive and Kidney Diseases: [employer] National Kid[employer] Foundation: www.kidney.org Document Released: 06/07/2005 Document Revised: 02/17/2011 Document Reviewed: 10/08/2009 [facility] Patient Information 2012 [place], [place].

## 2011-07-25 LAB — URINE CULTURE
Colony Count: NO GROWTH
Culture  Setup Time: 201302020149
Culture: NO GROWTH

## 2011-08-05 ENCOUNTER — Encounter (HOSPITAL_COMMUNITY): Payer: Self-pay | Admitting: Pharmacy Technician

## 2011-08-09 ENCOUNTER — Ambulatory Visit (HOSPITAL_COMMUNITY)
Admission: RE | Admit: 2011-08-09 | Discharge: 2011-08-09 | Disposition: A | Discharge status: Home / Self Care | Payer: Medicare Other | Source: Ambulatory Visit | Attending: Orthopedic Surgery | Admitting: Orthopedic Surgery

## 2011-08-09 ENCOUNTER — Encounter (HOSPITAL_COMMUNITY): Payer: Self-pay

## 2011-08-09 ENCOUNTER — Encounter (HOSPITAL_COMMUNITY)
Admission: RE | Admit: 2011-08-09 | Discharge: 2011-08-09 | Disposition: A | Discharge status: Home / Self Care | Payer: Medicare Other | Source: Ambulatory Visit | Attending: Orthopedic Surgery | Admitting: Orthopedic Surgery

## 2011-08-09 DIAGNOSIS — I252 Old myocardial infarction: Secondary | ICD-10-CM | POA: Insufficient documentation

## 2011-08-09 DIAGNOSIS — D168 Benign neoplasm of pelvic bones, sacrum and coccyx: Secondary | ICD-10-CM | POA: Insufficient documentation

## 2011-08-09 DIAGNOSIS — Z01818 Encounter for other preprocedural examination: Secondary | ICD-10-CM | POA: Insufficient documentation

## 2011-08-09 DIAGNOSIS — I771 Stricture of artery: Secondary | ICD-10-CM | POA: Insufficient documentation

## 2011-08-09 DIAGNOSIS — I517 Cardiomegaly: Secondary | ICD-10-CM | POA: Insufficient documentation

## 2011-08-09 DIAGNOSIS — Z01812 Encounter for preprocedural laboratory examination: Secondary | ICD-10-CM | POA: Insufficient documentation

## 2011-08-09 HISTORY — DX: Reserved for inherently not codable concepts without codable children: IMO0001

## 2011-08-09 HISTORY — DX: Encounter for other specified aftercare: Z51.89

## 2011-08-09 HISTORY — DX: Anemia, unspecified: D64.9

## 2011-08-09 HISTORY — DX: Essential (primary) hypertension: I10

## 2011-08-09 HISTORY — DX: Unspecified osteoarthritis, unspecified site: M19.90

## 2011-08-09 HISTORY — DX: Gastro-esophageal reflux disease without esophagitis: K21.9

## 2011-08-09 HISTORY — DX: Pneumonia, unspecified organism: J18.9

## 2011-08-09 HISTORY — DX: Peripheral vascular disease, unspecified: I73.9

## 2011-08-09 LAB — URINALYSIS, ROUTINE W REFLEX MICROSCOPIC
Bilirubin Urine: NEGATIVE
Glucose, UA: NEGATIVE mg/dL
Hgb urine dipstick: NEGATIVE
Ketones, ur: NEGATIVE mg/dL
Leukocytes, UA: NEGATIVE
Nitrite: NEGATIVE
Protein, ur: NEGATIVE mg/dL
Specific Gravity, Urine: 1.009 (ref 1.005–1.030)
Urobilinogen, UA: 0.2 mg/dL (ref 0.0–1.0)
pH: 6 (ref 5.0–8.0)

## 2011-08-09 LAB — COMPREHENSIVE METABOLIC PANEL
Albumin: 2.7 g/dL — ABNORMAL LOW (ref 3.5–5.2)
Alkaline Phosphatase: 69 U/L (ref 39–117)
BUN: 33 mg/dL — ABNORMAL HIGH (ref 6–23)
Calcium: 8.7 mg/dL (ref 8.4–10.5)
Potassium: 4.3 mEq/L (ref 3.5–5.1)
Sodium: 128 mEq/L — ABNORMAL LOW (ref 135–145)
Total Protein: 6.8 g/dL (ref 6.0–8.3)

## 2011-08-09 LAB — CBC
HCT: 31.1 % — ABNORMAL LOW (ref 39.0–52.0)
MCH: 28.3 pg (ref 26.0–34.0)
MCHC: 32.8 g/dL (ref 30.0–36.0)
RDW: 15 % (ref 11.5–15.5)

## 2011-08-09 LAB — DIFFERENTIAL
Basophils Absolute: 0 10*3/uL (ref 0.0–0.1)
Basophils Relative: 0 % (ref 0–1)
Eosinophils Absolute: 0.2 10*3/uL (ref 0.0–0.7)
Monocytes Relative: 13 % — ABNORMAL HIGH (ref 3–12)
Neutro Abs: 5.7 10*3/uL (ref 1.7–7.7)
Neutrophils Relative %: 73 % (ref 43–77)

## 2011-08-09 LAB — PROTIME-INR
INR: 1.13 (ref 0.00–1.49)
Prothrombin Time: 14.7 seconds (ref 11.6–15.2)

## 2011-08-09 LAB — SURGICAL PCR SCREEN
MRSA, PCR: POSITIVE — AB
Staphylococcus aureus: POSITIVE — AB

## 2011-08-09 LAB — APTT: aPTT: 43 seconds — ABNORMAL HIGH (ref 24–37)

## 2011-08-09 NOTE — Pre-Procedure Instructions (Signed)
08/09/11 Addendum to PAT note regarding abnormal labs.  Mackey Birchwood made aware of abnormal Sodium of 128 and Elevated AST and ALT.  Along with all other abnormal labs.

## 2011-08-09 NOTE — Pre-Procedure Instructions (Signed)
08/09/11 Addendum to previous note that PTT elevated at 43 and elevated BUN and CREA.

## 2011-08-09 NOTE — Pre-Procedure Instructions (Signed)
07/14/11 EKG on chart  LOV with Dr Donnie Aho (cardiology ) on chart  Cardiolite 2009 on chart

## 2011-08-09 NOTE — Pre-Procedure Instructions (Signed)
08/09/11 12noon Nurse notified [name] - nurse of Dr Darrelyn Hillock- abnormal lab results of CBC, CMET and PTT.

## 2011-08-09 NOTE — Patient Instructions (Signed)
20 [name]  08/09/2011   Your procedure is scheduled on:  08/13/11 0730am-0900am  Report to [facility] at 0530 AM.  Call this number if you have problems the morning of surgery: [phone]   Remember:   Do not eat food:After Midnight.  May have clear liquids:until Midnight .    Take these medicines the morning of surgery with A SIP OF WATER   Do not wear jewelry,   Do not wear lotions, powders, or perfumes.     Do not bring valuables to the hospital.  Contacts, dentures or bridgework may not be worn into surgery.  Leave suitcase in the car. After surgery it may be brought to your room.  For patients admitted to the hospital, checkout time is 11:00 AM the day of discharge.     Special Instructions: CHG Shower Use Special Wash: 1/2 bottle night before surgery and 1/2 bottle morning of surgery. shower chin to toes with CHG.  Wash face and private parts with regular soap.     Please read over the following fact sheets that you were given: MRSA Information, Blood Transfusion Fact Sheet, Incentive Spirometry Sheet, coughing and deep breathing exercises, leg exercises

## 2011-08-10 NOTE — H&P (Signed)
[name] DOB: 1920/10/11  Chief Complaint: Right hip pain  HPI The patient is a 78 year old male who is scheduled for a right hip tumor excision with Dr. Darrelyn Hillock on Friday 08/13/2011 at [facility]. The patient is being followed for their right hip pain. They are 2 months out from when symptoms began. Symptoms reported today include pain and pain at night.The patient has not gotten any relief of their symptoms with Cortisone injections. He is still having rather significant pain and swelling. He feels that lump is getting larger over the lateral aspect of the right hip.  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 04/24/1985 *RIGHT. 05/29/1990 Enthesopathy, hip (726.5). 05/29/1990 Degeneration, cervical disc (722.4). 12/30/1990 Displacement, lumbar disc w/o myelopathy (722.10). 10/13/1993 Enthesopathy, ankle NOS (726.70). 10/26/1993 Syndrome, carpal tunnel (354.0). 08/27/1996 Osteoarthrosis NOS, lower leg (715.96). 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 01/08/2000 Pain in limb (729.5). 02/01/2000 Scoliosis, idiopathic (737.30). 02/01/2000 Degeneration, lumbar/lumbosacral disc (722.52). 02/01/2000 Lumbago (724.2). 02/16/2000 Enthesopathy, hip (726.5). 09/27/2000 Osteoarthrosis NOS, other spec site (715.98). 11/28/2001 Pain in limb (729.5). 07/23/2002 Enthesopathy, ankle NOS (726.70). 03/06/2003 Degeneration, lumbar/lumbosacral disc (722.52). 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 10/26/2004 Lumbago (724.2). 01/15/2009 Prostate Disease Hypertension Gastroesophageal Reflux Disease Peripheral Neuropathy Hypercholesterolemia Diabetes Mellitus, Type II  Allergies AMOXICILLIN. 03/08/2003 APRESOLINE. 10/06/2007  Social History No alcohol use Tobacco use. Never smoker.  Medication History Vitamin B-12 ( Tablet, 1 Oral) Active. Vitamin C (500MG  Tablet, 1  Oral) Active. Iron Supplement (325 (65 Fe)MG Tablet, Oral) Active.  Physical Exam On physical exam of his hip, he has good motion of his hip without any pain, but he has a golf ball sized mass that is painful to palpation over the greater trochanteric bursa.Physical examination shows a slightly movable mass that appears to be in the soft tissue over the greater trochanteric region on the right. It is painful to touch and painful when he lays on that. No erythema. No signs of infection. No signs of gout. The remaining part of his thigh and knee are negative. Calf is soft and nontender, no phlebitis. Circulation is intact. Gait, he ambulates without support.  Asessment and Plans Bednar tumor right hip Right hip tumor excision We did MRI that in the past and the radiologist thought it could be fat necrosis. Dr. Darrelyn Hillock did not feel that this is fat necrosis. This is a very hard non movable mass. The patient and Dr. Darrelyn Hillock both talked about it and thought should be removed. We can remove the mass and just keep him in over night. Most likely  would have to take a portion of the iliotibial band with him. Put him on a monitor bed just to be safe. We will need clearance from his cardiologist.  Dimitri Ped, PA-C

## 2011-08-11 NOTE — Pre-Procedure Instructions (Signed)
08/11/11 Nurse called office back and wanted to make sure no labs needed to be repeated day of surgery.  Per Marchelle Folks per nurse, Mackey Birchwood Dr Darrelyn Hillock aware of labs.  No new orders at this time per Marchelle Folks per Mackey Birchwood, RN

## 2011-08-13 ENCOUNTER — Encounter (HOSPITAL_COMMUNITY): Payer: Self-pay | Admitting: Anesthesiology

## 2011-08-13 ENCOUNTER — Encounter (HOSPITAL_COMMUNITY)
Admission: RE | Disposition: A | Discharge status: Home / Self Care | Payer: Self-pay | Source: Ambulatory Visit | Attending: Orthopedic Surgery

## 2011-08-13 ENCOUNTER — Ambulatory Visit (HOSPITAL_COMMUNITY): Payer: Medicare Other | Admitting: Anesthesiology

## 2011-08-13 ENCOUNTER — Ambulatory Visit (HOSPITAL_COMMUNITY)
Admission: RE | Admit: 2011-08-13 | Discharge: 2011-08-15 | Disposition: A | Discharge status: Home / Self Care | Payer: Medicare Other | Source: Ambulatory Visit | Attending: Orthopedic Surgery | Admitting: Orthopedic Surgery

## 2011-08-13 ENCOUNTER — Other Ambulatory Visit: Payer: Self-pay | Admitting: Orthopedic Surgery

## 2011-08-13 ENCOUNTER — Encounter (HOSPITAL_COMMUNITY): Payer: Self-pay | Admitting: *Deleted

## 2011-08-13 DIAGNOSIS — M7071 Other bursitis of hip, right hip: Secondary | ICD-10-CM

## 2011-08-13 DIAGNOSIS — K219 Gastro-esophageal reflux disease without esophagitis: Secondary | ICD-10-CM | POA: Insufficient documentation

## 2011-08-13 DIAGNOSIS — M2420 Disorder of ligament, unspecified site: Secondary | ICD-10-CM | POA: Insufficient documentation

## 2011-08-13 DIAGNOSIS — M629 Disorder of muscle, unspecified: Secondary | ICD-10-CM | POA: Insufficient documentation

## 2011-08-13 DIAGNOSIS — M76899 Other specified enthesopathies of unspecified lower limb, excluding foot: Secondary | ICD-10-CM | POA: Insufficient documentation

## 2011-08-13 DIAGNOSIS — Z01812 Encounter for preprocedural laboratory examination: Secondary | ICD-10-CM | POA: Insufficient documentation

## 2011-08-13 DIAGNOSIS — I252 Old myocardial infarction: Secondary | ICD-10-CM | POA: Insufficient documentation

## 2011-08-13 DIAGNOSIS — N289 Disorder of kidney and ureter, unspecified: Secondary | ICD-10-CM | POA: Insufficient documentation

## 2011-08-13 DIAGNOSIS — I10 Essential (primary) hypertension: Secondary | ICD-10-CM | POA: Insufficient documentation

## 2011-08-13 DIAGNOSIS — M25559 Pain in unspecified hip: Secondary | ICD-10-CM | POA: Insufficient documentation

## 2011-08-13 DIAGNOSIS — E7800 Pure hypercholesterolemia, unspecified: Secondary | ICD-10-CM | POA: Insufficient documentation

## 2011-08-13 DIAGNOSIS — C44791 Other specified malignant neoplasm of skin of unspecified lower limb, including hip: Secondary | ICD-10-CM | POA: Insufficient documentation

## 2011-08-13 DIAGNOSIS — E119 Type 2 diabetes mellitus without complications: Secondary | ICD-10-CM | POA: Insufficient documentation

## 2011-08-13 DIAGNOSIS — Z9889 Other specified postprocedural states: Secondary | ICD-10-CM

## 2011-08-13 HISTORY — PX: EXCISION/RELEASE BURSA HIP: SHX5014

## 2011-08-13 LAB — COMPREHENSIVE METABOLIC PANEL
ALT: 33 U/L (ref 0–53)
AST: 25 U/L (ref 0–37)
Albumin: 2.9 g/dL — ABNORMAL LOW (ref 3.5–5.2)
Alkaline Phosphatase: 55 U/L (ref 39–117)
BUN: 22 mg/dL (ref 6–23)
CO2: 29 mEq/L (ref 19–32)
Calcium: 8.8 mg/dL (ref 8.4–10.5)
Chloride: 97 mEq/L (ref 96–112)
Creatinine, Ser: 1.54 mg/dL — ABNORMAL HIGH (ref 0.50–1.35)
GFR calc Af Amer: 45 mL/min — ABNORMAL LOW (ref >90)
GFR calc non Af Amer: 39 mL/min — ABNORMAL LOW (ref >90)
Glucose, Bld: 130 mg/dL — ABNORMAL HIGH (ref 70–99)
Potassium: 3.9 mEq/L (ref 3.5–5.1)
Sodium: 135 mEq/L (ref 135–145)
Total Bilirubin: 0.3 mg/dL (ref 0.3–1.2)
Total Protein: 6.5 g/dL (ref 6.0–8.3)

## 2011-08-13 LAB — GLUCOSE, CAPILLARY
Glucose-Capillary: 122 mg/dL — ABNORMAL HIGH (ref 70–99)
Glucose-Capillary: 127 mg/dL — ABNORMAL HIGH (ref 70–99)
Glucose-Capillary: 124 mg/dL — ABNORMAL HIGH (ref 70–99)

## 2011-08-13 SURGERY — RELEASE, BURSA, TROCHANTERIC
Anesthesia: General | Site: Hip | Laterality: Right | Wound class: Clean

## 2011-08-13 MED ORDER — ROSUVASTATIN CALCIUM 20 MG PO TABS
20.0000 mg | ORAL_TABLET | Freq: Every day | ORAL | Status: DC
  Administered 2011-08-13 – 2011-08-14 (×2): 20 mg via ORAL
  Filled 2011-08-13 (×3): qty 1

## 2011-08-13 MED ORDER — LINAGLIPTIN 5 MG PO TABS
5.0000 mg | ORAL_TABLET | Freq: Every day | ORAL | Status: DC
  Administered 2011-08-13 – 2011-08-15 (×3): 5 mg via ORAL
  Filled 2011-08-13 (×3): qty 1

## 2011-08-13 MED ORDER — LACTATED RINGERS IV SOLN: INTRAVENOUS | Status: DC

## 2011-08-13 MED ORDER — LACTATED RINGERS IV SOLN
INTRAVENOUS | Status: DC
  Administered 2011-08-13 – 2011-08-14 (×3): via INTRAVENOUS

## 2011-08-13 MED ORDER — HYDROMORPHONE HCL 2 MG PO TABS: 2.0000 mg | ORAL_TABLET | ORAL | Status: DC | PRN

## 2011-08-13 MED ORDER — PROMETHAZINE HCL 25 MG/ML IJ SOLN: 6.2500 mg | INTRAMUSCULAR | Status: DC | PRN

## 2011-08-13 MED ORDER — METOCLOPRAMIDE HCL 5 MG/ML IJ SOLN
5.0000 mg | Freq: Three times a day (TID) | INTRAMUSCULAR | Status: DC | PRN
  Administered 2011-08-13 – 2011-08-14 (×2): 10 mg via INTRAVENOUS
  Filled 2011-08-13 (×2): qty 2

## 2011-08-13 MED ORDER — HYDROCODONE-ACETAMINOPHEN 5-325 MG PO TABS
1.0000 | ORAL_TABLET | ORAL | Status: DC | PRN
  Administered 2011-08-13: 2 via ORAL
  Administered 2011-08-14: 1 via ORAL
  Filled 2011-08-13: qty 2
  Filled 2011-08-13: qty 1
  Filled 2011-08-13: qty 2

## 2011-08-13 MED ORDER — HYDROMORPHONE HCL PF 1 MG/ML IJ SOLN
0.5000 mg | INTRAMUSCULAR | Status: AC | PRN
  Administered 2011-08-13 (×4): 0.5 mg via INTRAVENOUS

## 2011-08-13 MED ORDER — TAMSULOSIN HCL 0.4 MG PO CAPS
0.4000 mg | ORAL_CAPSULE | Freq: Every day | ORAL | Status: DC
  Administered 2011-08-13 – 2011-08-14 (×2): 0.4 mg via ORAL
  Filled 2011-08-13 (×3): qty 1

## 2011-08-13 MED ORDER — LACTATED RINGERS IV SOLN
INTRAVENOUS | Status: DC
Start: 2011-08-13 — End: 2011-08-13
  Administered 2011-08-13 (×2): via INTRAVENOUS

## 2011-08-13 MED ORDER — OXYCODONE-ACETAMINOPHEN 7.5-325 MG PO TABS: 1.0000 | ORAL_TABLET | ORAL | Status: DC | PRN

## 2011-08-13 MED ORDER — CLINDAMYCIN PHOSPHATE 600 MG/50ML IV SOLN
600.0000 mg | Freq: Four times a day (QID) | INTRAVENOUS | Status: AC
  Administered 2011-08-13 – 2011-08-14 (×3): 600 mg via INTRAVENOUS
  Filled 2011-08-13 (×3): qty 50

## 2011-08-13 MED ORDER — MEPERIDINE HCL 50 MG/ML IJ SOLN: 6.2500 mg | INTRAMUSCULAR | Status: DC | PRN

## 2011-08-13 MED ORDER — PANTOPRAZOLE SODIUM 40 MG PO TBEC
40.0000 mg | DELAYED_RELEASE_TABLET | Freq: Every day | ORAL | Status: DC
  Administered 2011-08-14: 40 mg via ORAL
  Filled 2011-08-13 (×2): qty 1

## 2011-08-13 MED ORDER — SODIUM CHLORIDE 0.9 % IR SOLN
Status: DC | PRN
  Administered 2011-08-13: 08:00:00

## 2011-08-13 MED ORDER — OXYCODONE-ACETAMINOPHEN 5-325 MG PO TABS: 1.0000 | ORAL_TABLET | ORAL | Status: DC | PRN

## 2011-08-13 MED ORDER — HYDROMORPHONE HCL PF 1 MG/ML IJ SOLN
0.5000 mg | INTRAMUSCULAR | Status: DC | PRN
  Administered 2011-08-13 – 2011-08-14 (×3): 0.5 mg via INTRAVENOUS
  Filled 2011-08-13 (×3): qty 1

## 2011-08-13 MED ORDER — ONDANSETRON HCL 4 MG/2ML IJ SOLN
4.0000 mg | Freq: Four times a day (QID) | INTRAMUSCULAR | Status: DC | PRN
  Administered 2011-08-13 – 2011-08-14 (×3): 4 mg via INTRAVENOUS
  Filled 2011-08-13 (×3): qty 2

## 2011-08-13 MED ORDER — METOCLOPRAMIDE HCL 10 MG PO TABS: 5.0000 mg | ORAL_TABLET | Freq: Three times a day (TID) | ORAL | Status: DC | PRN

## 2011-08-13 MED ORDER — METHOCARBAMOL 100 MG/ML IJ SOLN
500.0000 mg | Freq: Four times a day (QID) | INTRAVENOUS | Status: DC | PRN
  Administered 2011-08-13: 500 mg via INTRAVENOUS
  Filled 2011-08-13 (×2): qty 5

## 2011-08-13 MED ORDER — PROPOFOL 10 MG/ML IV EMUL
INTRAVENOUS | Status: DC | PRN
  Administered 2011-08-13: 100 mg via INTRAVENOUS

## 2011-08-13 MED ORDER — CLINDAMYCIN PHOSPHATE 600 MG/50ML IV SOLN
600.0000 mg | INTRAVENOUS | Status: AC
  Administered 2011-08-13: 600 mg via INTRAVENOUS

## 2011-08-13 MED ORDER — BISACODYL 10 MG RE SUPP
10.0000 mg | Freq: Every day | RECTAL | Status: DC | PRN
  Administered 2011-08-14: 10 mg via RECTAL
  Filled 2011-08-13: qty 1

## 2011-08-13 MED ORDER — EPHEDRINE SULFATE 50 MG/ML IJ SOLN
INTRAMUSCULAR | Status: DC | PRN
  Administered 2011-08-13: 5 mg via INTRAVENOUS
  Administered 2011-08-13: 10 mg via INTRAVENOUS
  Administered 2011-08-13: 5 mg via INTRAVENOUS

## 2011-08-13 MED ORDER — ONDANSETRON HCL 4 MG PO TABS: 4.0000 mg | ORAL_TABLET | Freq: Four times a day (QID) | ORAL | Status: DC | PRN

## 2011-08-13 MED ORDER — FLEET ENEMA 7-19 GM/118ML RE ENEM: 1.0000 | ENEMA | Freq: Once | RECTAL | Status: AC | PRN

## 2011-08-13 MED ORDER — MIRTAZAPINE 30 MG PO TABS
30.0000 mg | ORAL_TABLET | Freq: Every day | ORAL | Status: DC
  Administered 2011-08-13 – 2011-08-14 (×2): 30 mg via ORAL
  Filled 2011-08-13 (×3): qty 1

## 2011-08-13 MED ORDER — NEOSTIGMINE METHYLSULFATE 1 MG/ML IJ SOLN
INTRAMUSCULAR | Status: DC | PRN
  Administered 2011-08-13: 4 mg via INTRAVENOUS

## 2011-08-13 MED ORDER — GLYCOPYRROLATE 0.2 MG/ML IJ SOLN
INTRAMUSCULAR | Status: DC | PRN
  Administered 2011-08-13: .6 mg via INTRAVENOUS

## 2011-08-13 MED ORDER — OXYCODONE-ACETAMINOPHEN 5-325 MG PO TABS: 1.0000 | ORAL_TABLET | ORAL | Status: AC | PRN

## 2011-08-13 MED ORDER — ASPIRIN EC 81 MG PO TBEC
81.0000 mg | DELAYED_RELEASE_TABLET | Freq: Every day | ORAL | Status: DC
  Administered 2011-08-14 – 2011-08-15 (×2): 81 mg via ORAL
  Filled 2011-08-13 (×2): qty 1

## 2011-08-13 MED ORDER — METHOCARBAMOL 500 MG PO TABS: 500.0000 mg | ORAL_TABLET | Freq: Four times a day (QID) | ORAL | Status: DC | PRN

## 2011-08-13 MED ORDER — LOSARTAN POTASSIUM 50 MG PO TABS
100.0000 mg | ORAL_TABLET | Freq: Every day | ORAL | Status: DC
  Administered 2011-08-14 – 2011-08-15 (×2): 100 mg via ORAL
  Filled 2011-08-13 (×2): qty 2

## 2011-08-13 MED ORDER — POLYETHYLENE GLYCOL 3350 17 G PO PACK
17.0000 g | PACK | Freq: Every day | ORAL | Status: DC | PRN
  Filled 2011-08-13: qty 1

## 2011-08-13 MED ORDER — FENTANYL CITRATE 0.05 MG/ML IJ SOLN
INTRAMUSCULAR | Status: DC | PRN
  Administered 2011-08-13 (×5): 50 ug via INTRAVENOUS

## 2011-08-13 MED ORDER — FINASTERIDE 5 MG PO TABS
5.0000 mg | ORAL_TABLET | Freq: Every day | ORAL | Status: DC
  Administered 2011-08-14 – 2011-08-15 (×2): 5 mg via ORAL
  Filled 2011-08-13 (×2): qty 1

## 2011-08-13 MED ORDER — ROCURONIUM BROMIDE 100 MG/10ML IV SOLN
INTRAVENOUS | Status: DC | PRN
  Administered 2011-08-13: 50 mg via INTRAVENOUS

## 2011-08-13 MED ORDER — LIDOCAINE HCL (CARDIAC) 20 MG/ML IV SOLN
INTRAVENOUS | Status: DC | PRN
  Administered 2011-08-13: 100 mg via INTRAVENOUS

## 2011-08-13 SURGICAL SUPPLY — 32 items
BAG SPEC THK2 15X12 ZIP CLS (MISCELLANEOUS) ×1
BAG ZIPLOCK 12X15 (MISCELLANEOUS) ×2 IMPLANT
CLOTH BEACON ORANGE TIMEOUT ST (SAFETY) ×2 IMPLANT
DRAPE ORTHO SPLIT 77X108 STRL (DRAPES) ×2
DRAPE SURG ORHT 6 SPLT 77X108 (DRAPES) ×1 IMPLANT
DRAPE U-SHAPE 47X51 STRL (DRAPES) ×2 IMPLANT
DRSG EMULSION OIL 3X16 NADH (GAUZE/BANDAGES/DRESSINGS) ×2 IMPLANT
DRSG PAD ABDOMINAL 8X10 ST (GAUZE/BANDAGES/DRESSINGS) ×6 IMPLANT
DURAPREP 26ML APPLICATOR (WOUND CARE) ×2 IMPLANT
ELECT REM PT RETURN 9FT ADLT (ELECTROSURGICAL) ×2
ELECTRODE REM PT RTRN 9FT ADLT (ELECTROSURGICAL) ×1 IMPLANT
GAUZE SPONGE 4X4 12PLY STRL LF (GAUZE/BANDAGES/DRESSINGS) ×2 IMPLANT
GLOVE BIOGEL PI IND STRL 8 (GLOVE) ×1 IMPLANT
GLOVE BIOGEL PI IND STRL 8.5 (GLOVE) ×1 IMPLANT
GLOVE BIOGEL PI INDICATOR 8 (GLOVE) ×1
GLOVE BIOGEL PI INDICATOR 8.5 (GLOVE) ×1
GLOVE ECLIPSE 8.0 STRL XLNG CF (GLOVE) ×4 IMPLANT
GOWN PREVENTION PLUS LG XLONG (DISPOSABLE) ×4 IMPLANT
GOWN STRL REIN XL XLG (GOWN DISPOSABLE) ×4 IMPLANT
KIT BASIN OR (CUSTOM PROCEDURE TRAY) ×2 IMPLANT
MANIFOLD NEPTUNE II (INSTRUMENTS) ×2 IMPLANT
NS IRRIG 1000ML POUR BTL (IV SOLUTION) ×2 IMPLANT
PACK TOTAL JOINT (CUSTOM PROCEDURE TRAY) ×2 IMPLANT
POSITIONER SURGICAL ARM (MISCELLANEOUS) ×2 IMPLANT
SPONGE GAUZE 4X4 12PLY (GAUZE/BANDAGES/DRESSINGS) ×2 IMPLANT
STAPLER VISISTAT (STAPLE) ×2 IMPLANT
SUT VIC AB 0 CT1 36 (SUTURE) ×2 IMPLANT
SUT VIC AB 1 CT1 27 (SUTURE) ×3
SUT VIC AB 1 CT1 27XBRD ANTBC (SUTURE) ×3 IMPLANT
TAPE HYPAFIX 6X30 (GAUZE/BANDAGES/DRESSINGS) ×2 IMPLANT
TOWEL OR 17X26 10 PK STRL BLUE (TOWEL DISPOSABLE) ×4 IMPLANT
WATER STERILE IRR 1500ML POUR (IV SOLUTION) ×2 IMPLANT

## 2011-08-13 NOTE — Transfer of Care (Signed)
Immediate Anesthesia Transfer of Care Note  Patient: [name]  Procedure(s) Performed: Procedure(s) (LRB): EXCISION/RELEASE BURSA HIP (Right)  Patient Location: PACU  Anesthesia Type: General  Level of Consciousness: awake  Airway & Oxygen Therapy: Patient Spontanous Breathing and Patient connected to face mask oxygen  Post-op Assessment: Report given to PACU RN  Post vital signs: stable  Complications: No apparent anesthesia complications

## 2011-08-13 NOTE — Interval H&P Note (Signed)
History and Physical Interval Note:  08/13/2011 8:43 AM  [name]  has presented today for surgery, with the diagnosis of benign tumor of right hip,exotosis  The various methods of treatment have been discussed with the patient and family. After consideration of risks, benefits and other options for treatment, the patient has consented to  Procedure(s) (LRB): EXCISION/RELEASE BURSA HIP (Right) as a surgical intervention .  The patients' history has been reviewed, patient examined, no change in status, stable for surgery.  I have reviewed the patients' chart and labs.  Questions were answered to the patient's satisfaction.     [name] A

## 2011-08-13 NOTE — Brief Op Note (Signed)
08/13/2011  8:48 AM  PATIENT:  [name]  78 y.o. male  PRE-OPERATIVE DIAGNOSIS:  benign tumor of right hip,exotosis  POST-OPERATIVE DIAGNOSIS:  benign tumor of right hip,exotosis  PROCEDURE:  Procedure(s) (LRB): EXCISION/RELEASE BURSA HIP (Right)  SURGEON:  Surgeon(s) and Role:    * Jacki Cones, MD - Primary  PHYSICIAN ASSISTANT: Dimitri Ped PA    ANESTHESIA:   general  EBL:  Total I/O In: 1000 [I.V.:1000] Out: 50 [Blood:50]  BLOOD ADMINISTERED:none  DRAINS: none   LOCAL MEDICATIONS USED:  NONE  SPECIMEN:  Source of Specimen:  Right Greater Trochanter-Three seperate Specimens.  DISPOSITION OF SPECIMEN:  PATHOLOGY  COUNTS:  YES  TOURNIQUET:  * No tourniquets in log *  DICTATION: .Other Dictation: Dictation Number [id number]  PLAN OF CARE: Admit for overnight observation  PATIENT DISPOSITION:  PACU - hemodynamically stable.   Delay start of Pharmacological VTE agent (>24hrs) due to surgical blood loss or risk of bleeding: yes

## 2011-08-13 NOTE — Anesthesia Postprocedure Evaluation (Signed)
  Anesthesia Post-op Note  Patient: [name]  Procedure(s) Performed: Procedure(s) (LRB): EXCISION/RELEASE BURSA HIP (Right)  Patient Location: PACU  Anesthesia Type: General  Level of Consciousness: awake and alert   Airway and Oxygen Therapy: Patient Spontanous Breathing  Post-op Pain: mild  Post-op Assessment: Post-op Vital signs reviewed, Patient's Cardiovascular Status Stable, Respiratory Function Stable, Patent Airway and No signs of Nausea or vomiting  Post-op Vital Signs: stable  Complications: No apparent anesthesia complications

## 2011-08-13 NOTE — Anesthesia Preprocedure Evaluation (Addendum)
Anesthesia Evaluation  Patient identified by MRN, date of birth, ID band Patient awake    Reviewed: Allergy & Precautions, H&P , NPO status , Patient's Chart, lab work & pertinent test results  Airway Mallampati: II TM Distance: >3 FB Neck ROM: Full    Dental No notable dental hx.    Pulmonary neg pulmonary ROS,  clear to auscultation  Pulmonary exam normal       Cardiovascular hypertension, Pt. on medications + Past MI, + Peripheral Vascular Disease and neg cardio ROS Regular Normal    Neuro/Psych Negative Neurological ROS  Negative Psych ROS   GI/Hepatic negative GI ROS, Neg liver ROS, GERD-  Medicated and Controlled,  Endo/Other  Negative Endocrine ROSDiabetes mellitus-  Renal/GU negative Renal ROS  Genitourinary negative   Musculoskeletal negative musculoskeletal ROS (+)   Abdominal   Peds negative pediatric ROS (+)  Hematology negative hematology ROS (+)   Anesthesia Other Findings   Reproductive/Obstetrics negative OB ROS                          Anesthesia Physical Anesthesia Plan  ASA: III  Anesthesia Plan: General   Post-op Pain Management:    Induction: Intravenous  Airway Management Planned:   Additional Equipment:   Intra-op Plan:   Post-operative Plan: Extubation in OR  Informed Consent: I have reviewed the patients History and Physical, chart, labs and discussed the procedure including the risks, benefits and alternatives for the proposed anesthesia with the patient or authorized representative who has indicated his/her understanding and acceptance.   Dental advisory given  Plan Discussed with: CRNA  Anesthesia Plan Comments:         Anesthesia Quick Evaluation

## 2011-08-14 LAB — GLUCOSE, CAPILLARY
Glucose-Capillary: 150 mg/dL — ABNORMAL HIGH (ref 70–99)
Glucose-Capillary: 132 mg/dL — ABNORMAL HIGH (ref 70–99)
Glucose-Capillary: 134 mg/dL — ABNORMAL HIGH (ref 70–99)

## 2011-08-14 LAB — BASIC METABOLIC PANEL
CO2: 28 mEq/L (ref 19–32)
Chloride: 98 mEq/L (ref 96–112)
Creatinine, Ser: 1.24 mg/dL (ref 0.50–1.35)
GFR calc Af Amer: 58 mL/min — ABNORMAL LOW (ref >90)
Potassium: 3.9 mEq/L (ref 3.5–5.1)
Sodium: 136 mEq/L (ref 135–145)

## 2011-08-14 MED ORDER — ALUM & MAG HYDROXIDE-SIMETH 200-200-20 MG/5ML PO SUSP
30.0000 mL | ORAL | Status: DC | PRN
  Filled 2011-08-14: qty 30

## 2011-08-14 MED FILL — Bacitracin Zinc Oint 500 Unit/GM: CUTANEOUS | Qty: 15 | Status: AC

## 2011-08-14 MED FILL — Thrombin For Soln 5000 Unit: CUTANEOUS | Qty: 1 | Status: AC

## 2011-08-14 MED FILL — Thrombin For Soln 5000 Unit: CUTANEOUS | Qty: 5000 | Status: AC

## 2011-08-14 NOTE — Evaluation (Signed)
Physical Therapy Evaluation Patient Details Name: [name] MRN: [id number] DOB: October 13, 1920 Today's Date: 08/14/2011  Problem List:  Patient Active Problem List  Diagnoses  . Bursitis of right hip    Past Medical History:  Past Medical History  Diagnosis Date  . Kidney disease   . Diabetes mellitus   . Hypertension   . MI (myocardial infarction)     1974  . Peripheral vascular disease     veinsi rerouted in both legs   . Pneumonia     hx of   . Anemia   . Blood transfusion     hx of 6 months ago   . GERD (gastroesophageal reflux disease)   . Arthritis    Past Surgical History:  Past Surgical History  Procedure Date  . Back surgery     back surgery x 2   . Eye surgery     bilateral cataract surgery with implants   . Other surgical history     2 fatty tumors removed - on different arms   . Other surgical history     prostate surgery x 3   . Other surgical history     veins rerouted in both legs   . Other surgical history     hemorrhoid surgery  . Other surgical history     carpal tunnel right   . Other surgical history     big toe nails removed   . Other surgical history     repair of blood vessel in esophagus and stomach- 2006  . Other surgical history     bilateral kidney stents  2007  . Other surgical history     small bowel obstruction 2011     PT Assessment/Plan/Recommendation PT Assessment Clinical Impression Statement: Pt presents s/p R trochanteric bursa release POD 1 with decreased strength and moblity.  Pt only able to tolerate approx 5' of ambulation due to pt stating he felt dizzy and wanted to sit.  BP stable throughout.  Pt will benefit from skilled PT in acute venue to address deficits.  PT recommends short term SNF vs. HHPT depending on pt progress.  Plan to see pt again today to further assess.   PT Recommendation/Assessment: Patient will need skilled PT in the acute care venue PT Problem List: Decreased strength;Decreased range of  motion;Decreased activity tolerance;Decreased balance;Decreased mobility;Decreased safety awareness;Decreased knowledge of use of DME;Pain;Decreased cognition Barriers to Discharge: Decreased caregiver support Barriers to Discharge Comments: Wife states that he will occasionally be at home alone for her to run errands.  PT Therapy Diagnosis : Difficulty walking;Abnormality of gait;Generalized weakness;Acute pain PT Plan PT Frequency: Min 3X/week PT Treatment/Interventions: DME instruction;Gait training;Functional mobility training;Therapeutic activities;Therapeutic exercise;Patient/family education PT Recommendation Follow Up Recommendations: Home health PT;Skilled nursing facility;Supervision for mobility/OOB Equipment Recommended: Rolling walker with 5" wheels PT Goals  Acute Rehab PT Goals PT Goal Formulation: With patient Time For Goal Achievement: 7 days Pt will go Supine/Side to Sit: with supervision PT Goal: Supine/Side to Sit - Progress: Goal set today Pt will go Sit to Supine/Side: with supervision PT Goal: Sit to Supine/Side - Progress: Goal set today Pt will go Sit to Stand: with supervision PT Goal: Sit to Stand - Progress: Goal set today Pt will go Stand to Sit: with supervision PT Goal: Stand to Sit - Progress: Goal set today Pt will Ambulate: 51 - 150 feet;with supervision;with least restrictive assistive device PT Goal: Ambulate - Progress: Goal set today  PT Evaluation Precautions/Restrictions  Precautions Precautions: Fall  Required Braces or Orthoses: No Restrictions Weight Bearing Restrictions: No RLE Weight Bearing: Weight bearing as tolerated Prior Functioning  Home Living Lives With: Spouse Type of Home: House Home Layout: One level Home Access: Ramped entrance Home Adaptive Equipment: Walker - four wheeled;Straight cane;Shower chair with back Prior Function Level of Independence: Independent with gait;Requires assistive device for independence;Needs  assistance with ADLs Vocation: Retired Comments: Wife states that she would have to assist him getting out of bed "sometimes" but he was ambulating with cane without assist.  Cognition Cognition Arousal/Alertness: Awake/alert Overall Cognitive Status: Appears within functional limits for tasks assessed Orientation Level: Oriented X4 Cognition - Other Comments: A & O x 4, however had increased confusion (stated that therapist was his granddaughter and he thought his wife was his mother).  Sensation/Coordination Sensation Light Touch: Appears Intact Coordination Gross Motor Movements are Fluid and Coordinated: Yes Extremity Assessment RLE Assessment RLE Assessment: Exceptions to [facility] RLE Strength RLE Overall Strength Comments: Grossly WFL per functional assessment.  Unable to test hip due to increased pain.  LLE Strength LLE Overall Strength Comments: Grossly WFL per functional assessment.  Mobility (including Balance) Bed Mobility Bed Mobility: Yes Supine to Sit: 1: +2 Total assist;Patient percentage (comment);HOB elevated (Comment degrees) Supine to Sit Details (indicate cue type and reason): Pt assist 40%.  +2 assist for B LE and for trunk.  Provided continuous manual and verbal cues for hand placement to assist trunk.   Transfers Transfers: Yes Sit to Stand: 1: +2 Total assist;Patient percentage (comment);From elevated surface;With upper extremity assist;From bed Sit to Stand Details (indicate cue type and reason): Pt assist 60%.  Requires increased assist for forward weight shift into standing with cues for hand placement on bed.  Stand to Sit: 1: +2 Total assist;Patient percentage (comment);With upper extremity assist;With armrests;To chair/3-in-1 Stand to Sit Details: Pt assist 60%.  Requires assist for controlled descent with cues for hand placement.  Ambulation/Gait Ambulation/Gait: Yes Ambulation/Gait Assistance: 1: +2 Total assist;Patient percentage (comment) Ambulation/Gait  Assistance Details (indicate cue type and reason): Pt assist 70%.  +2 for safety and line management.  Cues provided for sequencing/technique with RW.  Pt only took a few steps due to pt stating he felt dizzy/nauseous.  BP was 143/67. Ambulation Distance (Feet): 5 Feet Assistive device: Rolling walker Gait Pattern: Step-to pattern;Decreased stance time - right;Decreased step length - left;Trunk flexed Gait velocity: decreased Stairs: No Wheelchair Mobility Wheelchair Mobility: No    Exercise    End of Session PT - End of Session Equipment Utilized During Treatment: Gait belt Activity Tolerance: Patient limited by pain;Other (comment) (Pt limited by dizziness) Patient left: in chair;with call bell in reach;with family/visitor present Nurse Communication: Mobility status for transfers;Mobility status for ambulation General Behavior During Session: [facility] for tasks performed Cognition: [facility] for tasks performed  Page, Meribeth Mattes 08/14/2011, 9:42 AM

## 2011-08-14 NOTE — Op Note (Signed)
NAMEDARRION, WYSZYNSKI NO.:  [id number]  MEDICAL RECORD NO.:  [id number]  LOCATION:  1609                         FACILITY:  [facility]  PHYSICIAN:  Georges Lynch. Shikira Folino, M.D.DATE OF BIRTH:  [birth year]  DATE OF PROCEDURE:  08/13/2011 DATE OF DISCHARGE:                              OPERATIVE REPORT   SURGEON:  Georges Lynch. Darrelyn Hillock, M.D.  ASSISTANT:  Dimitri Ped, [place].  PREOPERATIVE DIAGNOSES: 1. Exostosis of the greater trochanter. 2. Questionable soft tissue tumor, right greater trochanter.  POSTOPERATIVE DIAGNOSES: 1. Exostosis of the greater trochanter. 2. What appeared to be a necrotic greater trochanteric bursa with     necrosis of the iliotibial band. Note, specimens were sent to the lab for a final diagnosis.  OPERATION: 1. Excision of a portion of the iliotibial band. 2. Excision of a large necrotic greater trochanteric bursa. 3. Ostectomy of a large exostosis of the greater trochanter.  PROCEDURE: 1. Under general anesthesia, routine orthopedic prepping and draping     of the right hip was carried out with the patient on the left side     and right side up.  He had clindamycin 600 mg IV.  The appropriate     time-out was carried out prior to making an incision.  Also, I     marked the appropriate right leg in the holding area.  Incision was     made over the posterolateral aspect of the right hip.  Bleeders     were identified and cauterized.  Immediately upon going down     through the subcutaneous tissue, we experienced some fluid which     literally was coming from a large necrotic greater trochanteric     bursa that wrapped around the anterior and posterior around the     greater trochanter.  I removed that and sent that as a separate     specimen #1. 2. I sent a specimen of the iliotibial band and it was in that     particular area as well. 3. I sent a bone specimen from the exostosis that I removed from the     greater trochanter, so I sent 3  separate specimens.  I thoroughly     debrided out the trochanteric area and removing the bone it did not     appear to be any tumor in the bone per se with good bleeding bone.     The subcutaneous tissue did not appear to be involved.  I     thoroughly irrigated out the area and then bone waxed all bone ends     of the trochanter.  I then inserted some     thrombin-soaked Gelfoam and closed the wound layer in usual     fashion.  I reapproximated the portion of the iliotibial band.     Subcutaneous was closed with 0 Vicryl and the skin was closed in     usual fashion.  Sterile dressings were applied.          ______________________________ Georges Lynch Darrelyn Hillock, M.D.     RAG/MEDQ  D:  08/13/2011  T:  08/14/2011  Job:  [id number]

## 2011-08-14 NOTE — Progress Notes (Signed)
Physical Therapy Treatment Patient Details Name: [name] MRN: [id number] DOB: Sep 10, 1920 Today's Date: 08/14/2011  PT Assessment/Plan  PT - Assessment/Plan Comments on Treatment Session: Pt progressing well this afternoon.  Tolerated increased ambulation with little assist required.  Pt to D/C tomorrow.   PT Plan: Discharge plan remains appropriate PT Frequency: Min 3X/week Follow Up Recommendations: Home health PT Equipment Recommended: Rolling walker with 5" wheels PT Goals  Acute Rehab PT Goals PT Goal Formulation: With patient Time For Goal Achievement: 7 days Pt will go Supine/Side to Sit: with supervision PT Goal: Supine/Side to Sit - Progress: Progressing toward goal Pt will go Sit to Supine/Side: with supervision PT Goal: Sit to Supine/Side - Progress: Progressing toward goal Pt will go Sit to Stand: with supervision PT Goal: Sit to Stand - Progress: Progressing toward goal Pt will go Stand to Sit: with supervision PT Goal: Stand to Sit - Progress: Progressing toward goal Pt will Ambulate: 51 - 150 feet;with supervision;with least restrictive assistive device PT Goal: Ambulate - Progress: Progressing toward goal  PT Treatment Precautions/Restrictions  Precautions Precautions: Fall Required Braces or Orthoses: No Restrictions Weight Bearing Restrictions: No RLE Weight Bearing: Weight bearing as tolerated Mobility (including Balance) Bed Mobility Bed Mobility: Yes Supine to Sit: 1: +2 Total assist;Patient percentage (comment) Supine to Sit Details (indicate cue type and reason): Pt assist 50-60%.  Assist for R LE and trunk with cues for UE placement.  Transfers Transfers: Yes Sit to Stand: 1: +2 Total assist;Patient percentage (comment);From elevated surface;With upper extremity assist;From bed;From chair/3-in-1;With armrests Sit to Stand Details (indicate cue type and reason): Pt assist 70%.  Cues for forward weight shift and UE placement on bed.  (Performed x  2 from bed and recliner) Stand to Sit: 1: +2 Total assist;Patient percentage (comment);With upper extremity assist;With armrests;To chair/3-in-1;To bed Stand to Sit Details: Pt assist 70%.  Cues for hand placement for controlled descent.  (Performed x 2 to recliner and bed. ) Ambulation/Gait Ambulation/Gait: Yes Ambulation/Gait Assistance: 1: +2 Total assist;Patient percentage (comment) Ambulation/Gait Assistance Details (indicate cue type and reason): Pt assist 80%.  +2 for safety and line management.  Cues for sequencing/technique and upright posture.  Ambulation Distance (Feet): 80 Feet Assistive device: Rolling walker Gait Pattern: Step-to pattern;Decreased stance time - right;Decreased step length - left;Trunk flexed Gait velocity: decreased    Exercise    End of Session PT - End of Session Equipment Utilized During Treatment: Gait belt Activity Tolerance: Patient limited by pain Patient left: in bed;with call bell in reach Nurse Communication: Mobility status for transfers;Mobility status for ambulation General Behavior During Session: [facility] for tasks performed Cognition: [facility] for tasks performed  Page, Meribeth Mattes 08/14/2011, 3:32 PM

## 2011-08-14 NOTE — Progress Notes (Signed)
Subjective: Very nauseated and has vomited several times. Abdomen distended but has bowel sounds and has had 3-Bowel Movements and has been passing gas. Will Dc Narcotics and Foley and keep today for observation of Ileus. Dressing dry.He is on Flowmax.   Objective: Vital signs in last 24 hours: Temp:  [97 F (36.1 C)-99.6 F (37.6 C)] 98.9 F (37.2 C) (02/23 0520) Pulse Rate:  [57-80] 80  (02/23 0520) Resp:  [11-16] 16  (02/23 0520) BP: (126-177)/(45-76) 170/71 mmHg (02/23 0520) SpO2:  [97 %-100 %] 97 % (02/23 0520) Weight:  [78.472 kg (173 lb)] 78.472 kg (173 lb) (02/22 1041)  Intake/Output from previous day: 02/22 0701 - 02/23 0700 In: 3440 [P.O.:840; I.V.:2600] Out: 800 [Urine:750; Blood:50] Intake/Output this shift:    No results found for this basename: HGB:5 in the last 72 hours No results found for this basename: WBC:2,RBC:2,HCT:2,PLT:2 in the last 72 hours  Basename 08/14/11 0440 08/13/11 0600  NA 136 135  K 3.9 3.9  CL 98 97  CO2 28 29  BUN 18 22  CREATININE 1.24 1.54*  GLUCOSE 147* 130*  CALCIUM 8.8 8.8   No results found for this basename: LABPT:2,INR:2 in the last 72 hours  Neurologically intact Dorsiflexion/Plantar flexion intact  Assessment/Plan: DC for tomorrow if stable.   Almarie Kurdziel A 08/14/2011, 7:46 AM

## 2011-08-14 NOTE — Progress Notes (Signed)
Patient reporting need to void and abdominal/bladder discomfort.  Foley checked for placement and patency.  Foley flushed with normal saline.  Bladder scan performed, showed 0 ml.  Urine output is adequate.  Abdomen does appear distended but above umbilicus. Patient reports passing flatus and had 3 BM's earlier in the day.  PA on call [employer] paged. New orders received.  Patient encouraged to take the newly ordered Maalox with simethicone but he refused.  Will continue to monitor.

## 2011-08-14 NOTE — Progress Notes (Signed)
Pt attempted to void after walking with Physical Therapy. Able to void approximately 40 cc then Post Void Residual with Bladder Scan revealed 169 cc. Will hold  On I/O cath and continue IV fluids as ordered at 100 cc/hr  until voiding.Encouraged po fluids.

## 2011-08-15 LAB — BASIC METABOLIC PANEL
CO2: 28 mEq/L (ref 19–32)
Chloride: 98 mEq/L (ref 96–112)
Sodium: 134 mEq/L — ABNORMAL LOW (ref 135–145)

## 2011-08-15 LAB — GLUCOSE, CAPILLARY: Glucose-Capillary: 119 mg/dL — ABNORMAL HIGH (ref 70–99)

## 2011-08-15 MED ORDER — ASPIRIN 81 MG PO TBEC: 81.0000 mg | DELAYED_RELEASE_TABLET | Freq: Every day | ORAL | Status: DC

## 2011-08-15 MED ORDER — ACETAMINOPHEN 325 MG PO TABS: 650.0000 mg | ORAL_TABLET | Freq: Four times a day (QID) | ORAL | Status: DC | PRN

## 2011-08-15 MED ORDER — ACETAMINOPHEN 325 MG PO TABS
ORAL_TABLET | ORAL | Status: AC
  Administered 2011-08-15: 650 mg via ORAL
  Filled 2011-08-15: qty 2

## 2011-08-15 MED ORDER — ACETAMINOPHEN 325 MG PO TABS
650.0000 mg | ORAL_TABLET | Freq: Four times a day (QID) | ORAL | Status: DC | PRN
  Administered 2011-08-15: 650 mg via ORAL

## 2011-08-15 NOTE — Progress Notes (Signed)
Pt discharged to home . To family auto via w/c. Assessment unchanged from am.

## 2011-08-15 NOTE — Progress Notes (Addendum)
Cm spoke with pt with wife at bedside concerning d/c planning. Per pt choice [facility] to provide [facility] services. Pt has access to DME. Spouse and adult children to assist in home care.   [name] [phone]

## 2011-08-15 NOTE — Progress Notes (Signed)
Physical Therapy Treatment Patient Details Name: [name] MRN: [id number] DOB: 11-03-1920 Today's Date: 08/15/2011  PT Assessment/Plan  PT - Assessment/Plan Comments on Treatment Session: Pt continues to progress well with ambulation.  Continues to require assist and cuing for transfers.  Pt and wife verbalize understanding of education on safety.  Discussed ordering RW for pt, however pt and wife state they can use standard walker at home and allow HHPT to get front wheels if needed. Pt ready for D/C.  PT Plan: Discharge plan remains appropriate PT Frequency: Min 3X/week Follow Up Recommendations: Home health PT Equipment Recommended: None recommended by PT (Pt states will use standard walker) PT Goals  Acute Rehab PT Goals PT Goal Formulation: With patient Time For Goal Achievement: 7 days Pt will go Sit to Stand: with supervision PT Goal: Sit to Stand - Progress: Progressing toward goal Pt will go Stand to Sit: with supervision PT Goal: Stand to Sit - Progress: Progressing toward goal Pt will Ambulate: 51 - 150 feet;with supervision;with least restrictive assistive device PT Goal: Ambulate - Progress: Progressing toward goal  PT Treatment Precautions/Restrictions  Precautions Precautions: Fall Required Braces or Orthoses: No Restrictions Weight Bearing Restrictions: No RLE Weight Bearing: Weight bearing as tolerated Mobility (including Balance) Transfers Transfers: Yes Sit to Stand: 1: +2 Total assist;Patient percentage (comment);From elevated surface;With upper extremity assist;From bed Sit to Stand Details (indicate cue type and reason): Pt assist 70%.  Wife assisting therapist with cues for pt to scoot to EOB before standing and for proper UE placement on bed.  Stand to Sit: 4: Min assist;With upper extremity assist;With armrests;To chair/3-in-1 Stand to Sit Details: Min/guard for controlled descent with cues for hand placement on arm rests.    Ambulation/Gait Ambulation/Gait: Yes Ambulation/Gait Assistance: 4: Min assist Ambulation/Gait Assistance Details (indicate cue type and reason): Min/guard for safety with cues for upright posture.  Ambulation Distance (Feet): 80 Feet Assistive device: Rolling walker Gait Pattern: Step-through pattern;Trunk flexed Gait velocity: decreased    Exercise    End of Session PT - End of Session Activity Tolerance: Patient limited by pain Patient left: in chair;with call bell in reach;with family/visitor present General Behavior During Session: [facility] for tasks performed Cognition: [facility] for tasks performed  Page, Meribeth Mattes 08/15/2011, 10:30 AM

## 2011-08-15 NOTE — Progress Notes (Signed)
Subjective: 2 Days Post-Op Procedure(s) (LRB): EXCISION/RELEASE BURSA HIP (Right) Patient reports pain as 2 on 0-10 scale.   Denies CP or SOB.  Voiding without difficulty. Positive flatus. Objective: Vital signs in last 24 hours: Temp:  [98.4 F (36.9 C)-99.7 F (37.6 C)] 99.7 F (37.6 C) (02/23 2035) Pulse Rate:  [82-87] 87  (02/23 2035) Resp:  [16-18] 18  (02/23 2035) BP: (122-158)/(51-68) 122/56 mmHg (02/23 2035) SpO2:  [95 %-96 %] 95 % (02/23 2035)  Intake/Output from previous day: 02/23 0701 - 02/24 0700 In: 2080 [P.O.:480; I.V.:1600] Out: 440 [Urine:440] Intake/Output this shift:    No results found for this basename: HGB:5 in the last 72 hours No results found for this basename: WBC:2,RBC:2,HCT:2,PLT:2 in the last 72 hours  Basename 08/15/11 0413 08/14/11 0440  NA 134* 136  K 3.9 3.9  CL 98 98  CO2 28 28  BUN 17 18  CREATININE 1.16 1.24  GLUCOSE 126* 147*  CALCIUM 8.6 8.8   No results found for this basename: LABPT:2,INR:2 in the last 72 hours  Neurologically intact Sensation intact distally Intact pulses distally Dorsiflexion/Plantar flexion intact No cellulitis present  Assessment/Plan: 2 Days Post-Op Procedure(s) (LRB): EXCISION/RELEASE BURSA HIP (Right) Advance diet Up with therapy Discharge to SNF when bed avail.  Eero Dini C 08/15/2011, 7:01 AM

## 2011-08-16 NOTE — Discharge Summary (Signed)
Physician Discharge Summary   Patient ID: [name] MRN: [id number] DOB/AGE: 1921/05/19 78 y.o.  Admit date: 08/13/2011 Discharge date: 08/16/2011  Primary Diagnosis: Bursitis of right hip Right hip mass  Admission Diagnoses: Past Medical History  Diagnosis Date  . Kidney disease   . Diabetes mellitus   . Hypertension   . MI (myocardial infarction)     1974  . Peripheral vascular disease     veinsi rerouted in both legs   . Pneumonia     hx of   . Anemia   . Blood transfusion     hx of 6 months ago   . GERD (gastroesophageal reflux disease)   . Arthritis     Discharge Diagnoses:  Active Problems:  Bursitis of right hip S/P bursectomy right hip S/P mass excision right hip  Procedure: Procedure(s) (LRB): EXCISION/RELEASE BURSA HIP (Right)   Consults: None  HPI: Mr. [name] presented to Dr. Jeannetta Knapp office with the chief complaint of right hip pain. Upon presentation he was two months out from when the symptoms began. The symptoms included include pain and pain at night, as well as a mass on the right hip.The patient had not gotten any relief of their symptoms with Cortisone injections. After injections, he was still having rather significant pain and swelling. He felt that the lump was getting larger over the lateral aspect of the right hip. MRI showed that the mass was fat necrosis but Dr. Darrelyn Knapp was concerned that the mass could be something more significant.    Laboratory Data: Hospital Outpatient Visit on 08/09/2011  Component Date Value Range Status  . WBC (K/uL) 08/09/2011 7.8  4.0-10.5 Final  . RBC (MIL/uL) 08/09/2011 3.60* 4.22-5.81 Final  . Hemoglobin (g/dL) 40/98/1191 47.8* 29.5-62.1 Final  . HCT (%) 08/09/2011 31.1* 39.0-52.0 Final  . MCV (fL) 08/09/2011 86.4  78.0-100.0 Final  . MCH (pg) 08/09/2011 28.3  26.0-34.0 Final  . MCHC (g/dL) 30/86/5784 69.6  29.5-28.4 Final  . RDW (%) 08/09/2011 15.0  11.5-15.5 Final  . Platelets (K/uL) 08/09/2011 204   150-400 Final  . Sodium (mEq/L) 08/09/2011 128* 135-145 Final  . Potassium (mEq/L) 08/09/2011 4.3  3.5-5.1 Final  . Chloride (mEq/L) 08/09/2011 91* 96-112 Final  . CO2 (mEq/L) 08/09/2011 26  19-32 Final  . Glucose, Bld (mg/dL) 13/24/4010 272* 53-66 Final  . BUN (mg/dL) 44/08/4740 33* 5-95 Final  . Creatinine, Ser (mg/dL) 63/87/5643 3.29* 5.18-8.41 Final  . Calcium (mg/dL) 66/11/3014 8.7  0.1-09.3 Final  . Total Protein (g/dL) 23/55/7322 6.8  0.2-5.4 Final  . Albumin (g/dL) 27/11/2374 2.7* 2.8-3.1 Final  . AST (U/L) 08/09/2011 188* 0-37 Final  . ALT (U/L) 08/09/2011 112* 0-53 Final  . Alkaline Phosphatase (U/L) 08/09/2011 69  39-117 Final  . Total Bilirubin (mg/dL) 51/76/1607 0.3  3.7-1.0 Final  . GFR calc non Af Amer (mL/min) 08/09/2011 33* >90 Final  . GFR calc Af Amer (mL/min) 08/09/2011 38* >90 Final   Comment:                                 The eGFR has been calculated                          using the CKD EPI equation.                          This calculation has not  been                          validated in all clinical                          situations.                          eGFR's persistently                          <90 mL/min signify                          possible Chronic Kidney Disease.  Marland Kitchen aPTT (seconds) 08/09/2011 43* 24-37 Final   Comment:                                 IF BASELINE aPTT IS ELEVATED,                          SUGGEST PATIENT RISK ASSESSMENT                          BE USED TO DETERMINE APPROPRIATE                          ANTICOAGULANT THERAPY.  . MRSA, PCR  08/09/2011 POSITIVE* NEGATIVE Final   Comment: RESULT CALLED TO, READ BACK BY AND VERIFIED WITH:                          SHOFFNERS/[id number]  . Staphylococcus aureus  08/09/2011 POSITIVE* NEGATIVE Final   Comment:                                 The Xpert SA Assay (FDA                          approved for NASAL specimens                          only), is one component  of                          a comprehensive surveillance                          program.  It is not intended                          to diagnose infection nor to                          guide or monitor treatment.                          RESULT CALLED TO, READ BACK BY AND VERIFIED WITH:  SHOFFNERS/[id number]  . Neutrophils Relative (%) 08/09/2011 73  43-77 Final  . Neutro Abs (K/uL) 08/09/2011 5.7  1.7-7.7 Final  . Lymphocytes Relative (%) 08/09/2011 11* 12-46 Final  . Lymphs Abs (K/uL) 08/09/2011 0.9  0.7-4.0 Final  . Monocytes Relative (%) 08/09/2011 13* 3-12 Final  . Monocytes Absolute (K/uL) 08/09/2011 1.0  0.1-1.0 Final  . Eosinophils Relative (%) 08/09/2011 3  0-5 Final  . Eosinophils Absolute (K/uL) 08/09/2011 0.2  0.0-0.7 Final  . Basophils Relative (%) 08/09/2011 0  0-1 Final  . Basophils Absolute (K/uL) 08/09/2011 0.0  0.0-0.1 Final  . Prothrombin Time (seconds) 08/09/2011 14.7  11.6-15.2 Final  . INR  08/09/2011 1.13  0.00-1.49 Final    Basename 08/15/11 0413 08/14/11 0440  NA 134* 136  K 3.9 3.9  CL 98 98  CO2 28 28  BUN 17 18  CREATININE 1.16 1.24  GLUCOSE 126* 147*  CALCIUM 8.6 8.8   X-Rays:Ct Abdomen Pelvis Wo Contrast  07/23/2011  *RADIOLOGY REPORT*  Clinical Data: Difficulty urinating, diabetes, kidney disease.  CT ABDOMEN AND PELVIS WITHOUT CONTRAST  Technique:  Multidetector CT imaging of the abdomen and pelvis was performed following the standard protocol without intravenous contrast.  Comparison: 06/13/2010  Findings: Trace pericardial effusion.  Coronary artery calcification.  Trace right and small left pleural effusions. Associated opacities; atelectasis versus pneumonia.  Mild interlobular septal prominence.  Intra-abdominal organ evaluation is limited without intravenous contrast.  Unchanged hypodensities within the liver.  Layering high attenuation within the gallbladder.  No gallbladder wall thickening or pericholecystic  fluid.  Calcified punctate foci scattered throughout the spleen.  Unremarkable pancreas, adrenal glands.  Bilateral lobular renal contours with lesions of varying complexity.  Further evaluation not possible without intravenous contrast.  No hydronephrosis or hydroureter. Questionable nonobstructing stones versus vascular calcifications.  No ureteral or bladder calculi identified.  Mild bilateral perinephric fat stranding is similar to prior.  No bowel obstruction.  No CT evidence for colitis.  Colonic diverticulosis.  Normal appendix.  No free intraperitoneal air or fluid.  Decompressed bladder.  Foley catheter in place.  Absent versus atrophic prostate gland. Vast deferens calcifications.  No lymphadenopathy.  Advanced atherosclerotic calcification of the aorta and branch vessels.  A left renal artery stent in place. Aneurysmal dilatation of the right greater than left common femoral arteries, just proximal to the bifurcation.  This is similar to the prior. Bilateral inguinal surgical clips.  Further vascular evaluation is not possible without intravenous contrast.  Soft tissue attenuation/stranding in the right lateral subcutaneous tissues of the thigh, overlying the right femoral neck.  Diffuse osteopenia.  Multilevel degenerative changes.  No acute osseous abnormality.  IMPRESSION: Trace pericardial effusion.  Trace right and small left pleural effusions.  Bibasilar opacities; atelectasis versus pneumonia.  Question interlobular septal prominence as can be seen with edema.  Gallbladder stones and/or sludge.  No CT evidence for cholecystitis.  Bilateral atrophic kidneys with perinephric fat stranding.  No hydronephrosis hydroureter.  Decompressed bladder with Foley catheter balloon in place.  Left renal artery stent in place.  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  Further vascular evaluation is not possible without intravenous contrast.  Original Report Authenticated By:  Waneta Martins, M.D.   Dg Chest 2 View  08/09/2011  *RADIOLOGY REPORT*  Clinical Data: Preop hip surgery  CHEST - 2 VIEW  Comparison: Most recent 03/16/2011.  Findings: Cardiac enlargement.  No visible mediastinal lesion. Clear lung fields. Mild chronic interstitial prominence. Calcified tortuous aorta.  No effusion or pneumothorax.  Upper lung zone and biapical scarring.  No osseous findings.  IMPRESSION: Cardiomegaly.  No active cardiopulmonary disease.  Original Report Authenticated By: Elsie Stain, M.D.    EKG: Orders placed during the hospital encounter of 02/24/11  . EKG  . EKG  . EKG  . EKG     Hospital Course: Patient was admitted to [facility] and taken to the OR and underwent the above state procedure without complications.  Patient tolerated the procedure well and was later transferred to the recovery room and then to the orthopaedic floor for postoperative care.  They were given PO and IV analgesics for pain control following their surgery.  Discharge planning consulted to help with postop disposition and equipment needs.  Patient had a rough night on the evening of surgery due to severe nausea and vomiting. Exam showed that his abdomen was distended and tender but he had active bowel sounds. He was kept for observation of an ileus.  The patient tolerated ambulation well with assistance during therapy.  Dressing was changed on day two and the incision was clean, dry, with no drainage . Nausea and vomiting relieved and patient progressed with therapy on post-op day two. Seen in rounds by on call physician and patient was discharged home.    Discharge Medications: Prior to Admission medications   Medication Sig Start Date End Date Taking? Authorizing Provider  aspirin EC 81 MG tablet Take 81 mg by mouth daily before breakfast.    Yes Historical Provider, MD  Calcium Carbonate-Vitamin D (CALCIUM 600 + D PO) Take 1 tablet by mouth 2 (two) times daily.    Yes Historical  Provider, MD  carvedilol (COREG) 25 MG tablet Take 25 mg by mouth 2 (two) times daily with a meal.   Yes Historical Provider, MD  desoximetasone (TOPICORT) 0.25 % cream Apply 1 application topically 2 (two) times daily as needed. For legs (itching)   Yes Historical Provider, MD  ferrous sulfate 325 (65 FE) MG tablet Take 325 mg by mouth daily with breakfast.   Yes Historical Provider, MD  finasteride (PROSCAR) 5 MG tablet Take 5 mg by mouth daily before breakfast.    Yes Historical Provider, MD  fish oil-omega-3 fatty acids 1000 MG capsule Take 1-2 g by mouth 2 (two) times daily. Takes 2 every morning and 1 at lunch   Yes Historical Provider, MD  losartan (COZAAR) 100 MG tablet Take 100 mg by mouth daily before breakfast.    Yes Historical Provider, MD  mirtazapine (REMERON) 30 MG tablet Take 30 mg by mouth at bedtime.   Yes Historical Provider, MD  Multiple Vitamin (MULITIVITAMIN WITH MINERALS) TABS Take 1 tablet by mouth daily before breakfast.    Yes Historical Provider, MD  omeprazole (PRILOSEC) 20 MG capsule Take 20 mg by mouth daily before breakfast.    Yes Historical Provider, MD  polyethylene glycol (MIRALAX / GLYCOLAX) packet Take 17 g by mouth daily before breakfast.    Yes Historical Provider, MD  rosuvastatin (CRESTOR) 20 MG tablet Take 20 mg by mouth at bedtime.    Yes Historical Provider, MD  sitaGLIPtin (JANUVIA) 50 MG tablet Take 25 mg by mouth daily before breakfast.    Yes Historical Provider, MD  Tamsulosin HCl (FLOMAX) 0.4 MG CAPS Take 0.4 mg by mouth daily after supper.    Yes Historical Provider, MD  vitamin B-12 (CYANOCOBALAMIN) 1000 MCG tablet Take 1,000 mcg by mouth daily before breakfast.    Yes  Historical Provider, MD  vitamin C (ASCORBIC ACID) 500 MG tablet Take 500 mg by mouth daily before breakfast.    Yes Historical Provider, MD  zaleplon (SONATA) 5 MG capsule Take 5 mg by mouth at bedtime.   Yes Historical Provider, MD  acetaminophen (TYLENOL) 325 MG tablet Take 2  tablets (650 mg total) by mouth every 6 (six) hours as needed. 08/15/11 08/14/12  Liam Graham, PA  aspirin EC 81 MG EC tablet Take 1 tablet (81 mg total) by mouth daily before breakfast. 08/15/11 08/14/12  Liam Graham, PA  oxyCODONE-acetaminophen (PERCOCET) 5-325 MG per tablet Take 1-2 tablets by mouth every 4 (four) hours as needed for pain. 08/13/11 08/23/11  Edker Punt Tamala Ser, PA    Diet: Regular  Activity: WBAT  Follow-up: in 2 weeks  Disposition: Home  Discharged Condition: Good   Discharge Orders    Future Orders Please Complete By Expires   Diet - low sodium heart healthy      Diet - low sodium heart healthy      Call MD / Call 911      Comments:   If you experience chest pain or shortness of breath, CALL 911 and be transported to the hospital emergency room.  If you develope a fever above 101 F, pus (white drainage) or increased drainage or redness at the wound, or calf pain, call your surgeon's office.   Constipation Prevention      Comments:   Drink plenty of fluids.  Prune juice may be helpful.  You may use a stool softener, such as Colace (over the counter) 100 mg twice a day.  Use MiraLax (over the counter) for constipation as needed.   Increase activity slowly as tolerated      Weight Bearing as taught in Physical Therapy      Comments:   Use a walker or crutches as instructed.   Driving restrictions      Comments:   No driving while on pain meds   Discharge instructions      Comments:   Walk with your walker. Weight bearing as instructed. Change your dressing daily. Shower only, no tub bath. Call if any temperatures greater than 101 or any wound complications: [phone] during the day and ask for Dr. Jeannetta Knapp nurse, Mackey Birchwood.   Call MD / Call 911      Comments:   If you experience chest pain or shortness of breath, CALL 911 and be transported to the hospital emergency room.  If you develope a fever above 101 F, pus (white drainage) or  increased drainage or redness at the wound, or calf pain, call your surgeon's office.   Constipation Prevention      Comments:   Drink plenty of fluids.  Prune juice may be helpful.  You may use a stool softener, such as Colace (over the counter) 100 mg twice a day.  Use MiraLax (over the counter) for constipation as needed.   Increase activity slowly as tolerated      Weight Bearing as taught in Physical Therapy      Comments:   Use a walker or crutches as instructed.   Discharge instructions      Comments:   Walk with your walker. Weight bearing as instructed. Change your dressing daily. Shower only, no tub bath. Call if any temperatures greater than 101 or any wound complications: [phone] during the day and ask for Dr. Jeannetta Knapp nurse, Mackey Birchwood.   Driving restrictions  Comments:   No driving while on pain meds     Medication List  As of 08/16/2011  9:08 AM   STOP taking these medications         oxyCODONE-acetaminophen 7.5-325 MG per tablet         TAKE these medications         acetaminophen 325 MG tablet   Commonly known as: TYLENOL   Take 2 tablets (650 mg total) by mouth every 6 (six) hours as needed.      aspirin EC 81 MG tablet   Take 81 mg by mouth daily before breakfast.      aspirin 81 MG EC tablet   Take 1 tablet (81 mg total) by mouth daily before breakfast.      CALCIUM 600 + D PO   Take 1 tablet by mouth 2 (two) times daily.      carvedilol 25 MG tablet   Commonly known as: COREG   Take 25 mg by mouth 2 (two) times daily with a meal.      desoximetasone 0.25 % cream   Commonly known as: TOPICORT   Apply 1 application topically 2 (two) times daily as needed. For legs (itching)      ferrous sulfate 325 (65 FE) MG tablet   Take 325 mg by mouth daily with breakfast.      finasteride 5 MG tablet   Commonly known as: PROSCAR   Take 5 mg by mouth daily before breakfast.      fish oil-omega-3 fatty acids 1000 MG capsule   Take 1-2 g by mouth  2 (two) times daily. Takes 2 every morning and 1 at lunch      losartan 100 MG tablet   Commonly known as: COZAAR   Take 100 mg by mouth daily before breakfast.      mirtazapine 30 MG tablet   Commonly known as: REMERON   Take 30 mg by mouth at bedtime.      mulitivitamin with minerals Tabs   Take 1 tablet by mouth daily before breakfast.      omeprazole 20 MG capsule   Commonly known as: PRILOSEC   Take 20 mg by mouth daily before breakfast.      oxyCODONE-acetaminophen 5-325 MG per tablet   Commonly known as: PERCOCET   Take 1-2 tablets by mouth every 4 (four) hours as needed for pain.      polyethylene glycol packet   Commonly known as: MIRALAX / GLYCOLAX   Take 17 g by mouth daily before breakfast.      rosuvastatin 20 MG tablet   Commonly known as: CRESTOR   Take 20 mg by mouth at bedtime.      sitaGLIPtin 50 MG tablet   Commonly known as: JANUVIA   Take 25 mg by mouth daily before breakfast.      Tamsulosin HCl 0.4 MG Caps   Commonly known as: FLOMAX   Take 0.4 mg by mouth daily after supper.      vitamin B-12 1000 MCG tablet   Commonly known as: CYANOCOBALAMIN   Take 1,000 mcg by mouth daily before breakfast.      vitamin C 500 MG tablet   Commonly known as: ASCORBIC ACID   Take 500 mg by mouth daily before breakfast.      zaleplon 5 MG capsule   Commonly known as: SONATA   Take 5 mg by mouth at bedtime.  Signed: Reylynn Vanalstine LAUREN 08/16/2011, 9:08 AM

## 2011-08-23 ENCOUNTER — Encounter (HOSPITAL_COMMUNITY): Payer: Self-pay | Admitting: Orthopedic Surgery

## 2011-10-09 ENCOUNTER — Emergency Department (HOSPITAL_COMMUNITY)
Admission: EM | Admit: 2011-10-09 | Discharge: 2011-10-09 | Disposition: A | Discharge status: Home / Self Care | Payer: Medicare Other | Attending: Emergency Medicine | Admitting: Emergency Medicine

## 2011-10-09 ENCOUNTER — Encounter (HOSPITAL_COMMUNITY): Payer: Self-pay | Admitting: *Deleted

## 2011-10-09 DIAGNOSIS — I252 Old myocardial infarction: Secondary | ICD-10-CM | POA: Insufficient documentation

## 2011-10-09 DIAGNOSIS — B49 Unspecified mycosis: Secondary | ICD-10-CM | POA: Insufficient documentation

## 2011-10-09 DIAGNOSIS — Z8701 Personal history of pneumonia (recurrent): Secondary | ICD-10-CM | POA: Insufficient documentation

## 2011-10-09 DIAGNOSIS — E119 Type 2 diabetes mellitus without complications: Secondary | ICD-10-CM | POA: Insufficient documentation

## 2011-10-09 DIAGNOSIS — I10 Essential (primary) hypertension: Secondary | ICD-10-CM | POA: Insufficient documentation

## 2011-10-09 MED ORDER — CLOTRIMAZOLE 1 % EX CREA
TOPICAL_CREAM | Freq: Two times a day (BID) | CUTANEOUS | Status: DC
  Administered 2011-10-09: 1 via TOPICAL
  Filled 2011-10-09: qty 15

## 2011-10-09 NOTE — ED Provider Notes (Signed)
History     CSN: [id number]  Arrival date & time 10/09/11  4098   First MD Initiated Contact with Patient 10/09/11 2151      Chief Complaint  Patient presents with  . Wound Infection    (Consider location/radiation/quality/duration/timing/severity/associated sxs/prior treatment) The history is provided by the patient.   patient here with rash to his gluteal folds for a month and a half. Has been seen by his doctor for same and given topical antibiotics as well as steroid creams without relief. Denies any fever. Pain has been treated with Percocets. No vomiting. Some chronic constipation. Pain is sharp in nature and worse with movement  Past Medical History  Diagnosis Date  . Kidney disease   . Diabetes mellitus   . Hypertension   . MI (myocardial infarction)     1974  . Peripheral vascular disease     veinsi rerouted in both legs   . Pneumonia     hx of   . Anemia   . Blood transfusion     hx of 6 months ago   . GERD (gastroesophageal reflux disease)   . Arthritis     Past Surgical History  Procedure Date  . Back surgery     back surgery x 2   . Eye surgery     bilateral cataract surgery with implants   . Other surgical history     2 fatty tumors removed - on different arms   . Other surgical history     prostate surgery x 3   . Other surgical history     veins rerouted in both legs   . Other surgical history     hemorrhoid surgery  . Other surgical history     carpal tunnel right   . Other surgical history     big toe nails removed   . Other surgical history     repair of blood vessel in esophagus and stomach- 2006  . Other surgical history     bilateral kidney stents  2007  . Other surgical history     small bowel obstruction 2011   . Excision/release bursa hip 08/13/2011    Procedure: EXCISION/RELEASE BURSA HIP;  Surgeon: Jacki Cones, MD;  Location: [facility];  Service: Orthopedics;  Laterality: Right;  Right Hip Excision Tumor    History reviewed.  No pertinent family history.  History  Substance Use Topics  . Smoking status: Former Smoker    Quit date: 07/22/1972  . Smokeless tobacco: Never Used  . Alcohol Use: No      Review of Systems  All other systems reviewed and are negative.    Allergies  Amoxicillin; Apresoline esidrix; and Biaxin  Home Medications   Current Outpatient Rx  Name Route Sig Dispense Refill  . ACETAMINOPHEN 325 MG PO TABS Oral Take 650 mg by mouth every 6 (six) hours as needed.    . ASPIRIN EC 81 MG PO TBEC Oral Take 81 mg by mouth daily before breakfast.     . CALCIUM CARBONATE-VITAMIN D 500-200 MG-UNIT PO TABS Oral Take 2 tablets by mouth daily.    Marland Kitchen CARVEDILOL 25 MG PO TABS Oral Take 25 mg by mouth 2 (two) times daily with a meal.    . DESOXIMETASONE 0.25 % EX CREA Topical Apply 1 application topically 2 (two) times daily as needed. For legs (itching)    . FERROUS SULFATE 325 (65 FE) MG PO TABS Oral Take 325 mg by mouth daily with breakfast.    .  FINASTERIDE 5 MG PO TABS Oral Take 5 mg by mouth daily before breakfast.     . FUROSEMIDE 40 MG PO TABS Oral Take 40 mg by mouth 2 (two) times daily.    Marland Kitchen LOSARTAN POTASSIUM 100 MG PO TABS Oral Take 100 mg by mouth daily before breakfast.     . MIRTAZAPINE 30 MG PO TABS Oral Take 30 mg by mouth at bedtime.    . ADULT MULTIVITAMIN W/MINERALS CH Oral Take 1 tablet by mouth daily before breakfast.     . OMEGA-3-ACID ETHYL ESTERS 1 G PO CAPS Oral Take 1 g by mouth 3 (three) times daily.    Marland Kitchen OMEPRAZOLE 20 MG PO CPDR Oral Take 20 mg by mouth daily before breakfast.     . OXYCODONE-ACETAMINOPHEN 5-325 MG PO TABS Oral Take 1 tablet by mouth every 4 (four) hours as needed. Hip pain    . POLYETHYLENE GLYCOL 3350 PO PACK Oral Take 17 g by mouth daily before breakfast.     . ROSUVASTATIN CALCIUM 20 MG PO TABS Oral Take 20 mg by mouth at bedtime.     Marland Kitchen SITAGLIPTIN PHOSPHATE 50 MG PO TABS Oral Take 50 mg by mouth daily before breakfast.     . TAMSULOSIN HCL 0.4 MG  PO CAPS Oral Take 0.4 mg by mouth daily after supper.     Marland Kitchen VITAMIN B-12 1000 MCG PO TABS Oral Take 1,000 mcg by mouth daily before breakfast.     . VITAMIN C 500 MG PO TABS Oral Take 500 mg by mouth daily before breakfast.     . ZALEPLON 5 MG PO CAPS Oral Take 5 mg by mouth at bedtime.      BP 138/57  Pulse 66  Temp(Src) 98.3 F (36.8 C) (Oral)  Resp 16  SpO2 97%  Physical Exam  Nursing note and vitals reviewed. Constitutional: He is oriented to person, place, and time. Vital signs are normal. He appears well-developed and well-nourished.  Non-toxic appearance. No distress.  HENT:  Head: Normocephalic and atraumatic.  Eyes: Conjunctivae, EOM and lids are normal. Pupils are equal, round, and reactive to light.  Neck: Normal range of motion. Neck supple. No tracheal deviation present. No mass present.  Cardiovascular: Normal rate, regular rhythm and normal heart sounds.  Exam reveals no gallop.   No murmur heard. Pulmonary/Chest: Effort normal and breath sounds normal. No stridor. No respiratory distress. He has no decreased breath sounds. He has no wheezes. He has no rhonchi. He has no rales.  Abdominal: Soft. Normal appearance and bowel sounds are normal. He exhibits no distension. There is no tenderness. There is no rebound and no CVA tenderness.  Musculoskeletal: Normal range of motion. He exhibits no edema and no tenderness.  Neurological: He is alert and oriented to person, place, and time. He has normal strength. No cranial nerve deficit or sensory deficit. GCS eye subscore is 4. GCS verbal subscore is 5. GCS motor subscore is 6.  Skin: Skin is warm and dry. No abrasion and no rash noted.       Erythematous macular area at the gluteal folds close to the perineum. No crepitus. No obvious wound drainage.  Psychiatric: He has a normal mood and affect. His speech is normal and behavior is normal.    ED Course  Procedures (including critical care time)  Labs Reviewed - No data to  display No results found.   No diagnosis found.    MDM  Patient appears to have a fungal  infection in his perineum. He was given Lotrimin cream here and will followup with his Dr. next week        Toy Baker, MD 10/09/11 2214

## 2011-10-09 NOTE — Discharge Instructions (Signed)
Use the lotrimin cream to your buttocks twice a day for 10 days or until the rash is gone--follow up with dr. Renne Crigler next week or return here for fever or worsening symptoms

## 2011-10-09 NOTE — ED Notes (Signed)
Patient discharge via ambulatory with walker with spouse. Respirations equal and unlabored. Skin warm and dry. No acute distress noted.

## 2011-10-09 NOTE — ED Notes (Signed)
Pt recently had hip surgery. Pt c/o sore in gluteal crease, believes from immobility. Pt also has difficulty w/ constipation.

## 2011-11-05 ENCOUNTER — Emergency Department (HOSPITAL_COMMUNITY)
Admission: EM | Admit: 2011-11-05 | Discharge: 2011-11-05 | Disposition: A | Discharge status: Home / Self Care | Payer: Medicare Other | Attending: Emergency Medicine | Admitting: Emergency Medicine

## 2011-11-05 ENCOUNTER — Encounter (HOSPITAL_COMMUNITY): Payer: Self-pay | Admitting: Emergency Medicine

## 2011-11-05 DIAGNOSIS — E119 Type 2 diabetes mellitus without complications: Secondary | ICD-10-CM | POA: Insufficient documentation

## 2011-11-05 DIAGNOSIS — N289 Disorder of kidney and ureter, unspecified: Secondary | ICD-10-CM | POA: Insufficient documentation

## 2011-11-05 DIAGNOSIS — R109 Unspecified abdominal pain: Secondary | ICD-10-CM | POA: Insufficient documentation

## 2011-11-05 DIAGNOSIS — M129 Arthropathy, unspecified: Secondary | ICD-10-CM | POA: Insufficient documentation

## 2011-11-05 DIAGNOSIS — Z7982 Long term (current) use of aspirin: Secondary | ICD-10-CM | POA: Insufficient documentation

## 2011-11-05 DIAGNOSIS — R300 Dysuria: Secondary | ICD-10-CM | POA: Insufficient documentation

## 2011-11-05 DIAGNOSIS — R350 Frequency of micturition: Secondary | ICD-10-CM | POA: Insufficient documentation

## 2011-11-05 DIAGNOSIS — I10 Essential (primary) hypertension: Secondary | ICD-10-CM | POA: Insufficient documentation

## 2011-11-05 DIAGNOSIS — R339 Retention of urine, unspecified: Secondary | ICD-10-CM | POA: Insufficient documentation

## 2011-11-05 DIAGNOSIS — Z79899 Other long term (current) drug therapy: Secondary | ICD-10-CM | POA: Insufficient documentation

## 2011-11-05 DIAGNOSIS — I252 Old myocardial infarction: Secondary | ICD-10-CM | POA: Insufficient documentation

## 2011-11-05 DIAGNOSIS — K219 Gastro-esophageal reflux disease without esophagitis: Secondary | ICD-10-CM | POA: Insufficient documentation

## 2011-11-05 DIAGNOSIS — I739 Peripheral vascular disease, unspecified: Secondary | ICD-10-CM | POA: Insufficient documentation

## 2011-11-05 LAB — URINALYSIS, ROUTINE W REFLEX MICROSCOPIC
Ketones, ur: NEGATIVE mg/dL
Leukocytes, UA: NEGATIVE
Nitrite: NEGATIVE
Protein, ur: NEGATIVE mg/dL

## 2011-11-05 LAB — DIFFERENTIAL
Basophils Absolute: 0 10*3/uL (ref 0.0–0.1)
Basophils Relative: 0 % (ref 0–1)
Eosinophils Absolute: 0.3 10*3/uL (ref 0.0–0.7)
Monocytes Absolute: 1 10*3/uL (ref 0.1–1.0)
Neutro Abs: 8.1 10*3/uL — ABNORMAL HIGH (ref 1.7–7.7)
Neutrophils Relative %: 73 % (ref 43–77)

## 2011-11-05 LAB — BASIC METABOLIC PANEL
Chloride: 93 mEq/L — ABNORMAL LOW (ref 96–112)
Creatinine, Ser: 1.51 mg/dL — ABNORMAL HIGH (ref 0.50–1.35)
GFR calc Af Amer: 46 mL/min — ABNORMAL LOW (ref >90)
GFR calc non Af Amer: 39 mL/min — ABNORMAL LOW (ref >90)

## 2011-11-05 LAB — CBC
MCHC: 32.8 g/dL (ref 30.0–36.0)
RDW: 16 % — ABNORMAL HIGH (ref 11.5–15.5)

## 2011-11-05 NOTE — ED Provider Notes (Signed)
History     CSN: [id number]  Arrival date & time 11/05/11  0543   First MD Initiated Contact with Patient 11/05/11 [phone]      Chief Complaint  Patient presents with  . Dysuria    (Consider location/radiation/quality/duration/timing/severity/associated sxs/prior treatment) HPI Comments: Patient reports he has been unable to urinate since 9:00 last night.  Feels that his abdomen is swollen and is having suprapubic discomfort.  States he usually has urinary frequency and has had stents placed "in his kidneys" several times by Dr Annabell Howells.  Prior to last night, pt has had abdominal discomfort for a few weeks, improved with dramamine.  Denies fevers, N/V.  Denies any urinary symptoms prior to inability to urinate (states he "always" has frequency).    Patient is a 78 y.o. male presenting with dysuria. The history is provided by the patient and the spouse.  Dysuria  Associated symptoms include frequency. Pertinent negatives include no chills, no nausea, no vomiting and no urgency.    Past Medical History  Diagnosis Date  . Kidney disease   . Diabetes mellitus   . Hypertension   . MI (myocardial infarction)     1974  . Peripheral vascular disease     veinsi rerouted in both legs   . Pneumonia     hx of   . Anemia   . Blood transfusion     hx of 6 months ago   . GERD (gastroesophageal reflux disease)   . Arthritis     Past Surgical History  Procedure Date  . Back surgery     back surgery x 2   . Eye surgery     bilateral cataract surgery with implants   . Other surgical history     2 fatty tumors removed - on different arms   . Other surgical history     prostate surgery x 3   . Other surgical history     veins rerouted in both legs   . Other surgical history     hemorrhoid surgery  . Other surgical history     carpal tunnel right   . Other surgical history     big toe nails removed   . Other surgical history     repair of blood vessel in esophagus and stomach- 2006  .  Other surgical history     bilateral kidney stents  2007  . Other surgical history     small bowel obstruction 2011   . Excision/release bursa hip 08/13/2011    Procedure: EXCISION/RELEASE BURSA HIP;  Surgeon: Jacki Cones, MD;  Location: [facility];  Service: Orthopedics;  Laterality: Right;  Right Hip Excision Tumor    No family history on file.  History  Substance Use Topics  . Smoking status: Former Smoker    Quit date: 07/22/1972  . Smokeless tobacco: Never Used  . Alcohol Use: No      Review of Systems  Constitutional: Negative for fever and chills.  HENT: Negative for sore throat.   Respiratory: Negative for cough and shortness of breath.   Cardiovascular: Negative for chest pain.  Gastrointestinal: Positive for abdominal pain. Negative for nausea, vomiting, diarrhea and constipation.  Genitourinary: Positive for frequency. Negative for dysuria and urgency.    Allergies  Amoxicillin; Apresoline esidrix; and Clarithromycin  Home Medications   Current Outpatient Rx  Name Route Sig Dispense Refill  . ASPIRIN EC 81 MG PO TBEC Oral Take 81 mg by mouth daily before breakfast.     .  CALCIUM CARBONATE-VITAMIN D 500-200 MG-UNIT PO TABS Oral Take 2 tablets by mouth daily.    Marland Kitchen CARVEDILOL 25 MG PO TABS Oral Take 25 mg by mouth 2 (two) times daily with a meal.    . DESOXIMETASONE 0.25 % EX CREA Topical Apply 1 application topically 2 (two) times daily as needed. For legs (itching)    . FERROUS SULFATE 325 (65 FE) MG PO TABS Oral Take 325 mg by mouth daily with breakfast.    . FINASTERIDE 5 MG PO TABS Oral Take 5 mg by mouth daily before breakfast.     . FUROSEMIDE 40 MG PO TABS Oral Take 40 mg by mouth 2 (two) times daily.    Marland Kitchen LOSARTAN POTASSIUM 100 MG PO TABS Oral Take 100 mg by mouth daily before breakfast.     . MIRTAZAPINE 30 MG PO TABS Oral Take 30 mg by mouth at bedtime.    . ADULT MULTIVITAMIN W/MINERALS CH Oral Take 1 tablet by mouth daily before breakfast.     .  OMEGA-3-ACID ETHYL ESTERS 1 G PO CAPS Oral Take 1 g by mouth 3 (three) times daily.    Marland Kitchen PRILOSEC PO Oral Take 15 mg by mouth daily.    . OXYCODONE-ACETAMINOPHEN 5-325 MG PO TABS Oral Take 1 tablet by mouth every 4 (four) hours as needed. Hip pain    . POLYETHYLENE GLYCOL 3350 PO PACK Oral Take 17 g by mouth daily before breakfast.     . ROSUVASTATIN CALCIUM 20 MG PO TABS Oral Take 20 mg by mouth at bedtime.     Marland Kitchen SITAGLIPTIN PHOSPHATE 50 MG PO TABS Oral Take 50 mg by mouth daily before breakfast.     . TAMSULOSIN HCL 0.4 MG PO CAPS Oral Take 0.4 mg by mouth daily after supper.     Marland Kitchen VITAMIN B-12 1000 MCG PO TABS Oral Take 1,000 mcg by mouth daily before breakfast.     . VITAMIN C 500 MG PO TABS Oral Take 500 mg by mouth daily before breakfast.     . ZALEPLON 5 MG PO CAPS Oral Take 5 mg by mouth at bedtime.      BP 165/76  Pulse 76  Temp 98.2 F (36.8 C)  Resp 18  Ht 5\' 9"  (1.753 m)  Wt 159 lb (72.122 kg)  BMI 23.48 kg/m2  SpO2 97%  Physical Exam  Nursing note and vitals reviewed. Constitutional: He is oriented to person, place, and time. He appears well-developed and well-nourished. No distress.  HENT:  Head: Normocephalic and atraumatic.  Neck: Neck supple.  Cardiovascular: Normal rate, regular rhythm and normal heart sounds.   Pulmonary/Chest: Breath sounds normal. No respiratory distress. He has no wheezes. He has no rales. He exhibits no tenderness.  Abdominal: Soft. Bowel sounds are normal. He exhibits no distension. There is tenderness in the suprapubic area. There is no rebound, no guarding and no CVA tenderness.       Mild suprapubic tenderness  Neurological: He is alert and oriented to person, place, and time.  Skin: He is not diaphoretic.    ED Course  Procedures (including critical care time)  Labs Reviewed  CBC - Abnormal; Notable for the following:    WBC 11.1 (*)    RBC 3.92 (*)    Hemoglobin 10.7 (*)    HCT 32.6 (*)    RDW 16.0 (*)    All other components  within normal limits  DIFFERENTIAL - Abnormal; Notable for the following:    Neutro  Abs 8.1 (*)    All other components within normal limits  BASIC METABOLIC PANEL - Abnormal; Notable for the following:    Sodium 132 (*)    Chloride 93 (*)    Glucose, Bld 125 (*)    BUN 30 (*)    Creatinine, Ser 1.51 (*)    GFR calc non Af Amer 39 (*)    GFR calc Af Amer 46 (*)    All other components within normal limits  URINALYSIS, ROUTINE W REFLEX MICROSCOPIC  URINE CULTURE   No results found.  6:33 AM Patient seen and examined.  Nurse Alinda Money) has placed a foley catheter with no return.  I have asked that he do a bladder scan.  Charge nurse suggests irrigating foley.  Will continue to follow.    6:36 AM Dr Read Drivers aware of and will see patient.    7:27 AM Patient reports relief with foley.  Approximately 500cc drained.    1. Renal insufficiency   2. Urinary retention       MDM  Elderly patient with urinary retention since last night, foley placed that drained 500cc.  Patient with worsening renal function.  Discussed results with Dr Read Drivers and with patient and his spouse.  I have asked patient and spouse to have renal function rechecked next week.  Pt has flomax at home, though he is currently not allowed to take it pending study on Monday for swallowing disfunction (states bottle states not to break or crush pill).  Pt d/c home with foley in place (pt has had this done several times in the past and is familiar with care), urology (Dr Annabell Howells) follow up.  Return precautions given.  Patient verbalizes understanding and agrees with plan.          Dillard Cannon [place], [place] 11/05/11 [phone]

## 2011-11-05 NOTE — ED Notes (Signed)
Pt c/o is unable to void. Pt has the urge to void.

## 2011-11-05 NOTE — ED Provider Notes (Signed)
Medical screening examination/treatment/procedure(s) were conducted as a shared visit with non-physician practitioner(s) and myself.  I personally evaluated the patient during the encounter  6:39 AM Patient has had about 200 mL urine output after placement of Foley catheter. His bladder is still somewhat distended but not tender. The patient states that his urge to void has improved.  Hanley Seamen, MD 11/05/11 [phone]

## 2011-11-05 NOTE — Discharge Instructions (Signed)
Please call Dr Belva Crome office today to schedule a follow up appointment  Start taking the Flomax again when you are able.  If you develop fevers, abdominal pain, nausea and vomiting, or inability to tolerate fluids by mouth, return to the ER immediately.  You may return to the ER at any time for worsening condition or any new symptoms that concern you.  Acute Urinary Retention, Male You have been seen by a caregiver today because of your inability to urinate (pass your water). This is a common problem in elderly males. As men age their prostates become larger and block the flow of urine from the bladder. This is usually a problem that has come on gradually. It is often first noticed by having to get up at night to urinate. This is because as the prostate enlarges it is more difficult to empty the bladder completely. Treatment may involve a one time catheterization to empty the bladder. This is putting in a tube to drain your urine. Then you and your personal caregiver can decide at your earliest convenience how to handle this problem in the future. It may also be a problem that may not recur for years. Sometimes this problem can be caused by medications. In this case, all that is often necessary is to discontinue the offending agent. If you are to leave the foley catheter (a long, narrow, hollow tube) in and go home with a drainage system, you will need to discuss the best course of action with your caregiver. While the catheter is in, maintain a good intake of fluids. Keep the drainage bag emptied and lower than your catheter. This is so contaminated (infected) urine will not be flowing back into your bladder. This could lead to a urinary tract infection. Only take over-the-counter or prescription medicines for pain, discomfort, or fever as directed by your caregiver.  SEEK IMMEDIATE MEDICAL CARE IF:  You develop chills, fever, or show signs of generalized illness that occurs prior to seeing your  caregiver. Document Released: 09/13/2000 Document Revised: 05/27/2011 Document Reviewed: 05/29/2008 [facility] Patient Information 2012 [place], [place].

## 2011-11-06 ENCOUNTER — Emergency Department (HOSPITAL_COMMUNITY)
Admission: EM | Admit: 2011-11-06 | Discharge: 2011-11-06 | Disposition: A | Discharge status: Home / Self Care | Payer: Medicare Other | Attending: Emergency Medicine | Admitting: Emergency Medicine

## 2011-11-06 ENCOUNTER — Encounter (HOSPITAL_COMMUNITY): Payer: Self-pay | Admitting: *Deleted

## 2011-11-06 DIAGNOSIS — I739 Peripheral vascular disease, unspecified: Secondary | ICD-10-CM | POA: Insufficient documentation

## 2011-11-06 DIAGNOSIS — N189 Chronic kidney disease, unspecified: Secondary | ICD-10-CM | POA: Insufficient documentation

## 2011-11-06 DIAGNOSIS — K219 Gastro-esophageal reflux disease without esophagitis: Secondary | ICD-10-CM | POA: Insufficient documentation

## 2011-11-06 DIAGNOSIS — I129 Hypertensive chronic kidney disease with stage 1 through stage 4 chronic kidney disease, or unspecified chronic kidney disease: Secondary | ICD-10-CM | POA: Insufficient documentation

## 2011-11-06 DIAGNOSIS — T839XXA Unspecified complication of genitourinary prosthetic device, implant and graft, initial encounter: Secondary | ICD-10-CM

## 2011-11-06 DIAGNOSIS — E119 Type 2 diabetes mellitus without complications: Secondary | ICD-10-CM | POA: Insufficient documentation

## 2011-11-06 DIAGNOSIS — Z7982 Long term (current) use of aspirin: Secondary | ICD-10-CM | POA: Insufficient documentation

## 2011-11-06 DIAGNOSIS — I252 Old myocardial infarction: Secondary | ICD-10-CM | POA: Insufficient documentation

## 2011-11-06 DIAGNOSIS — Y846 Urinary catheterization as the cause of abnormal reaction of the patient, or of later complication, without mention of misadventure at the time of the procedure: Secondary | ICD-10-CM | POA: Insufficient documentation

## 2011-11-06 DIAGNOSIS — M129 Arthropathy, unspecified: Secondary | ICD-10-CM | POA: Insufficient documentation

## 2011-11-06 DIAGNOSIS — Z79899 Other long term (current) drug therapy: Secondary | ICD-10-CM | POA: Insufficient documentation

## 2011-11-06 DIAGNOSIS — T83091A Other mechanical complication of indwelling urethral catheter, initial encounter: Secondary | ICD-10-CM | POA: Insufficient documentation

## 2011-11-06 LAB — URINE CULTURE: Colony Count: NO GROWTH

## 2011-11-06 NOTE — Discharge Instructions (Signed)
Foley Catheter Care, Adult A soft, flexible tube (Foley catheter) has been placed in your bladder. This may be done to temporarily help with urine drainage after an operation or to relieve blockage from an enlarged prostate gland. HOME CARE INSTRUCTIONS  If you are going home with a Foley catheter in place, follow these instructions: Taking Care of the Catheter:  Keep the area where the catheter leaves your body clean.   Attach the catheter to the leg so there is no tension on the catheter.   Keep the drainage bag below the level of the bladder, but keep it OFF the floor.   Do not take long soaking baths. Your caregiver will give instructions about showering.   Wash your hands before touching ANYTHING related to the catheter or bag.   Using mild soap and warm water on a washcloth:   Clean the area closest to the catheter insertion site using a circular motion around the catheter.   Clean the catheter itself by wiping AWAY from the insertion site for several inches down the tube.   NEVER wipe upward as this could sweep bacteria up into the urethra (tube in your body that normally drains the bladder) and cause infection.  Taking Care of the Drainage Bags:  Two drainage bags will be taken home: a large overnight drainage bag, and a smaller leg bag which fits underneath clothing.   It is okay to wear the overnight bag at any time, but NEVER wear the smaller leg bag at night.   Keep the drainage bag well below the level of your bladder. This prevents backflow of urine into the bladder and allows the urine to drain freely.   Anchor the tubing to your leg to prevent pulling or tension on the catheter. Use tape or a leg strap provided by the hospital.   Empty the drainage bag when it is  to  full. Wash your hands before and after touching the bag.   Periodically check the tubing for kinks to make sure there is no pressure on the tubing which could restrict the flow of urine.  Changing  the Drainage Bags:  Cleanse both ends of the clean bag with alcohol before changing.   Pinch off the rubber catheter to avoid urine spillage during the disconnection.   Disconnect the dirty bag and connect the clean one.   Empty the dirty bag carefully to avoid a urine spill.   Attach the new bag to the leg with tape or a leg strap.  Cleaning the Drainage Bags:  Whenever a drainage bag is disconnected, it must be cleaned quickly so it is ready for the next use.   Wash the bag in warm, soapy water.   Rinse the bag thoroughly with warm water.   Soak the bag for 30 minutes in a solution of white vinegar and water (1 cup vinegar to 1 quart warm water).   Rinse with warm water.  SEEK MEDICAL CARE IF:   Some pain develops in the kidney (lower back) area.   The urine is cloudy or smells bad.   There is some blood in the urine.   The catheter becomes clogged and/or there is no urine drainage.  SEEK IMMEDIATE MEDICAL CARE IF:   You have moderate or severe pain in the kidney region.   You start to throw up (vomit).   Blood fills the tube.   Worsening belly (abdominal) pain develops.   You have a fever.  MAKE SURE YOU:  You have a fever.  MAKE SURE YOU:    Understand these instructions.   Will watch your condition.   Will get help right away if you are not doing well or get worse.  Document Released: 06/07/2005 Document Revised: 05/27/2011 Document Reviewed: 12/02/2006  [employer] Patient Information 2012 [employer].

## 2011-11-06 NOTE — ED Notes (Signed)
Pt from home with reports of foley catheter that was placed yesterday here at [facility] ED began to leak this morning. Pt denies pain.

## 2011-11-06 NOTE — ED Provider Notes (Signed)
Medical screening examination/treatment/procedure(s) were performed by non-physician practitioner and as supervising physician I was immediately available for consultation/collaboration.  Cheri Guppy, MD 11/06/11 1626

## 2011-11-06 NOTE — ED Notes (Signed)
Pt with indwelling foley cathter from 5/17. Today pt is c/o leaking around the insertion site. Pt denies inability to empty bladder. Pt states only complaint is the leaking and "messing up my clothes."

## 2011-11-06 NOTE — ED Provider Notes (Signed)
History     CSN: [id number]  Arrival date & time 11/06/11  1050   First MD Initiated Contact with Patient 11/06/11 1109      Chief Complaint  Patient presents with  . Foley catheter leaking     (Consider location/radiation/quality/duration/timing/severity/associated sxs/prior treatment) The history is provided by the patient and the spouse.  78 y/o M with hx urinary retention and foley catheter placed in ED yesterday morning presents to Ed with c/c of foley catheter leakage. Unsure of whether urine is leaking around catheter or from bag, but has had urine run down his leg on multiple occasions. Urine is still draining into catheter bag. Denies abdominal pain or pressure or hematuria. No known aggravating or alleviating factors.   Past Medical History  Diagnosis Date  . Kidney disease   . Diabetes mellitus   . Hypertension   . MI (myocardial infarction)     1974  . Peripheral vascular disease     veinsi rerouted in both legs   . Pneumonia     hx of   . Anemia   . Blood transfusion     hx of 6 months ago   . GERD (gastroesophageal reflux disease)   . Arthritis     Past Surgical History  Procedure Date  . Back surgery     back surgery x 2   . Eye surgery     bilateral cataract surgery with implants   . Other surgical history     2 fatty tumors removed - on different arms   . Other surgical history     prostate surgery x 3   . Other surgical history     veins rerouted in both legs   . Other surgical history     hemorrhoid surgery  . Other surgical history     carpal tunnel right   . Other surgical history     big toe nails removed   . Other surgical history     repair of blood vessel in esophagus and stomach- 2006  . Other surgical history     bilateral kidney stents  2007  . Other surgical history     small bowel obstruction 2011   . Excision/release bursa hip 08/13/2011    Procedure: EXCISION/RELEASE BURSA HIP;  Surgeon: Jacki Cones, MD;  Location: [facility];  Service: Orthopedics;  Laterality: Right;  Right Hip Excision Tumor    History reviewed. No pertinent family history.  History  Substance Use Topics  . Smoking status: Former Smoker    Quit date: 07/22/1972  . Smokeless tobacco: Never Used  . Alcohol Use: No      Review of Systems  Constitutional: Negative for fever and chills.  Gastrointestinal: Negative for abdominal pain and abdominal distention.  Genitourinary:       See HPI    Allergies  Amoxicillin; Apresoline esidrix; and Clarithromycin  Home Medications   Current Outpatient Rx  Name Route Sig Dispense Refill  . ASPIRIN EC 81 MG PO TBEC Oral Take 81 mg by mouth daily before breakfast.     . CALCIUM CARBONATE-VITAMIN D 500-200 MG-UNIT PO TABS Oral Take 2 tablets by mouth daily.    Marland Kitchen CARVEDILOL 25 MG PO TABS Oral Take 25 mg by mouth 2 (two) times daily with a meal.    . DESOXIMETASONE 0.25 % EX CREA Topical Apply 1 application topically 2 (two) times daily as needed. For legs (itching)    . FERROUS SULFATE 325 (65 FE)  MG PO TABS Oral Take 325 mg by mouth daily with breakfast.    . FINASTERIDE 5 MG PO TABS Oral Take 5 mg by mouth daily before breakfast.     . FUROSEMIDE 40 MG PO TABS Oral Take 40 mg by mouth 2 (two) times daily.    Marland Kitchen LOSARTAN POTASSIUM 100 MG PO TABS Oral Take 100 mg by mouth daily before breakfast.     . MIRTAZAPINE 30 MG PO TABS Oral Take 30 mg by mouth at bedtime.    . ADULT MULTIVITAMIN W/MINERALS CH Oral Take 1 tablet by mouth daily before breakfast.     . OMEGA-3-ACID ETHYL ESTERS 1 G PO CAPS Oral Take 1 g by mouth 3 (three) times daily.    Marland Kitchen PRILOSEC PO Oral Take 15 mg by mouth daily.    . OXYCODONE-ACETAMINOPHEN 5-325 MG PO TABS Oral Take 1 tablet by mouth every 4 (four) hours as needed. Hip pain    . POLYETHYLENE GLYCOL 3350 PO PACK Oral Take 17 g by mouth daily before breakfast.     . ROSUVASTATIN CALCIUM 20 MG PO TABS Oral Take 20 mg by mouth at bedtime.     Marland Kitchen SITAGLIPTIN PHOSPHATE 50  MG PO TABS Oral Take 50 mg by mouth daily before breakfast.     . TAMSULOSIN HCL 0.4 MG PO CAPS Oral Take 0.4 mg by mouth daily after supper.     Marland Kitchen VITAMIN B-12 1000 MCG PO TABS Oral Take 1,000 mcg by mouth daily before breakfast.     . VITAMIN C 500 MG PO TABS Oral Take 500 mg by mouth daily before breakfast.     . ZALEPLON 5 MG PO CAPS Oral Take 5 mg by mouth at bedtime.      BP 144/76  Pulse 70  Temp(Src) 98.7 F (37.1 C) (Oral)  Resp 20  SpO2 97%  Physical Exam  Nursing note reviewed. Constitutional: He appears well-developed and well-nourished. No distress.       Vital signs are reviewed and are normal except for slight HTN   HENT:  Head: Normocephalic and atraumatic.  Neck: Neck supple.  Cardiovascular: Normal rate and regular rhythm.   Pulmonary/Chest: Effort normal. No respiratory distress.  Abdominal: Soft. Bowel sounds are normal. He exhibits no distension. There is no tenderness. There is no guarding.  Genitourinary: Penis normal. No penile tenderness.       Foley catheter in place, resistance of inflated balloon felt with tension on catheter. No fluid leakage seen around catheter. Leg bag appears appropriately attached with small amount of clear yellow urine in bag.  Neurological: He is alert.       MS appears baseline for pt  Skin: Skin is warm.  Psychiatric: He has a normal mood and affect.    ED Course  Procedures (including critical care time)  Labs Reviewed - No data to display No results found.   1. Foley catheter problem       MDM  ?leakage of catheter placed yesterday in ED. No leakage seen on exam. Foley draining well- nurse tech emptied >200cc urine prior to my assessment. Resistance on tension suggests balloon is still inflated. Discussed with pt options to insert larger catheter vs managing what sounds to be a small amount of leakage and he would prefer to leave current catheter in place. Will f/u with urologist as previously planned in the next  couple of days.         Shaaron Adler, [place] 11/06/11 1137

## 2011-11-08 ENCOUNTER — Encounter (HOSPITAL_COMMUNITY)
Admission: RE | Disposition: A | Discharge status: Home / Self Care | Payer: Self-pay | Source: Ambulatory Visit | Attending: Gastroenterology

## 2011-11-08 ENCOUNTER — Ambulatory Visit (HOSPITAL_COMMUNITY)
Admission: RE | Admit: 2011-11-08 | Discharge: 2011-11-08 | Disposition: A | Discharge status: Home / Self Care | Payer: Medicare Other | Source: Ambulatory Visit | Attending: Gastroenterology | Admitting: Gastroenterology

## 2011-11-08 DIAGNOSIS — R1310 Dysphagia, unspecified: Secondary | ICD-10-CM | POA: Insufficient documentation

## 2011-11-08 HISTORY — PX: ESOPHAGEAL MANOMETRY: SHX5429

## 2011-11-08 SURGERY — MANOMETRY, ESOPHAGUS
Anesthesia: Choice

## 2011-11-08 MED ORDER — LIDOCAINE VISCOUS 2 % MT SOLN
OROMUCOSAL | Status: AC
  Filled 2011-11-08: qty 15

## 2011-11-09 ENCOUNTER — Encounter (HOSPITAL_COMMUNITY): Payer: Self-pay | Admitting: Gastroenterology

## 2011-11-22 ENCOUNTER — Ambulatory Visit: Payer: Medicare Other | Attending: Gastroenterology

## 2011-11-22 DIAGNOSIS — R1310 Dysphagia, unspecified: Secondary | ICD-10-CM | POA: Insufficient documentation

## 2011-11-22 DIAGNOSIS — IMO0001 Reserved for inherently not codable concepts without codable children: Secondary | ICD-10-CM | POA: Insufficient documentation

## 2011-12-17 ENCOUNTER — Other Ambulatory Visit: Payer: Self-pay | Admitting: Dermatology

## 2011-12-19 ENCOUNTER — Emergency Department (HOSPITAL_COMMUNITY): Payer: Medicare Other

## 2011-12-19 ENCOUNTER — Emergency Department (HOSPITAL_COMMUNITY)
Admission: EM | Admit: 2011-12-19 | Discharge: 2011-12-19 | Disposition: A | Discharge status: Home / Self Care | Payer: Medicare Other | Attending: Emergency Medicine | Admitting: Emergency Medicine

## 2011-12-19 ENCOUNTER — Encounter (HOSPITAL_COMMUNITY): Payer: Self-pay | Admitting: *Deleted

## 2011-12-19 DIAGNOSIS — Z87891 Personal history of nicotine dependence: Secondary | ICD-10-CM | POA: Insufficient documentation

## 2011-12-19 DIAGNOSIS — I739 Peripheral vascular disease, unspecified: Secondary | ICD-10-CM | POA: Insufficient documentation

## 2011-12-19 DIAGNOSIS — I10 Essential (primary) hypertension: Secondary | ICD-10-CM | POA: Insufficient documentation

## 2011-12-19 DIAGNOSIS — K5900 Constipation, unspecified: Secondary | ICD-10-CM | POA: Insufficient documentation

## 2011-12-19 DIAGNOSIS — E119 Type 2 diabetes mellitus without complications: Secondary | ICD-10-CM | POA: Insufficient documentation

## 2011-12-19 DIAGNOSIS — K219 Gastro-esophageal reflux disease without esophagitis: Secondary | ICD-10-CM | POA: Insufficient documentation

## 2011-12-19 DIAGNOSIS — Z79899 Other long term (current) drug therapy: Secondary | ICD-10-CM | POA: Insufficient documentation

## 2011-12-19 DIAGNOSIS — Z7982 Long term (current) use of aspirin: Secondary | ICD-10-CM | POA: Insufficient documentation

## 2011-12-19 HISTORY — DX: Unspecified intestinal obstruction, unspecified as to partial versus complete obstruction: K56.609

## 2011-12-19 LAB — CBC WITH DIFFERENTIAL/PLATELET
Basophils Relative: 1 % (ref 0–1)
Eosinophils Absolute: 0.5 10*3/uL (ref 0.0–0.7)
Eosinophils Relative: 7 % — ABNORMAL HIGH (ref 0–5)
HCT: 31.8 % — ABNORMAL LOW (ref 39.0–52.0)
Hemoglobin: 10.5 g/dL — ABNORMAL LOW (ref 13.0–17.0)
Lymphs Abs: 1.5 10*3/uL (ref 0.7–4.0)
MCH: 27.4 pg (ref 26.0–34.0)
MCHC: 33 g/dL (ref 30.0–36.0)
MCV: 83 fL (ref 78.0–100.0)
Monocytes Absolute: 0.9 10*3/uL (ref 0.1–1.0)
Monocytes Relative: 12 % (ref 3–12)
Neutrophils Relative %: 59 % (ref 43–77)
RBC: 3.83 MIL/uL — ABNORMAL LOW (ref 4.22–5.81)

## 2011-12-19 LAB — BASIC METABOLIC PANEL
BUN: 28 mg/dL — ABNORMAL HIGH (ref 6–23)
Creatinine, Ser: 1.2 mg/dL (ref 0.50–1.35)
GFR calc non Af Amer: 52 mL/min — ABNORMAL LOW (ref >90)
Glucose, Bld: 114 mg/dL — ABNORMAL HIGH (ref 70–99)
Potassium: 3.9 mEq/L (ref 3.5–5.1)

## 2011-12-19 MED ORDER — IOHEXOL 300 MG/ML  SOLN
80.0000 mL | Freq: Once | INTRAMUSCULAR | Status: AC | PRN
  Administered 2011-12-19: 80 mL via INTRAVENOUS

## 2011-12-19 NOTE — ED Notes (Signed)
Pt states he is having trouble having a bowel movement, takes laxatives daily and enemas after suppository  With poor results,

## 2011-12-19 NOTE — Discharge Instructions (Signed)
You can use the Miralax twice a day until your bowel move on a more regular basis

## 2011-12-19 NOTE — ED Provider Notes (Signed)
Medical screening examination/treatment/procedure(s) were conducted as a shared visit with non-physician practitioner(s) and myself.  I personally evaluated the patient during the encounter  Pt complains of abdominal pain, constipation and bloating.  Initial labs and x-rays without significant abnormalities.  With his prior  history and age will ct abdomen for further evalaution  Celene Kras, MD 12/19/11 2005

## 2011-12-19 NOTE — ED Notes (Addendum)
Patient transported to X-ray 

## 2011-12-19 NOTE — ED Notes (Signed)
C/o constipation ==had an enema and suppository today without success.

## 2011-12-19 NOTE — ED Notes (Signed)
Pt returned from xray

## 2011-12-19 NOTE — ED Provider Notes (Signed)
History     CSN: [id number]  Arrival date & time 12/19/11  1438   First MD Initiated Contact with Patient 12/19/11 1650      Chief Complaint  Patient presents with  . Constipation    (Consider location/radiation/quality/duration/timing/severity/associated sxs/prior treatment) HPI  78 year old male with history of bowel obstruction, history of diabetes, and history of chronic pain taking Percocet presents complaining of constipation. Patient states for the past week he has been having increased difficulty having bowel movement. States he has the urge but he can only pass flatus. Last bowel movement was this morning with minimal result. Denies fever, chills, nausea, vomiting, abdominal pain. Patient has tried using 2 stool softeners, Dulcolax, and Senokot for the past several weeks without relief. He does take Percocet for pain he has been taken for the past year. He has no prior abdominal surgery but it had a history of small bowel obstruction 2 years ago.  Past Medical History  Diagnosis Date  . Kidney disease   . Diabetes mellitus   . Hypertension   . MI (myocardial infarction)     1974  . Peripheral vascular disease     veinsi rerouted in both legs   . Pneumonia     hx of   . Anemia   . Blood transfusion     hx of 6 months ago   . GERD (gastroesophageal reflux disease)   . Arthritis   . Bowel obstruction     Past Surgical History  Procedure Date  . Back surgery     back surgery x 2   . Eye surgery     bilateral cataract surgery with implants   . Other surgical history     2 fatty tumors removed - on different arms   . Other surgical history     prostate surgery x 3   . Other surgical history     veins rerouted in both legs   . Other surgical history     hemorrhoid surgery  . Other surgical history     carpal tunnel right   . Other surgical history     big toe nails removed   . Other surgical history     repair of blood vessel in esophagus and stomach- 2006    . Other surgical history     bilateral kidney stents  2007  . Other surgical history     small bowel obstruction 2011   . Excision/release bursa hip 08/13/2011    Procedure: EXCISION/RELEASE BURSA HIP;  Surgeon: Jacki Cones, MD;  Location: [facility];  Service: Orthopedics;  Laterality: Right;  Right Hip Excision Tumor  . Esophageal manometry 11/08/2011    Procedure: ESOPHAGEAL MANOMETRY (EM);  Surgeon: Theda Belfast, MD;  Location: [facility];  Service: Endoscopy;  Laterality: N/A;    No family history on file.  History  Substance Use Topics  . Smoking status: Former Smoker    Quit date: 07/22/1972  . Smokeless tobacco: Never Used  . Alcohol Use: No      Review of Systems  All other systems reviewed and are negative.    Allergies  Amoxicillin; Apresoline esidrix; and Clarithromycin  Home Medications   Current Outpatient Rx  Name Route Sig Dispense Refill  . ASPIRIN EC 81 MG PO TBEC Oral Take 81 mg by mouth daily before breakfast.     . CALCIUM CARBONATE-VITAMIN D 500-200 MG-UNIT PO TABS Oral Take 2 tablets by mouth daily.    Marland Kitchen CARVEDILOL 25  MG PO TABS Oral Take 25 mg by mouth 2 (two) times daily with a meal.    . DESOXIMETASONE 0.25 % EX CREA Topical Apply 1 application topically 2 (two) times daily as needed. For legs (itching)    . FERROUS SULFATE 325 (65 FE) MG PO TABS Oral Take 325 mg by mouth daily with breakfast.    . FINASTERIDE 5 MG PO TABS Oral Take 5 mg by mouth daily before breakfast.     . FUROSEMIDE 40 MG PO TABS Oral Take 40 mg by mouth 2 (two) times daily.    Marland Kitchen LOSARTAN POTASSIUM 100 MG PO TABS Oral Take 100 mg by mouth daily before breakfast.     . MIRTAZAPINE 30 MG PO TABS Oral Take 30 mg by mouth at bedtime.    . ADULT MULTIVITAMIN W/MINERALS CH Oral Take 1 tablet by mouth daily before breakfast.     . OMEGA-3-ACID ETHYL ESTERS 1 G PO CAPS Oral Take 1 g by mouth 3 (three) times daily.    Marland Kitchen PRILOSEC PO Oral Take 15 mg by mouth daily.    .  OXYCODONE-ACETAMINOPHEN 5-325 MG PO TABS Oral Take 1 tablet by mouth every 4 (four) hours as needed. Hip pain    . POLYETHYLENE GLYCOL 3350 PO PACK Oral Take 17 g by mouth daily before breakfast.     . ROSUVASTATIN CALCIUM 20 MG PO TABS Oral Take 20 mg by mouth at bedtime.     Marland Kitchen SITAGLIPTIN PHOSPHATE 50 MG PO TABS Oral Take 50 mg by mouth daily before breakfast.     . TAMSULOSIN HCL 0.4 MG PO CAPS Oral Take 0.4 mg by mouth daily after supper.     Marland Kitchen VITAMIN B-12 1000 MCG PO TABS Oral Take 1,000 mcg by mouth daily before breakfast.     . VITAMIN C 500 MG PO TABS Oral Take 500 mg by mouth daily before breakfast.     . ZALEPLON 5 MG PO CAPS Oral Take 5 mg by mouth at bedtime.      BP 183/66  Pulse 68  Temp 98.4 F (36.9 C) (Oral)  Resp 18  SpO2 99%  Physical Exam  Nursing note and vitals reviewed. Constitutional: He appears well-developed and well-nourished. No distress.  HENT:  Head: Atraumatic.  Mouth/Throat: Oropharynx is clear and moist.  Eyes: Conjunctivae are normal.  Neck: Normal range of motion. Neck supple.  Cardiovascular: Normal rate and regular rhythm.   Pulmonary/Chest: Effort normal. No respiratory distress. He exhibits no tenderness.  Abdominal: Soft. There is no tenderness. There is no rigidity, no guarding, no CVA tenderness, no tenderness at McBurney's point and negative Murphy's sign. No hernia.  Genitourinary: Rectum normal. Rectal exam shows no mass, no tenderness and anal tone normal.  Musculoskeletal: He exhibits no edema.  Neurological: He is alert.  Skin: Skin is warm. No rash noted.    ED Course  Procedures (including critical care time)  Labs Reviewed - No data to display No results found.  Results for orders placed during the hospital encounter of 12/19/11  CBC WITH DIFFERENTIAL      Component Value Range   WBC 7.1  4.0 - 10.5 K/uL   RBC 3.83 (*) 4.22 - 5.81 MIL/uL   Hemoglobin 10.5 (*) 13.0 - 17.0 g/dL   HCT 16.1 (*) 09.6 - 04.5 %   MCV 83.0   78.0 - 100.0 fL   MCH 27.4  26.0 - 34.0 pg   MCHC 33.0  30.0 - 36.0 g/dL  RDW 15.4  11.5 - 15.5 %   Platelets 139 (*) 150 - 400 K/uL   Neutrophils Relative 59  43 - 77 %   Neutro Abs 4.2  1.7 - 7.7 K/uL   Lymphocytes Relative 22  12 - 46 %   Lymphs Abs 1.5  0.7 - 4.0 K/uL   Monocytes Relative 12  3 - 12 %   Monocytes Absolute 0.9  0.1 - 1.0 K/uL   Eosinophils Relative 7 (*) 0 - 5 %   Eosinophils Absolute 0.5  0.0 - 0.7 K/uL   Basophils Relative 1  0 - 1 %   Basophils Absolute 0.0  0.0 - 0.1 K/uL  BASIC METABOLIC PANEL      Component Value Range   Sodium 137  135 - 145 mEq/L   Potassium 3.9  3.5 - 5.1 mEq/L   Chloride 98  96 - 112 mEq/L   CO2 27  19 - 32 mEq/L   Glucose, Bld 114 (*) 70 - 99 mg/dL   BUN 28 (*) 6 - 23 mg/dL   Creatinine, Ser 1.61  0.50 - 1.35 mg/dL   Calcium 8.9  8.4 - 09.6 mg/dL   GFR calc non Af Amer 52 (*) >90 mL/min   GFR calc Af Amer 60 (*) >90 mL/min  OCCULT BLOOD, POC DEVICE      Component Value Range   Fecal Occult Bld NEGATIVE     Dg Abd Acute W/chest  12/19/2011  *RADIOLOGY REPORT*  Clinical Data: Constipation  ACUTE ABDOMEN SERIES (ABDOMEN 2 VIEW & CHEST 1 VIEW)  Comparison: 08/09/2011  Findings: Similar appearance of right apical scar.  There are coarsened interstitial markings identified bilaterally, similar to previous exam.  No airspace consolidation.  There is gas and stool noted throughout the colon up to the level of the rectum.  A few central air-filled loops of nondilated small bowel are noted.  Stents are noted in bilateral renal arteries.  IMPRESSION:  1.  Nonobstructive bowel gas pattern.  Original Report Authenticated By: Rosealee Albee, M.D.     Time spent personally by me on the following activities: Review prior charts, imaging, and labs.  Development of treatment plan with patient and/or surrogate as well as nursing, discussions with consultants, evaluation of patient's response to treatment, examination of patient, obtaining history  from patient or surrogate, ordering and performing treatments and interventions, ordering and review of laboratory studies, ordering and review of radiographic studies, pulse oximetry and re-evaluation of patient's condition.   MDM  Constipation, not improving with stool softener at home.  LBM today, able to pass flatus.  Will perform rectal exam, check basic labs.  5:38 PM Abdomen is nontender on exam. Rectal exam reveals soft stool no evidence of stool impaction.  Acute abd xray reviewed by me reveals nonobstructive bowel gas pattern.    8:04 PM Abd/pelvic CT ordered for further evaluation.  If negative, pt to f/u with Dr. Renne Crigler for further evaluation of his constipation.  Report given to Sabino Dick, NP at shift change.       Fayrene Helper, PA-C 12/19/11 2004

## 2011-12-19 NOTE — ED Provider Notes (Signed)
  Physical Exam  BP 190/86  Pulse 65  Temp 98.4 F (36.9 C) (Oral)  Resp 16  SpO2 98%  Physical Exam Patient in no distress awaiting CT Scan results  ED Course  Procedures  MDM CT Scan results discussed with Dr. Valda Favia, NP 12/19/11 2201

## 2011-12-19 NOTE — ED Notes (Signed)
Pt requesting IV removal

## 2012-03-09 ENCOUNTER — Other Ambulatory Visit: Payer: Self-pay | Admitting: Internal Medicine

## 2012-03-09 DIAGNOSIS — R911 Solitary pulmonary nodule: Secondary | ICD-10-CM

## 2012-03-14 ENCOUNTER — Other Ambulatory Visit: Payer: Medicare Other

## 2012-03-15 ENCOUNTER — Ambulatory Visit
Admission: RE | Admit: 2012-03-15 | Discharge: 2012-03-15 | Disposition: A | Discharge status: Home / Self Care | Payer: Medicare Other | Source: Ambulatory Visit | Attending: Internal Medicine | Admitting: Internal Medicine

## 2012-03-15 DIAGNOSIS — R911 Solitary pulmonary nodule: Secondary | ICD-10-CM

## 2012-05-11 ENCOUNTER — Encounter: Payer: Self-pay | Admitting: Vascular Surgery

## 2012-05-23 ENCOUNTER — Encounter: Payer: Self-pay | Admitting: Neurosurgery

## 2012-05-24 ENCOUNTER — Ambulatory Visit: Payer: Medicare Other | Admitting: Neurosurgery

## 2012-05-24 ENCOUNTER — Other Ambulatory Visit: Payer: Self-pay | Admitting: *Deleted

## 2012-05-24 ENCOUNTER — Emergency Department (HOSPITAL_COMMUNITY): Payer: Medicare Other

## 2012-05-24 ENCOUNTER — Encounter (HOSPITAL_COMMUNITY): Payer: Self-pay | Admitting: Emergency Medicine

## 2012-05-24 ENCOUNTER — Inpatient Hospital Stay (HOSPITAL_COMMUNITY)
Admission: EM | Admit: 2012-05-24 | Discharge: 2012-05-29 | DRG: 194 | Disposition: A | Discharge status: Skilled Nursing Facility | Payer: Medicare Other | Attending: Internal Medicine | Admitting: Internal Medicine

## 2012-05-24 DIAGNOSIS — M7071 Other bursitis of hip, right hip: Secondary | ICD-10-CM

## 2012-05-24 DIAGNOSIS — Z87891 Personal history of nicotine dependence: Secondary | ICD-10-CM

## 2012-05-24 DIAGNOSIS — D72829 Elevated white blood cell count, unspecified: Secondary | ICD-10-CM

## 2012-05-24 DIAGNOSIS — Z48812 Encounter for surgical aftercare following surgery on the circulatory system: Secondary | ICD-10-CM

## 2012-05-24 DIAGNOSIS — E119 Type 2 diabetes mellitus without complications: Secondary | ICD-10-CM | POA: Diagnosis present

## 2012-05-24 DIAGNOSIS — I10 Essential (primary) hypertension: Secondary | ICD-10-CM | POA: Diagnosis present

## 2012-05-24 DIAGNOSIS — N189 Chronic kidney disease, unspecified: Secondary | ICD-10-CM

## 2012-05-24 DIAGNOSIS — I739 Peripheral vascular disease, unspecified: Secondary | ICD-10-CM

## 2012-05-24 DIAGNOSIS — Z7982 Long term (current) use of aspirin: Secondary | ICD-10-CM

## 2012-05-24 DIAGNOSIS — I252 Old myocardial infarction: Secondary | ICD-10-CM

## 2012-05-24 DIAGNOSIS — K219 Gastro-esophageal reflux disease without esophagitis: Secondary | ICD-10-CM | POA: Diagnosis present

## 2012-05-24 DIAGNOSIS — I509 Heart failure, unspecified: Secondary | ICD-10-CM

## 2012-05-24 DIAGNOSIS — R339 Retention of urine, unspecified: Secondary | ICD-10-CM | POA: Diagnosis present

## 2012-05-24 DIAGNOSIS — Z79899 Other long term (current) drug therapy: Secondary | ICD-10-CM

## 2012-05-24 DIAGNOSIS — D649 Anemia, unspecified: Secondary | ICD-10-CM

## 2012-05-24 DIAGNOSIS — E871 Hypo-osmolality and hyponatremia: Secondary | ICD-10-CM

## 2012-05-24 DIAGNOSIS — I2510 Atherosclerotic heart disease of native coronary artery without angina pectoris: Secondary | ICD-10-CM | POA: Diagnosis present

## 2012-05-24 DIAGNOSIS — N183 Chronic kidney disease, stage 3 unspecified: Secondary | ICD-10-CM | POA: Diagnosis present

## 2012-05-24 DIAGNOSIS — D638 Anemia in other chronic diseases classified elsewhere: Secondary | ICD-10-CM | POA: Diagnosis present

## 2012-05-24 DIAGNOSIS — J189 Pneumonia, unspecified organism: Principal | ICD-10-CM | POA: Diagnosis present

## 2012-05-24 LAB — CBC WITH DIFFERENTIAL/PLATELET
HCT: 30.1 % — ABNORMAL LOW (ref 39.0–52.0)
Hemoglobin: 10.4 g/dL — ABNORMAL LOW (ref 13.0–17.0)
Lymphs Abs: 0.7 10*3/uL (ref 0.7–4.0)
Monocytes Relative: 10 % (ref 3–12)
Neutro Abs: 19.3 10*3/uL — ABNORMAL HIGH (ref 1.7–7.7)
Neutrophils Relative %: 87 % — ABNORMAL HIGH (ref 43–77)
RBC: 3.63 MIL/uL — ABNORMAL LOW (ref 4.22–5.81)

## 2012-05-24 LAB — URINALYSIS, MICROSCOPIC ONLY
Bilirubin Urine: NEGATIVE
Glucose, UA: NEGATIVE mg/dL
Hgb urine dipstick: NEGATIVE
Protein, ur: NEGATIVE mg/dL

## 2012-05-24 LAB — BASIC METABOLIC PANEL
BUN: 44 mg/dL — ABNORMAL HIGH (ref 6–23)
CO2: 27 mEq/L (ref 19–32)
Chloride: 92 mEq/L — ABNORMAL LOW (ref 96–112)
Glucose, Bld: 134 mg/dL — ABNORMAL HIGH (ref 70–99)
Potassium: 3.9 mEq/L (ref 3.5–5.1)

## 2012-05-24 LAB — GLUCOSE, CAPILLARY: Glucose-Capillary: 115 mg/dL — ABNORMAL HIGH (ref 70–99)

## 2012-05-24 LAB — POCT I-STAT TROPONIN I: Troponin i, poc: 0.08 ng/mL (ref 0.00–0.08)

## 2012-05-24 MED ORDER — MORPHINE SULFATE 2 MG/ML IJ SOLN
2.0000 mg | INTRAMUSCULAR | Status: DC | PRN
Start: 2012-05-24 — End: 2012-05-29

## 2012-05-24 MED ORDER — SODIUM CHLORIDE 0.9 % IV SOLN: 250.0000 mL | INTRAVENOUS | Status: DC | PRN

## 2012-05-24 MED ORDER — SODIUM CHLORIDE 0.9 % IJ SOLN
3.0000 mL | Freq: Two times a day (BID) | INTRAMUSCULAR | Status: DC
  Administered 2012-05-25 – 2012-05-27 (×3): 3 mL via INTRAVENOUS

## 2012-05-24 MED ORDER — LEVOFLOXACIN IN D5W 250 MG/50ML IV SOLN
250.0000 mg | INTRAVENOUS | Status: DC
  Administered 2012-05-25 – 2012-05-28 (×4): 250 mg via INTRAVENOUS
  Filled 2012-05-24 (×5): qty 50

## 2012-05-24 MED ORDER — PANTOPRAZOLE SODIUM 40 MG PO TBEC
40.0000 mg | DELAYED_RELEASE_TABLET | Freq: Every day | ORAL | Status: DC
  Administered 2012-05-24 – 2012-05-29 (×6): 40 mg via ORAL
  Filled 2012-05-24 (×6): qty 1

## 2012-05-24 MED ORDER — ONDANSETRON HCL 4 MG/2ML IJ SOLN
4.0000 mg | Freq: Four times a day (QID) | INTRAMUSCULAR | Status: DC | PRN
  Administered 2012-05-27: 4 mg via INTRAVENOUS
  Filled 2012-05-24: qty 2

## 2012-05-24 MED ORDER — FENTANYL CITRATE 0.05 MG/ML IJ SOLN
50.0000 ug | Freq: Once | INTRAMUSCULAR | Status: AC
  Administered 2012-05-24: 50 ug via INTRAVENOUS
  Filled 2012-05-24: qty 2

## 2012-05-24 MED ORDER — IMIPENEM-CILASTATIN 250 MG IV SOLR
250.0000 mg | Freq: Once | INTRAVENOUS | Status: AC
  Administered 2012-05-24: 250 mg via INTRAVENOUS
  Filled 2012-05-24: qty 250

## 2012-05-24 MED ORDER — ALBUTEROL SULFATE (5 MG/ML) 0.5% IN NEBU: 2.5000 mg | INHALATION_SOLUTION | RESPIRATORY_TRACT | Status: DC | PRN

## 2012-05-24 MED ORDER — ASPIRIN EC 81 MG PO TBEC
81.0000 mg | DELAYED_RELEASE_TABLET | Freq: Every day | ORAL | Status: DC
  Administered 2012-05-25 – 2012-05-29 (×5): 81 mg via ORAL
  Filled 2012-05-24 (×6): qty 1

## 2012-05-24 MED ORDER — SODIUM CHLORIDE 0.9 % IJ SOLN
3.0000 mL | Freq: Two times a day (BID) | INTRAMUSCULAR | Status: DC
  Administered 2012-05-27: 3 mL via INTRAVENOUS

## 2012-05-24 MED ORDER — ACETAMINOPHEN 325 MG PO TABS: 650.0000 mg | ORAL_TABLET | Freq: Four times a day (QID) | ORAL | Status: DC | PRN

## 2012-05-24 MED ORDER — IPRATROPIUM BROMIDE 0.02 % IN SOLN: 0.5000 mg | RESPIRATORY_TRACT | Status: DC | PRN

## 2012-05-24 MED ORDER — ONDANSETRON HCL 4 MG PO TABS: 4.0000 mg | ORAL_TABLET | Freq: Four times a day (QID) | ORAL | Status: DC | PRN

## 2012-05-24 MED ORDER — CARVEDILOL 25 MG PO TABS
25.0000 mg | ORAL_TABLET | Freq: Two times a day (BID) | ORAL | Status: DC
  Administered 2012-05-24 – 2012-05-29 (×10): 25 mg via ORAL
  Filled 2012-05-24 (×12): qty 1

## 2012-05-24 MED ORDER — VITAMIN C 500 MG PO TABS
500.0000 mg | ORAL_TABLET | Freq: Every day | ORAL | Status: DC
  Administered 2012-05-25 – 2012-05-29 (×5): 500 mg via ORAL
  Filled 2012-05-24 (×6): qty 1

## 2012-05-24 MED ORDER — LOSARTAN POTASSIUM 50 MG PO TABS
100.0000 mg | ORAL_TABLET | Freq: Every day | ORAL | Status: DC
  Administered 2012-05-25 – 2012-05-29 (×5): 100 mg via ORAL
  Filled 2012-05-24 (×6): qty 2

## 2012-05-24 MED ORDER — FUROSEMIDE 40 MG PO TABS
40.0000 mg | ORAL_TABLET | Freq: Two times a day (BID) | ORAL | Status: DC
  Administered 2012-05-24 – 2012-05-29 (×10): 40 mg via ORAL
  Filled 2012-05-24 (×12): qty 1

## 2012-05-24 MED ORDER — SODIUM CHLORIDE 0.9 % IJ SOLN: 3.0000 mL | INTRAMUSCULAR | Status: DC | PRN

## 2012-05-24 MED ORDER — ZOLPIDEM TARTRATE 5 MG PO TABS
5.0000 mg | ORAL_TABLET | Freq: Every evening | ORAL | Status: DC | PRN
  Administered 2012-05-25 – 2012-05-28 (×4): 5 mg via ORAL
  Filled 2012-05-24 (×4): qty 1

## 2012-05-24 MED ORDER — MIRTAZAPINE 30 MG PO TABS
30.0000 mg | ORAL_TABLET | Freq: Every day | ORAL | Status: DC
  Administered 2012-05-24 – 2012-05-28 (×5): 30 mg via ORAL
  Filled 2012-05-24 (×6): qty 1

## 2012-05-24 MED ORDER — DOXAZOSIN MESYLATE 4 MG PO TABS
4.0000 mg | ORAL_TABLET | Freq: Every day | ORAL | Status: DC
  Administered 2012-05-24 – 2012-05-28 (×5): 4 mg via ORAL
  Filled 2012-05-24 (×6): qty 1

## 2012-05-24 MED ORDER — DESOXIMETASONE 0.25 % EX CREA: 1.0000 "application " | TOPICAL_CREAM | Freq: Two times a day (BID) | CUTANEOUS | Status: DC | PRN

## 2012-05-24 MED ORDER — ADULT MULTIVITAMIN W/MINERALS CH
1.0000 | ORAL_TABLET | Freq: Every day | ORAL | Status: DC
  Administered 2012-05-25 – 2012-05-29 (×5): 1 via ORAL
  Filled 2012-05-24 (×6): qty 1

## 2012-05-24 MED ORDER — ACETAMINOPHEN 650 MG RE SUPP: 650.0000 mg | Freq: Four times a day (QID) | RECTAL | Status: DC | PRN

## 2012-05-24 MED ORDER — INSULIN ASPART 100 UNIT/ML ~~LOC~~ SOLN: 0.0000 [IU] | Freq: Every day | SUBCUTANEOUS | Status: DC

## 2012-05-24 MED ORDER — LEVOFLOXACIN IN D5W 500 MG/100ML IV SOLN: 500.0000 mg | INTRAVENOUS | Status: DC

## 2012-05-24 MED ORDER — POLYETHYLENE GLYCOL 3350 17 G PO PACK
17.0000 g | PACK | Freq: Every day | ORAL | Status: DC
  Administered 2012-05-25 – 2012-05-29 (×5): 17 g via ORAL
  Filled 2012-05-24 (×8): qty 1

## 2012-05-24 MED ORDER — SODIUM CHLORIDE 0.9 % IV SOLN
250.0000 mg | Freq: Four times a day (QID) | INTRAVENOUS | Status: DC
  Administered 2012-05-24 – 2012-05-25 (×4): 250 mg via INTRAVENOUS
  Filled 2012-05-24 (×6): qty 250

## 2012-05-24 MED ORDER — ATORVASTATIN CALCIUM 40 MG PO TABS
40.0000 mg | ORAL_TABLET | Freq: Every day | ORAL | Status: DC
  Administered 2012-05-24 – 2012-05-28 (×5): 40 mg via ORAL
  Filled 2012-05-24 (×6): qty 1

## 2012-05-24 MED ORDER — IPRATROPIUM BROMIDE 0.02 % IN SOLN
0.5000 mg | Freq: Four times a day (QID) | RESPIRATORY_TRACT | Status: DC
  Administered 2012-05-24 – 2012-05-25 (×4): 0.5 mg via RESPIRATORY_TRACT
  Filled 2012-05-24 (×4): qty 2.5

## 2012-05-24 MED ORDER — VITAMIN B-12 1000 MCG PO TABS
1000.0000 ug | ORAL_TABLET | Freq: Every day | ORAL | Status: DC
  Administered 2012-05-25 – 2012-05-29 (×5): 1000 ug via ORAL
  Filled 2012-05-24 (×6): qty 1

## 2012-05-24 MED ORDER — FINASTERIDE 5 MG PO TABS
5.0000 mg | ORAL_TABLET | Freq: Every day | ORAL | Status: DC
  Administered 2012-05-25 – 2012-05-29 (×5): 5 mg via ORAL
  Filled 2012-05-24 (×6): qty 1

## 2012-05-24 MED ORDER — OXYCODONE-ACETAMINOPHEN 5-325 MG PO TABS
1.0000 | ORAL_TABLET | ORAL | Status: DC | PRN
  Administered 2012-05-24 – 2012-05-29 (×14): 1 via ORAL
  Filled 2012-05-24 (×14): qty 1

## 2012-05-24 MED ORDER — HEPARIN SODIUM (PORCINE) 5000 UNIT/ML IJ SOLN
5000.0000 [IU] | Freq: Three times a day (TID) | INTRAMUSCULAR | Status: DC
  Administered 2012-05-24 – 2012-05-29 (×16): 5000 [IU] via SUBCUTANEOUS
  Filled 2012-05-24 (×18): qty 1

## 2012-05-24 MED ORDER — FLUOCINONIDE 0.05 % EX CREA
TOPICAL_CREAM | Freq: Every day | CUTANEOUS | Status: DC | PRN
  Filled 2012-05-24: qty 30

## 2012-05-24 MED ORDER — FERROUS SULFATE 325 (65 FE) MG PO TABS
325.0000 mg | ORAL_TABLET | Freq: Every day | ORAL | Status: DC
  Administered 2012-05-25 – 2012-05-29 (×5): 325 mg via ORAL
  Filled 2012-05-24 (×6): qty 1

## 2012-05-24 MED ORDER — INSULIN ASPART 100 UNIT/ML ~~LOC~~ SOLN
0.0000 [IU] | Freq: Three times a day (TID) | SUBCUTANEOUS | Status: DC
  Administered 2012-05-24 – 2012-05-26 (×3): 1 [IU] via SUBCUTANEOUS
  Administered 2012-05-26: 2 [IU] via SUBCUTANEOUS
  Administered 2012-05-27: 1 [IU] via SUBCUTANEOUS

## 2012-05-24 MED ORDER — LEVOFLOXACIN IN D5W 500 MG/100ML IV SOLN
500.0000 mg | Freq: Once | INTRAVENOUS | Status: AC
  Administered 2012-05-24: 500 mg via INTRAVENOUS
  Filled 2012-05-24: qty 100

## 2012-05-24 MED ORDER — CALCIUM CARBONATE-VITAMIN D 500-200 MG-UNIT PO TABS
2.0000 | ORAL_TABLET | Freq: Every day | ORAL | Status: DC
  Administered 2012-05-25 – 2012-05-29 (×5): 2 via ORAL
  Filled 2012-05-24 (×5): qty 2

## 2012-05-24 MED ORDER — ALBUTEROL SULFATE (5 MG/ML) 0.5% IN NEBU
2.5000 mg | INHALATION_SOLUTION | Freq: Four times a day (QID) | RESPIRATORY_TRACT | Status: DC
  Administered 2012-05-24 – 2012-05-25 (×4): 2.5 mg via RESPIRATORY_TRACT
  Filled 2012-05-24 (×4): qty 0.5

## 2012-05-24 NOTE — H&P (Signed)
PCP:   Londell Moh, MD   Chief Complaint:  Generalized weakness.   HPI: This is an 78 year old male, with known history of DM-2, HTN, CAD, s/p MI 1974, CHF, PVD, dyslipidemia, GERD, OA, s/p back surgery x 2, s/p right carpal tunnel surgery, s/p hemorrhoidectomy, BPH/obstructive uropathy, s/p prostate surgery, PVD, s/p revascularization, bilateral LE, s/p bilateral renal artery stents, CKD. According to patient and spouse, who accompanied him to ED, patient has had a productive cough with yellowish/brownish phlegm in the pas 3-4 weeks, which appeared some what improved with OTC Mucinex. Today, he was scheduled to see Dr Tawanna Cooler early, vascular surgeon, on a routine visit, but was so weak that he was unable to get out of bed. He spouse called EMS. Patient denies chest pain or SOB, denies fever or chills, denies recent sick contacts.  Patient has had trouble with dysphagia in the past, and is on a pureed diet.    Allergies:   Allergies  Allergen Reactions  . Amoxicillin Swelling  . Apresoline Esidrix (Hydralazine-Hctz) Itching  . Clarithromycin Itching      Past Medical History  Diagnosis Date  . Kidney disease   . Diabetes mellitus   . Hypertension   . MI (myocardial infarction)     1974  . Peripheral vascular disease     veinsi rerouted in both legs   . Pneumonia     hx of   . Anemia   . Blood transfusion     hx of 6 months ago   . GERD (gastroesophageal reflux disease)   . Arthritis   . Bowel obstruction   . PONV (postoperative nausea and vomiting)     Past Surgical History  Procedure Date  . Back surgery     back surgery x 2   . Eye surgery     bilateral cataract surgery with implants   . Other surgical history     2 fatty tumors removed - on different arms   . Other surgical history     prostate surgery x 3   . Other surgical history     veins rerouted in both legs   . Other surgical history     hemorrhoid surgery  . Other surgical history     carpal  tunnel right   . Other surgical history     big toe nails removed   . Other surgical history     repair of blood vessel in esophagus and stomach- 2006  . Other surgical history     bilateral kidney stents  2007  . Other surgical history     small bowel obstruction 2011   . Excision/release bursa hip 08/13/2011    Procedure: EXCISION/RELEASE BURSA HIP;  Surgeon: Jacki Cones, MD;  Location: [facility];  Service: Orthopedics;  Laterality: Right;  Right Hip Excision Tumor  . Esophageal manometry 11/08/2011    Procedure: ESOPHAGEAL MANOMETRY (EM);  Surgeon: Theda Belfast, MD;  Location: [facility];  Service: Endoscopy;  Laterality: N/A;    Prior to Admission medications   Medication Sig Start Date End Date Taking? Authorizing Provider  aspirin EC 81 MG tablet Take 81 mg by mouth daily before breakfast.    Yes Historical Provider, MD  calcium-vitamin D (OSCAL WITH D) 500-200 MG-UNIT per tablet Take 2 tablets by mouth daily.   Yes Historical Provider, MD  carvedilol (COREG) 25 MG tablet Take 25 mg by mouth 2 (two) times daily with a meal.   Yes Historical Provider,  MD  desoximetasone (TOPICORT) 0.25 % cream Apply 1 application topically 2 (two) times daily as needed. For legs (itching)   Yes Historical Provider, MD  doxazosin (CARDURA) 4 MG tablet Take 4 mg by mouth at bedtime.   Yes Historical Provider, MD  ferrous sulfate 325 (65 FE) MG tablet Take 325 mg by mouth daily with breakfast.   Yes Historical Provider, MD  furosemide (LASIX) 40 MG tablet Take 40 mg by mouth 2 (two) times daily.   Yes Historical Provider, MD  losartan (COZAAR) 100 MG tablet Take 100 mg by mouth daily before breakfast.    Yes Historical Provider, MD  mirtazapine (REMERON) 30 MG tablet Take 30 mg by mouth at bedtime.   Yes Historical Provider, MD  Multiple Vitamin (MULITIVITAMIN WITH MINERALS) TABS Take 1 tablet by mouth daily before breakfast.    Yes Historical Provider, MD  Omeprazole (PRILOSEC PO) Take 15 mg by  mouth daily.   Yes Historical Provider, MD  oxyCODONE-acetaminophen (PERCOCET) 5-325 MG per tablet Take 1 tablet by mouth every 4 (four) hours as needed. Hip pain   Yes Historical Provider, MD  polyethylene glycol (MIRALAX / GLYCOLAX) packet Take 17 g by mouth daily before breakfast.    Yes Historical Provider, MD  rosuvastatin (CRESTOR) 20 MG tablet Take 20 mg by mouth at bedtime.    Yes Historical Provider, MD  sitaGLIPtin (JANUVIA) 50 MG tablet Take 50 mg by mouth daily before breakfast.    Yes Historical Provider, MD  vitamin B-12 (CYANOCOBALAMIN) 1000 MCG tablet Take 1,000 mcg by mouth daily before breakfast.    Yes Historical Provider, MD  vitamin C (ASCORBIC ACID) 500 MG tablet Take 500 mg by mouth daily before breakfast.    Yes Historical Provider, MD  zaleplon (SONATA) 5 MG capsule Take 5 mg by mouth at bedtime.   Yes Historical Provider, MD  finasteride (PROSCAR) 5 MG tablet Take 5 mg by mouth daily before breakfast.     Historical Provider, MD    Social History: Patientreports that he quit smoking about 39 years ago. He has never used smokeless tobacco. He reports that he does not drink alcohol or use illicit drugs.  Family History: Father died at age 21, from heart problems, mother died at age 19, from a brain aneurysm.   Review of Systems:  As per HPI and chief complaint. Patent denies fatigue, diminished appetite, weight loss, fever, chills, headache, blurred vision, difficulty in speaking, dysphagia, chest pain, shortness of breath, orthopnea, paroxysmal nocturnal dyspnea, nausea, diaphoresis, abdominal pain, vomiting, diarrhea, belching, heartburn, hematemesis, melena, dysuria, nocturia, urinary frequency, hematochezia, lower extremity swelling, pain, or redness. The rest of the systems review is negative.  Physical Exam:  General:  Patient does not appear to be in obvious acute distress. Alert, communicative, fully oriented, talking in complete sentences, not short of breath at  rest.  HEENT:  Mild clinical pallor, no jaundice, no conjunctival injection or discharge. NECK:  Supple, JVP not seen, no carotid bruits, no palpable lymphadenopathy, no palpable goiter. CHEST:  Clinically clear to auscultation, no wheezes, no crackles. HEART:  Sounds 1 and 2 heard, normal, regular, no murmurs. ABDOMEN:  Full, soft, non-tender, no palpable organomegaly, no palpable masses, normal bowel sounds. GENITALIA:  Not examined. LOWER EXTREMITIES:  Minimal pitting edema, palpable peripheral pulses. MUSCULOSKELETAL SYSTEM:  Generalized osteoarthritic changes, otherwise, normal. CENTRAL NERVOUS SYSTEM:  No focal neurologic deficit on gross examination.  Labs on Admission:  Results for orders placed during the hospital encounter of 05/24/12 (  from the past 48 hour(s))  URINALYSIS, MICROSCOPIC ONLY     Status: Normal   Collection Time   05/24/12  9:28 AM      Component Value Range Comment   Color, Urine YELLOW  YELLOW    APPearance CLEAR  CLEAR    Specific Gravity, Urine 1.012  1.005 - 1.030    pH 7.0  5.0 - 8.0    Glucose, UA NEGATIVE  NEGATIVE mg/dL    Hgb urine dipstick NEGATIVE  NEGATIVE    Bilirubin Urine NEGATIVE  NEGATIVE    Ketones, ur NEGATIVE  NEGATIVE mg/dL    Protein, ur NEGATIVE  NEGATIVE mg/dL    Urobilinogen, UA 0.2  0.0 - 1.0 mg/dL    Nitrite NEGATIVE  NEGATIVE    Leukocytes, UA NEGATIVE  NEGATIVE    Bacteria, UA RARE  RARE    Urine-Other MUCOUS PRESENT     CBC WITH DIFFERENTIAL     Status: Abnormal   Collection Time   05/24/12 11:10 AM      Component Value Range Comment   WBC 22.1 (*) 4.0 - 10.5 K/uL    RBC 3.63 (*) 4.22 - 5.81 MIL/uL    Hemoglobin 10.4 (*) 13.0 - 17.0 g/dL    HCT 14.7 (*) 82.9 - 52.0 %    MCV 82.9  78.0 - 100.0 fL    MCH 28.7  26.0 - 34.0 pg    MCHC 34.6  30.0 - 36.0 g/dL    RDW 56.2  13.0 - 86.5 %    Platelets 149 (*) 150 - 400 K/uL    Neutrophils Relative 87 (*) 43 - 77 %    Neutro Abs 19.3 (*) 1.7 - 7.7 K/uL    Lymphocytes Relative 3  (*) 12 - 46 %    Lymphs Abs 0.7  0.7 - 4.0 K/uL    Monocytes Relative 10  3 - 12 %    Monocytes Absolute 2.2 (*) 0.1 - 1.0 K/uL    Eosinophils Relative 0  0 - 5 %    Eosinophils Absolute 0.0  0.0 - 0.7 K/uL    Basophils Relative 0  0 - 1 %    Basophils Absolute 0.0  0.0 - 0.1 K/uL   BASIC METABOLIC PANEL     Status: Abnormal   Collection Time   05/24/12 11:10 AM      Component Value Range Comment   Sodium 131 (*) 135 - 145 mEq/L    Potassium 3.9  3.5 - 5.1 mEq/L    Chloride 92 (*) 96 - 112 mEq/L    CO2 27  19 - 32 mEq/L    Glucose, Bld 134 (*) 70 - 99 mg/dL    BUN 44 (*) 6 - 23 mg/dL    Creatinine, Ser 7.84 (*) 0.50 - 1.35 mg/dL    Calcium 9.3  8.4 - 69.6 mg/dL    GFR calc non Af Amer 44 (*) >90 mL/min    GFR calc Af Amer 51 (*) >90 mL/min   POCT I-STAT TROPONIN I     Status: Normal   Collection Time   05/24/12 11:15 AM      Component Value Range Comment   Troponin i, poc 0.08  0.00 - 0.08 ng/mL    Comment 3            PRO B NATRIURETIC PEPTIDE     Status: Abnormal   Collection Time   05/24/12 11:15 AM      Component Value Range  Comment   Pro B Natriuretic peptide (BNP) 2385.0 (*) 0 - 450 pg/mL     Radiological Exams on Admission: *RADIOLOGY REPORT*  Clinical Data: Urinary retention. Hypertension. History of  smoking.  CHEST - 2 VIEW  Comparison: Chest CT 03/15/2012 and chest x-ray 08/09/2011  Findings: The heart is mildly enlarged but stable. There is  tortuosity and calcification of the thoracic aorta. There is an  underlying diffuse interstitial process with superimposed airspace  opacities. Findings could be due to asymmetric pulmonary edema or  underlying bronchitis and superimposed pneumonia. No pleural  effusion. The bony thorax is intact.  IMPRESSION:  Probable severe bronchitis with superimposed right lung  infiltrates. Asymmetric pulmonary edema is also possible.  Original Report Authenticated By: Rudie Meyer, M.D.   Assessment/Plan Active  Problems:   1. CAP (community acquired pneumonia): Patient presented with a one-day history of generalized weakness, preceded by about 3 weeks of a productive cough. CXR confirmed bilateral airspace disease. Wcc is elevated at 22.1, but patient is apyrexial and remarkably comfortable. Will admit and manage with iv Levaquin/Primaxin, aspiration may be a factor, as well as supportive treatment. In view of history of dysphagia, will obtain SLP evaluation.  2. CHF (congestive heart failure): Patient has a known history of CHF. Although ProBNP is somewhat elevated at 2385.0, he does not appear to be in overt decompensation, clinically. Will continue Lasix, in pre-admission doses, for now.  3. PVD (peripheral vascular disease): Patient is sl/p fem-pop bypass in the past. He does not have symptoms of claudication at this timer, and LE appear well perfused. Unfortunately, his scheduled appointment with Dr Arbie Cookey today, was pre-empted by his hospitalization.  4. CAD (coronary artery disease): Stable/asymptomatic.  45. CKD (chronic kidney disease): Renal indices appear stable and at baseline. Patient is currently under care of Dr Camille Bal, nephrologist.  5. Diabetes mellitus type II, controlled: This appears controlled, based on random blood glucose of 134. We shall check HBA1C and manage with diet/SSI.  6. HTN (hypertension): BP appears controlled at this time.   Further management will depend on clinical course.   Comment: Patient is FULL CODE.   Time Spent on Admission: 1 hour.   Kahner Yanik,CHRISTOPHER 05/24/2012, 3:31 PM

## 2012-05-24 NOTE — ED Provider Notes (Signed)
History     CSN: [id number]  Arrival date & time 05/24/12  4098   First MD Initiated Contact with Patient 05/24/12 1043      Chief Complaint  Patient presents with  . Urinary Retention  . Foot Pain    (Consider location/radiation/quality/duration/timing/severity/associated sxs/prior treatment) HPI Comments: Patient is an 78 year old male with an extensive past medical history who presents with weakness that started this morning when he woke up. He reports feeling generalized weakness and being unable to get out of bed. Patient's wife, who is at the bedside, reports the patient having an occasional cough for the past few days but no other associated symptoms. No aggravating/alleviating factors. Patient did not try anything for symptoms. Symptoms progressively worsening since the onset.    Past Medical History  Diagnosis Date  . Kidney disease   . Diabetes mellitus   . Hypertension   . MI (myocardial infarction)     1974  . Peripheral vascular disease     veinsi rerouted in both legs   . Pneumonia     hx of   . Anemia   . Blood transfusion     hx of 6 months ago   . GERD (gastroesophageal reflux disease)   . Arthritis   . Bowel obstruction     Past Surgical History  Procedure Date  . Back surgery     back surgery x 2   . Eye surgery     bilateral cataract surgery with implants   . Other surgical history     2 fatty tumors removed - on different arms   . Other surgical history     prostate surgery x 3   . Other surgical history     veins rerouted in both legs   . Other surgical history     hemorrhoid surgery  . Other surgical history     carpal tunnel right   . Other surgical history     big toe nails removed   . Other surgical history     repair of blood vessel in esophagus and stomach- 2006  . Other surgical history     bilateral kidney stents  2007  . Other surgical history     small bowel obstruction 2011   . Excision/release bursa hip 08/13/2011   Procedure: EXCISION/RELEASE BURSA HIP;  Surgeon: Jacki Cones, MD;  Location: [facility];  Service: Orthopedics;  Laterality: Right;  Right Hip Excision Tumor  . Esophageal manometry 11/08/2011    Procedure: ESOPHAGEAL MANOMETRY (EM);  Surgeon: Theda Belfast, MD;  Location: [facility];  Service: Endoscopy;  Laterality: N/A;    History reviewed. No pertinent family history.  History  Substance Use Topics  . Smoking status: Former Smoker    Quit date: 07/22/1972  . Smokeless tobacco: Never Used  . Alcohol Use: No      Review of Systems  Respiratory: Positive for cough.   Neurological: Positive for weakness.  All other systems reviewed and are negative.    Allergies  Amoxicillin; Apresoline esidrix; and Clarithromycin  Home Medications   Current Outpatient Rx  Name  Route  Sig  Dispense  Refill  . ASPIRIN EC 81 MG PO TBEC   Oral   Take 81 mg by mouth daily before breakfast.          . CALCIUM CARBONATE-VITAMIN D 500-200 MG-UNIT PO TABS   Oral   Take 2 tablets by mouth daily.         Marland Kitchen  CARVEDILOL 25 MG PO TABS   Oral   Take 25 mg by mouth 2 (two) times daily with a meal.         . DESOXIMETASONE 0.25 % EX CREA   Topical   Apply 1 application topically 2 (two) times daily as needed. For legs (itching)         . DOXAZOSIN MESYLATE 4 MG PO TABS   Oral   Take 4 mg by mouth at bedtime.         Marland Kitchen FERROUS SULFATE 325 (65 FE) MG PO TABS   Oral   Take 325 mg by mouth daily with breakfast.         . FUROSEMIDE 40 MG PO TABS   Oral   Take 40 mg by mouth 2 (two) times daily.         Marland Kitchen LOSARTAN POTASSIUM 100 MG PO TABS   Oral   Take 100 mg by mouth daily before breakfast.          . MIRTAZAPINE 30 MG PO TABS   Oral   Take 30 mg by mouth at bedtime.         . ADULT MULTIVITAMIN W/MINERALS CH   Oral   Take 1 tablet by mouth daily before breakfast.          . PRILOSEC PO   Oral   Take 15 mg by mouth daily.         .  OXYCODONE-ACETAMINOPHEN 5-325 MG PO TABS   Oral   Take 1 tablet by mouth every 4 (four) hours as needed. Hip pain         . POLYETHYLENE GLYCOL 3350 PO PACK   Oral   Take 17 g by mouth daily before breakfast.          . ROSUVASTATIN CALCIUM 20 MG PO TABS   Oral   Take 20 mg by mouth at bedtime.          Marland Kitchen SITAGLIPTIN PHOSPHATE 50 MG PO TABS   Oral   Take 50 mg by mouth daily before breakfast.          . VITAMIN B-12 1000 MCG PO TABS   Oral   Take 1,000 mcg by mouth daily before breakfast.          . VITAMIN C 500 MG PO TABS   Oral   Take 500 mg by mouth daily before breakfast.          . ZALEPLON 5 MG PO CAPS   Oral   Take 5 mg by mouth at bedtime.         Marland Kitchen FINASTERIDE 5 MG PO TABS   Oral   Take 5 mg by mouth daily before breakfast.            BP 143/53  Pulse 94  Temp 99.1 F (37.3 C) (Oral)  Resp 18  SpO2 93%  Physical Exam  Nursing note and vitals reviewed. Constitutional: He is oriented to person, place, and time. He appears well-developed and well-nourished. No distress.  HENT:  Head: Normocephalic and atraumatic.  Eyes: Conjunctivae normal and EOM are normal. Pupils are equal, round, and reactive to light. No scleral icterus.  Neck: Normal range of motion. Neck supple.  Cardiovascular: Normal rate and regular rhythm.  Exam reveals no gallop and no friction rub.   No murmur heard. Pulmonary/Chest: Effort normal and breath sounds normal. He has no wheezes. He has no rales. He exhibits no tenderness.  Abdominal: Soft. He exhibits no distension. There is no tenderness. There is no rebound and no guarding.  Musculoskeletal: Normal range of motion.  Neurological: He is alert and oriented to person, place, and time. Coordination normal.       Speech is goal-oriented. Moves limbs without ataxia.   Skin: Skin is warm and dry. He is not diaphoretic.  Psychiatric: He has a normal mood and affect. His behavior is normal.    ED Course  Procedures  (including critical care time)   Date: 05/24/2012  Rate: 83  Rhythm: normal sinus rhythm  QRS Axis: left  Intervals: normal  ST/T Wave abnormalities: nonspecific T wave changes  Conduction Disutrbances:none  Narrative Interpretation: NSR with T wave changes unchanged from previous  Old EKG Reviewed: none available and unchanged    Labs Reviewed  CBC WITH DIFFERENTIAL - Abnormal; Notable for the following:    WBC 22.1 (*)     RBC 3.63 (*)     Hemoglobin 10.4 (*)     HCT 30.1 (*)     Platelets 149 (*)     Neutrophils Relative 87 (*)     Neutro Abs 19.3 (*)     Lymphocytes Relative 3 (*)     Monocytes Absolute 2.2 (*)     All other components within normal limits  BASIC METABOLIC PANEL - Abnormal; Notable for the following:    Sodium 131 (*)     Chloride 92 (*)     Glucose, Bld 134 (*)     BUN 44 (*)     Creatinine, Ser 1.39 (*)     GFR calc non Af Amer 44 (*)     GFR calc Af Amer 51 (*)     All other components within normal limits  URINALYSIS, MICROSCOPIC ONLY  POCT I-STAT TROPONIN I  URINE CULTURE  PRO B NATRIURETIC PEPTIDE   Dg Chest 2 View  05/24/2012  *RADIOLOGY REPORT*  Clinical Data: Urinary retention.  Hypertension.  History of smoking.  CHEST - 2 VIEW  Comparison: Chest CT 03/15/2012 and chest x-ray 08/09/2011  Findings: The heart is mildly enlarged but stable.  There is tortuosity and calcification of the thoracic aorta.  There is an underlying diffuse interstitial process with superimposed airspace opacities.  Findings could be due to asymmetric pulmonary edema or underlying bronchitis and superimposed pneumonia.  No pleural effusion.  The bony thorax is intact.  IMPRESSION:  Probable severe bronchitis with superimposed right lung infiltrates.  Asymmetric pulmonary edema is also possible.   Original Report Authenticated By: Rudie Meyer, M.D.      1. Community acquired pneumonia       MDM  10:57 AM Urinalysis unremarkable. Basic labs and chest xray  pending.   1:14 PM Chest xray shows pneumonia and labs show WBC of 22.   1:58 PM Patient started on IV Levaquin and will be admitted for pneumonia vs. Pulmonary edema by Dr. Brien Few.      Emilia Beck, PA-C 05/24/12 [address] PA-C 05/24/12 1518

## 2012-05-24 NOTE — ED Notes (Addendum)
Pt c/o weakness and swelling in feet d/t neuropathy.  Pt had appt with vascular surgeon today for neuropathy.  Pt also c/o sensation to urinate but is unable to.  Pt reports problems with retention in the past.

## 2012-05-24 NOTE — Progress Notes (Signed)
CARE MANAGEMENT ED NOTE 05/24/2012  Patient:  Nunn,Teagan   Account Number:  [id number]  Date Initiated:  05/24/2012  Documentation initiated by:  Edd Arbour  Subjective/Objective Assessment:   78 yr old male [employer] medicare complete pt c/o weakness imaging shows Probable severe bronchitis with superimposed right lung  infiltrates.  Asymmetric pulmonary edema is also possible. DX pneumonia for admission     Subjective/Objective Assessment Detail:   Noted Cm consult from admission RN for possible need for PT upon d/c --pt choice [facility] to provide [facility] services if needed upon d/c     Action/Plan:   CM reviewed CM Consult, provided choice of home health agency if needed at d/c, provided [facility] contact information CM spoke with [facility] staff at [phone] to make them aware of pt admission   Action/Plan Detail:   Made [facility] aware of pt choice to have [facility] follow him while hospitalized for any d/c needs Artelia Laroche will follow pt   Anticipated DC Date:  05/24/2012     Status Recommendation to Physician:   Result of Recommendation:    Other ED Services  Consult Working Plan    DC Planning Services  Other  Outpatient Services - Pt will follow up   [facility] Choice  HOME HEALTH   Choice offered to / List presented to:       DME agency  [facility]        Status of service:  Completed, signed off  ED Comments:   ED Comments Detail:

## 2012-05-24 NOTE — ED Provider Notes (Signed)
Medical screening examination/treatment/procedure(s) were conducted as a shared visit with non-physician practitioner(s) and myself.  I personally evaluated the patient during the encounter   Loren Racer, MD 05/24/12 1537

## 2012-05-24 NOTE — Progress Notes (Addendum)
ANTIBIOTIC CONSULT NOTE - INITIAL  Pharmacy Consult for Primaxin Indication: Possible aspiration PNA  Allergies  Allergen Reactions  . Amoxicillin Swelling  . Apresoline Esidrix (Hydralazine-Hctz) Itching  . Clarithromycin Itching    Patient Measurements: As of 10/2011: 72kg  Vital Signs: Temp: 99.4 F (37.4 C) (12/04 1450) Temp src: Oral (12/04 1450) BP: 121/46 mmHg (12/04 1450) Pulse Rate: 81  (12/04 1450) Intake/Output from previous day:   Intake/Output from this shift:    Labs:  Basename 05/24/12 1110  WBC 22.1*  HGB 10.4*  PLT 149*  LABCREA --  CREATININE 1.39*   CrCl 36 ml/min/1.41m2 (normalized)  Microbiology: No results found for this or any previous visit (from the past 720 hour(s)). Urine cx ordered  Medical History: Past Medical History  Diagnosis Date  . Kidney disease   . Diabetes mellitus   . Hypertension   . MI (myocardial infarction)     1974  . Peripheral vascular disease     veinsi rerouted in both legs   . Pneumonia     hx of   . Anemia   . Blood transfusion     hx of 6 months ago   . GERD (gastroesophageal reflux disease)   . Arthritis   . Bowel obstruction   . PONV (postoperative nausea and vomiting)     Medications:  Scheduled:    . ipratropium  0.5 mg Nebulization Q6H   And  . albuterol  2.5 mg Nebulization Q6H  . aspirin EC  81 mg Oral QAC breakfast  . atorvastatin  40 mg Oral q1800  . calcium-vitamin D  2 tablet Oral Daily  . carvedilol  25 mg Oral BID WC  . doxazosin  4 mg Oral QHS  . [COMPLETED] fentaNYL  50 mcg Intravenous Once  . ferrous sulfate  325 mg Oral Q breakfast  . finasteride  5 mg Oral QAC breakfast  . furosemide  40 mg Oral BID  . heparin  5,000 Units Subcutaneous Q8H  . imipenem-cilastatin  250 mg Intravenous Once  . insulin aspart  0-5 Units Subcutaneous QHS  . insulin aspart  0-9 Units Subcutaneous TID WC  . losartan  100 mg Oral QAC breakfast  . mirtazapine  30 mg Oral QHS  . multivitamin  with minerals  1 tablet Oral QAC breakfast  . pantoprazole  40 mg Oral Daily  . polyethylene glycol  17 g Oral QAC breakfast  . sodium chloride  3 mL Intravenous Q12H  . sodium chloride  3 mL Intravenous Q12H  . vitamin B-12  1,000 mcg Oral QAC breakfast  . vitamin C  500 mg Oral QAC breakfast   Infusions:    . sodium chloride    . [COMPLETED] levofloxacin (LEVAQUIN) IV 500 mg (05/24/12 1433)  . levofloxacin (LEVAQUIN) IV     Assessment:  27 YOM presents with presumed CAP, possible aspiration  Beginning broad spectrum antibiotics with Levaquin and Primaxin (PCN allergic)  Hx of chronic renal disease, current CrCl ~36 ml/min  Goal of Therapy:  Empiric coverage, dose per renal function  Plan:   Primaxin 250mg  IV q6h  Follow up weight, renal function, adjust as needed  Follow up cultures, de-escalation of abx  Will also adjust Levaquin to 500mg  IV x 1, then 250mg  IV q24h as recommended for CrCl 20-49 ml/min  Loralee Pacas, PharmD, BCPS Pager: [phone] 05/24/2012,4:01 PM

## 2012-05-25 DIAGNOSIS — N189 Chronic kidney disease, unspecified: Secondary | ICD-10-CM

## 2012-05-25 DIAGNOSIS — E871 Hypo-osmolality and hyponatremia: Secondary | ICD-10-CM

## 2012-05-25 DIAGNOSIS — D72829 Elevated white blood cell count, unspecified: Secondary | ICD-10-CM

## 2012-05-25 DIAGNOSIS — I10 Essential (primary) hypertension: Secondary | ICD-10-CM

## 2012-05-25 LAB — COMPREHENSIVE METABOLIC PANEL
Albumin: 2.9 g/dL — ABNORMAL LOW (ref 3.5–5.2)
BUN: 45 mg/dL — ABNORMAL HIGH (ref 6–23)
Calcium: 9.2 mg/dL (ref 8.4–10.5)
Chloride: 93 mEq/L — ABNORMAL LOW (ref 96–112)
Creatinine, Ser: 1.55 mg/dL — ABNORMAL HIGH (ref 0.50–1.35)
Total Bilirubin: 0.4 mg/dL (ref 0.3–1.2)

## 2012-05-25 LAB — CBC
Hemoglobin: 9.2 g/dL — ABNORMAL LOW (ref 13.0–17.0)
MCV: 83.6 fL (ref 78.0–100.0)
Platelets: 128 10*3/uL — ABNORMAL LOW (ref 150–400)
RBC: 3.24 MIL/uL — ABNORMAL LOW (ref 4.22–5.81)
WBC: 15.9 10*3/uL — ABNORMAL HIGH (ref 4.0–10.5)

## 2012-05-25 LAB — URINE CULTURE
Colony Count: NO GROWTH
Culture: NO GROWTH

## 2012-05-25 LAB — HEMOGLOBIN A1C: Hgb A1c MFr Bld: 5.4 % (ref <5.7)

## 2012-05-25 LAB — GLUCOSE, CAPILLARY: Glucose-Capillary: 122 mg/dL — ABNORMAL HIGH (ref 70–99)

## 2012-05-25 LAB — PRO B NATRIURETIC PEPTIDE: Pro B Natriuretic peptide (BNP): 3614 pg/mL — ABNORMAL HIGH (ref 0–450)

## 2012-05-25 MED ORDER — GABAPENTIN 100 MG PO CAPS
100.0000 mg | ORAL_CAPSULE | Freq: Three times a day (TID) | ORAL | Status: DC
  Administered 2012-05-25 – 2012-05-29 (×12): 100 mg via ORAL
  Filled 2012-05-25 (×13): qty 1

## 2012-05-25 MED ORDER — ALBUTEROL SULFATE (5 MG/ML) 0.5% IN NEBU
2.5000 mg | INHALATION_SOLUTION | Freq: Three times a day (TID) | RESPIRATORY_TRACT | Status: DC
  Administered 2012-05-25 – 2012-05-27 (×8): 2.5 mg via RESPIRATORY_TRACT
  Filled 2012-05-25 (×8): qty 0.5

## 2012-05-25 MED ORDER — IPRATROPIUM BROMIDE 0.02 % IN SOLN
0.5000 mg | Freq: Three times a day (TID) | RESPIRATORY_TRACT | Status: DC
  Administered 2012-05-25 – 2012-05-27 (×8): 0.5 mg via RESPIRATORY_TRACT
  Filled 2012-05-25 (×8): qty 2.5

## 2012-05-25 MED ORDER — GUAIFENESIN 100 MG/5ML PO SOLN
200.0000 mg | ORAL | Status: DC | PRN
  Filled 2012-05-25: qty 118

## 2012-05-25 NOTE — Progress Notes (Addendum)
[facility] PROGRESS NOTE  [name] ZOX:[id number] DOB: 1921/04/02 DOA: 05/24/2012 PCP: Londell Moh, MD  Assessment/Plan:  1. CAP (community acquired pneumonia):  Patient on room air but still quite weak.  WBC trending down.   -   Narrow therapy to levofloxacin 2. Acute on chronic CHF (congestive heart failure):  ProBNP is somewhat elevated at 2385.0 and CXR demonstrates possible pulmonary edema. Patient was 1.7L negative yesterday and almost 1 liter negative today. -  Continue Lasix at current dose 3. PVD (peripheral vascular disease): Patient is sl/p fem-pop bypass in the past. He does not have symptoms of claudication at this timer, and LE appear well perfused.  -  Will need appointment with Dr Early rescheduled 4. CAD (coronary artery disease): Stable/asymptomatic.  5. CKD, stage 3 (chronic kidney disease):  Creatinine mildly increased today.  Patient is currently under care of Dr Camille Bal, nephrologist.  Trend 5. Diabetes mellitus type II, controlled:  A1c 5.4.   6. HTN (hypertension): BP appears controlled at this time.  7.  Neuropathy/foot pain: Will start low dose gabapentin  Urinary retention, chronic problem secondary to BPH:  Patient has required indwelling foley previously.  Will d/c foley and do PVR.  If still retaining urine, will replace foley and have patient follow up with urology.   Hyponatremia:  May be due to SIADH from pneumonia.  Trend  DIET:  Diabetic, dysphagia 1 diet with thin liquids ACCESS:  PIV IVF:  None PROPH:  heparin  Code Status: full code Family Communication:  Spoke with patient and wife who was at bedside Disposition Plan:  Pending PT/OT evaluation, continued improvement in respiratory status   Consultants:  none  Procedures:  none  Antibiotics:  Levofloxacin  imipenem   HPI/Subjective:  Patient states that his breathing is much better.  He continues to have some cough.  Denies chest pain, nausea, vomiting,  diarrhea, constipation.  He continues to feel very weak and have pain in his feet which is chronic but worse than baseline and interfering with his ability to bear weight/stand.    Objective: Filed Vitals:   05/25/12 0550 05/25/12 0755 05/25/12 1419 05/25/12 1944  BP: 143/52  133/48   Pulse: 71  69   Temp: 98.6 F (37 C)  98.7 F (37.1 C)   TempSrc: Oral  Oral   Resp: 16  18   Height:      Weight:      SpO2: 98% 93% 97% 99%    Intake/Output Summary (Last 24 hours) at 05/25/12 2000 Last data filed at 05/25/12 1947  Gross per 24 hour  Intake   1120 ml  Output   3450 ml  Net  -2330 ml   Filed Weights   05/24/12 1700  Weight: 80.287 kg (177 lb)    Exam:   General:  Caucasian male, no acute distress  HEENT:  MMM  Cardiovascular: RRR, 3/6 holosystolic murmur at the LSB  Respiratory: Rales bilateral bases left greater than right.  No wheezes or rhonchi  Abdomen: NABS, soft, nondistended, nontender, no organomealy  MSK:  1+ LEE, tenderness of skin of both feet without erythema.    Data Reviewed: Basic Metabolic Panel:  Lab 05/25/12 8119 05/24/12 1110  NA 132* 131*  K 3.9 3.9  CL 93* 92*  CO2 28 27  GLUCOSE 105* 134*  BUN 45* 44*  CREATININE 1.55* 1.39*  CALCIUM 9.2 9.3  MG -- --  PHOS -- --   Liver Function Tests:  Lab 05/25/12 0445  AST 22  ALT 10  ALKPHOS 49  BILITOT 0.4  PROT 6.0  ALBUMIN 2.9*   No results found for this basename: LIPASE:5,AMYLASE:5 in the last 168 hours No results found for this basename: AMMONIA:5 in the last 168 hours CBC:  Lab 05/25/12 0445 05/24/12 1110  WBC 15.9* 22.1*  NEUTROABS -- 19.3*  HGB 9.2* 10.4*  HCT 27.1* 30.1*  MCV 83.6 82.9  PLT 128* 149*   Cardiac Enzymes: No results found for this basename: CKTOTAL:5,CKMB:5,CKMBINDEX:5,TROPONINI:5 in the last 168 hours BNP (last 3 results)  Basename 05/25/12 0440 05/24/12 1115  PROBNP 3614.0* 2385.0*   CBG:  Lab 05/25/12 1705 05/25/12 1139 05/25/12 0809 05/25/12  0744 05/24/12 2120  GLUCAP 104* 141* 120* 122* 115*    Recent Results (from the past 240 hour(s))  URINE CULTURE     Status: Normal   Collection Time   05/24/12 11:24 AM      Component Value Range Status Comment   Specimen Description URINE, CATHETERIZED   Final    Special Requests NONE   Final    Culture  Setup Time 05/24/2012 18:47   Final    Colony Count NO GROWTH   Final    Culture NO GROWTH   Final    Report Status 05/25/2012 FINAL   Final   MRSA PCR SCREENING     Status: Normal   Collection Time   05/25/12  2:16 AM      Component Value Range Status Comment   MRSA by PCR NEGATIVE  NEGATIVE Final      Studies: Dg Chest 2 View  05/24/2012  *RADIOLOGY REPORT*  Clinical Data: Urinary retention.  Hypertension.  History of smoking.  CHEST - 2 VIEW  Comparison: Chest CT 03/15/2012 and chest x-ray 08/09/2011  Findings: The heart is mildly enlarged but stable.  There is tortuosity and calcification of the thoracic aorta.  There is an underlying diffuse interstitial process with superimposed airspace opacities.  Findings could be due to asymmetric pulmonary edema or underlying bronchitis and superimposed pneumonia.  No pleural effusion.  The bony thorax is intact.  IMPRESSION:  Probable severe bronchitis with superimposed right lung infiltrates.  Asymmetric pulmonary edema is also possible.   Original Report Authenticated By: Rudie Meyer, M.D.     Scheduled Meds:   . ipratropium  0.5 mg Nebulization TID   And  . albuterol  2.5 mg Nebulization TID  . aspirin EC  81 mg Oral QAC breakfast  . atorvastatin  40 mg Oral q1800  . calcium-vitamin D  2 tablet Oral Daily  . carvedilol  25 mg Oral BID WC  . doxazosin  4 mg Oral QHS  . ferrous sulfate  325 mg Oral Q breakfast  . finasteride  5 mg Oral QAC breakfast  . furosemide  40 mg Oral BID  . heparin  5,000 Units Subcutaneous Q8H  . imipenem-cilastatin  250 mg Intravenous Q6H  . insulin aspart  0-5 Units Subcutaneous QHS  . insulin  aspart  0-9 Units Subcutaneous TID WC  . levofloxacin (LEVAQUIN) IV  250 mg Intravenous Q24H  . losartan  100 mg Oral QAC breakfast  . mirtazapine  30 mg Oral QHS  . multivitamin with minerals  1 tablet Oral QAC breakfast  . pantoprazole  40 mg Oral Daily  . polyethylene glycol  17 g Oral QAC breakfast  . sodium chloride  3 mL Intravenous Q12H  . sodium chloride  3 mL Intravenous Q12H  . vitamin B-12  1,000  mcg Oral QAC breakfast  . vitamin C  500 mg Oral QAC breakfast  . [DISCONTINUED] albuterol  2.5 mg Nebulization Q6H  . [DISCONTINUED] ipratropium  0.5 mg Nebulization Q6H   Continuous Infusions:   Active Problems:  CAP (community acquired pneumonia)  CHF (congestive heart failure)  PVD (peripheral vascular disease)  CAD (coronary artery disease)  CKD (chronic kidney disease)  Diabetes mellitus type II, controlled  HTN (hypertension)    Time spent: 30    Bueford Arp, [facility]  [facility] Pager [phone]. If 8PM-8AM, please contact night-coverage at [email or web address], password [facility] 05/25/2012, 8:00 PM  LOS: 1 day

## 2012-05-25 NOTE — Clinical Documentation Improvement (Signed)
CHF DOCUMENTATION CLARIFICATION QUERY  THIS DOCUMENT IS NOT A PERMANENT PART OF THE MEDICAL RECORD  TO RESPOND TO THE THIS QUERY, FOLLOW THE INSTRUCTIONS BELOW:  1. If needed, update documentation for the patient's encounter via the notes activity.  2. Access this query again and click edit on the In [employer].  3. After updating, or not, click F2 to complete all highlighted (required) fields concerning your review. Select "additional documentation in the medical record" OR "no additional documentation provided".  4. Click Sign note button.  5. The deficiency will fall out of your In Basket *Please let us know if you are not able to complete this workflow by phone or e-mail (listed below).  Please update your documentation within the medical record to reflect your response to this query.                                                                                    05/25/12  Dear Dr.[facility],  In a better effort to capture your patient's severity of illness, reflect appropriate length of stay and utilization of resources, a review of the patient medical record has revealed the following indicators the diagnosis of Heart Failure.    Based on your clinical judgment, please clarify and document in a progress note and/or discharge summary the clinical condition associated with the following supporting information:  In responding to this query please exercise your independent judgment.  The fact that a query is asked, does not imply that any particular answer is desired or expected. 05/24/12 H&P..."2. CHF (congestive heart failure): Patient has a known history of CHF".Marland KitchenMarland KitchenFor accurate Dx specificity & severity can noted "CHF" be further specified w/ type & acurity. Thank you   Possible Clinical Conditions? CHF NOS . Left Heart Failure . Systolic or Diastolic Congestive Heart Failure . Systolic & Diastolic Congestive Heart Failure  . Chronic Systolic or Diastolic  Congestive Heart Failure . Chronic Systolic & Diastolic Congestive Heart Failure . Acute Systolic or Diastolic Congestive Heart Failure . Acute Systolic & Diastolic Congestive Heart Failure  . Acute on Chronic Systolic or Diastolic Congestive Heart Failure . Acute on Chronic Systolic & Diastolic Congestive Heart Failure  . Other Condition . Cannot Clinically Determine  Supporting Information: Risk Factors: 05/24/12 H&P...".Marland KitchenMarland KitchenPatient has a known history of CHF"...  Signs & Symptoms: 05/24/12 H&P..."CHF (congestive heart failure): Patient has a known history of CHF. Although ProBNP is somewhat elevated at 2385.0, he does not appear to be in overt decompensation, clinically.".Marland KitchenMarland Kitchen  Diagnostics: BNP:05/24/12 H&P .Marland KitchenMarland Kitchen"ProBNP is somewhat elevated at 2385.0"...  Echo results: none noted EF: EKG:  Radiology: 05/24/12: CXR: IMPRESSION:Probable severe bronchitis with superimposed right lung infiltrates. Asymmetric pulmonary edema is also possible.   Treatment: 05/24/12 H&P..Marland Kitchen"Will continue Lasix, in pre-admission doses, for now.".Marland KitchenMarland Kitchen    Reviewed: no additional documentation: 05/30/12 dc summ rev, no further specificity added. orm Thank You, Toribio Harbour, RN, BSN, CCDS Certified Clinical Documentation Specialist Pager: [phone]  Health Information [facility] [place]

## 2012-05-25 NOTE — Progress Notes (Signed)
   CARE MANAGEMENT NOTE 05/25/2012  Patient:  [name],[name]   Account Number:  [id number]  Date Initiated:  05/25/2012  Documentation initiated by:  Jiles Crocker  Subjective/Objective Assessment:   ADMITTED WITH PNEUMONIA     Action/Plan:   PCP IS  DR Londell Moh, MD  LIVES AT HOME WITH SPOUSE; PER ERCM NOTES - PATIENT CHOSE [facility] FOR HHC SERVICES AT DISCHARGE; CM WILL CONTINUE TO FOLLOW FOR OTHER HHC NEEDS   Anticipated DC Date:  06/01/2012   Anticipated DC Plan:  HOME W HOME HEALTH SERVICES      DC Planning Services  CM consult       Status of service:  In process, will continue to follow Medicare Important Message given?  NA - LOS <3 / Initial given by admissions (If response is "NO", the following Medicare IM given date fields will be blank)  Per UR Regulation:  Reviewed for med. necessity/level of care/duration of stay   Comments:  05/25/2012- B Lenette Rau RN,BSN,MHA

## 2012-05-25 NOTE — Evaluation (Signed)
Clinical/Bedside Swallow Evaluation Patient Details  Name: [name] MRN: [id number] Date of Birth: [birth year]  Today's Date: 05/25/2012 Time: 4098-1191 SLP Time Calculation (min): 44 min  Past Medical History:  Past Medical History  Diagnosis Date  . Kidney disease   . Diabetes mellitus   . Hypertension   . MI (myocardial infarction)     1974  . Peripheral vascular disease     veinsi rerouted in both legs   . Pneumonia     hx of   . Anemia   . Blood transfusion     hx of 6 months ago   . GERD (gastroesophageal reflux disease)   . Arthritis   . Bowel obstruction   . PONV (postoperative nausea and vomiting)    Past Surgical History:  Past Surgical History  Procedure Date  . Back surgery     back surgery x 2   . Eye surgery     bilateral cataract surgery with implants   . Other surgical history     2 fatty tumors removed - on different arms   . Other surgical history     prostate surgery x 3   . Other surgical history     veins rerouted in both legs   . Other surgical history     hemorrhoid surgery  . Other surgical history     carpal tunnel right   . Other surgical history     big toe nails removed   . Other surgical history     repair of blood vessel in esophagus and stomach- 2006  . Other surgical history     bilateral kidney stents  2007  . Other surgical history     small bowel obstruction 2011   . Excision/release bursa hip 08/13/2011    Procedure: EXCISION/RELEASE BURSA HIP;  Surgeon: Jacki Cones, MD;  Location: [facility] ORS;  Service: Orthopedics;  Laterality: Right;  Right Hip Excision Tumor  . Esophageal manometry 11/08/2011    Procedure: ESOPHAGEAL MANOMETRY (EM);  Surgeon: Theda Belfast, MD;  Location: [facility] ENDOSCOPY;  Service: Endoscopy;  Laterality: N/A;   HPI:  78 yo male adm to [facility]/24/12 with HCAP, urinary retention, HTN.  MD ordered a swallow evaluation due to concerns for aspiration.  PMH + for esophageal dysmotility, gastritis, previous  smoker.  Pt has had extensive work up for dysphagia including barium swallow 02/19/2011 showed nonspecific esophageal dysmotility-disruption of primary peristaltic wave and tertiary contractions.  He has also seen a Doctor, general practice at outpt in June of this year.  He also had manometry completed but results are not found.  Pt CXR showed severe bronchitis and right lung infiltrate.  Pt and spouse report he has been maintained on a pureed/thin diet without issues.  He denies reflux symptoms and takes omeprazole 15 mg every morning.  Pt does complain of inability to get secretions out, he takes liquid Guaifenesin at home and is concerned about not having it here.     Assessment / Plan / Recommendation Clinical Impression  Pt without cranial nerve deficits nor h/o CVA, neuro dx per pt/spouse.  SLP reviewed previous swallow test results with pt, spouse and observed pt to swallow applesauce and water.  Swallow is timely with clear voice throughout.  Pt has been consuming puree/thin diet at home without any problems per his statement.  He denies dysphagia symptoms with his diet modification.  Advised pt and spouse to compensatory strategies to mitigate known esophageal deficits and that primary asp  risk with from esoph dysmotilty.  Pt admits xerostomia, provided written/verbal tips to compensate.  Pt adamently declines further work up of his dysphagia stating he is managing fine - please note, SLP had not advised further SLP input.  SLP provided tips in writing and contact number and advised to further modifications, compensatory strategies if pt becomes bedridden.  Pt and spouse expressed gratitude.  Rec continue puree and thin.  Thanks for the consult.      Aspiration Risk  Mild    Diet Recommendation Dysphagia 1 (Puree);Thin liquid   Liquid Administration via: Cup;Straw Medication Administration: Crushed with puree Supervision: Patient able to self feed Compensations: Slow rate;Small sips/bites Postural  Changes and/or Swallow Maneuvers: Seated upright 90 degrees;Upright 30-60 min after meal    Other  Recommendations Oral Care Recommendations: Oral care BID   Follow Up Recommendations  None    Frequency and Duration        Pertinent Vitals/Pain Afebrile, decreased    SLP Swallow Goals  n/a eval, educate and dc   Swallow Study Prior Functional Status   puree/thin diet at home, self feeds, ambulates some with device    General HPI: 77 yo male adm to [facility] 05/24/12 with HCAP, urinary retention, HTN.  MD ordered a swallow evaluation due to concerns for aspiration.  PMH + for esophageal dysmotility, gastritis, previous smoker.  Pt has had extensive work up for dysphagia including barium swallow 02/19/2011 showed nonspecific esophageal dysmotility-disruption of primary peristaltic wave and tertiary contractions.  He has also seen a Doctor, general practice at outpt in June of this year.  He also had manometry completed but results are not found.  Pt CXR showed severe bronchitis and right lung infiltrate.  Pt and spouse report he has been maintained on a pureed/thin diet without issues.  He denies reflux symptoms and takes omeprazole 15 mg every morning.  Pt does complain of inability to get secretions out, he takes liquid Guaifenesin at home and is concerned about not having it here.   Type of Study: Bedside swallow evaluation Diet Prior to this Study: Dysphagia 1 (puree);Thin liquids Respiratory Status: Room air History of Recent Intubation: No Behavior/Cognition: Alert;Cooperative (wants to go home) Oral Cavity - Dentition: Edentulous (no dentures:  do not fit properly) Self-Feeding Abilities: Able to feed self Patient Positioning: Upright in bed Baseline Vocal Quality: Clear Volitional Cough: Strong Volitional Swallow: Able to elicit    Oral/Motor/Sensory Function Overall Oral Motor/Sensory Function: Appears within functional limits for tasks assessed   Ice Chips Ice chips: Not tested   Thin  Liquid Thin Liquid: Within functional limits Presentation: Self Fed;Cup Other Comments: delayed cough x1 - but cough also observed prior to po initiation- four boluses consumed    Nectar Thick Nectar Thick Liquid: Not tested   Honey Thick Honey Thick Liquid: Not tested   Puree Puree: Within functional limits Presentation: Self Fed;Spoon Other Comments: four ounces consumed    Solid   GO    Solid: Not tested Other Comments: pt on pureed diet at home       Mills Koller, MS [facility] SLP [phone]

## 2012-05-26 DIAGNOSIS — D72829 Elevated white blood cell count, unspecified: Secondary | ICD-10-CM

## 2012-05-26 DIAGNOSIS — E871 Hypo-osmolality and hyponatremia: Secondary | ICD-10-CM

## 2012-05-26 DIAGNOSIS — I509 Heart failure, unspecified: Secondary | ICD-10-CM

## 2012-05-26 LAB — BASIC METABOLIC PANEL
BUN: 35 mg/dL — ABNORMAL HIGH (ref 6–23)
Calcium: 9.2 mg/dL (ref 8.4–10.5)
Chloride: 96 mEq/L (ref 96–112)
Creatinine, Ser: 1.43 mg/dL — ABNORMAL HIGH (ref 0.50–1.35)
GFR calc Af Amer: 49 mL/min — ABNORMAL LOW (ref >90)

## 2012-05-26 LAB — CBC
HCT: 29.3 % — ABNORMAL LOW (ref 39.0–52.0)
MCH: 28.6 pg (ref 26.0–34.0)
MCHC: 33.8 g/dL (ref 30.0–36.0)
MCV: 84.7 fL (ref 78.0–100.0)
RDW: 14.2 % (ref 11.5–15.5)
WBC: 12.1 10*3/uL — ABNORMAL HIGH (ref 4.0–10.5)

## 2012-05-26 LAB — GLUCOSE, CAPILLARY: Glucose-Capillary: 110 mg/dL — ABNORMAL HIGH (ref 70–99)

## 2012-05-26 MED ORDER — DOCUSATE SODIUM 100 MG PO CAPS
100.0000 mg | ORAL_CAPSULE | Freq: Two times a day (BID) | ORAL | Status: DC
  Administered 2012-05-26 – 2012-05-29 (×5): 100 mg via ORAL
  Filled 2012-05-26 (×7): qty 1

## 2012-05-26 MED ORDER — SENNA 8.6 MG PO TABS
1.0000 | ORAL_TABLET | Freq: Every day | ORAL | Status: DC | PRN
  Administered 2012-05-28: 8.6 mg via ORAL
  Filled 2012-05-26: qty 1

## 2012-05-26 MED ORDER — LABETALOL HCL 5 MG/ML IV SOLN
5.0000 mg | INTRAVENOUS | Status: DC | PRN
  Filled 2012-05-26: qty 4

## 2012-05-26 NOTE — Progress Notes (Addendum)
[facility] PROGRESS NOTE  [name] NWG:[id number] DOB: 1921-03-19 DOA: 05/24/2012 PCP: Londell Moh, MD  Assessment/Plan:  1. CAP (community acquired pneumonia):  Patient on room air but still quite weak.  WBC trending down, but had fever this morning and is more confused.   -  Continue levofloxacin day 3 -  Blood culture x 2 -  Sputum culture 2. CHF (congestive heart failure), acute on chronic:  ProBNP is somewhat elevated at 2385.0 and CXR demonstrates possible pulmonary edema. Patient was 2.2L negative yesterday.  Less lower extremity edema today after diuresis.   -  Continue Lasix at current dose 3. PVD (peripheral vascular disease): Patient is sl/p fem-pop bypass in the past. He does not have symptoms of claudication at this timer, and LE appear well perfused.  -  Will need appointment with Dr Early rescheduled 4. CAD (coronary artery disease): Stable/asymptomatic.  5. CKD (chronic kidney disease), stage III:  Creatinine improving today.  Patient is currently under care of Dr Camille Bal, nephrologist.  Trend 5. Diabetes mellitus type II, controlled:  A1c 5.4.   6. HTN (hypertension): elevated BP today. Control pain and add labetalol 5mg  IV prn SBP>160 or DBP>100 7.  Neuropathy/foot pain: Will start low dose gabapentin  8.  Urinary retention, chronic problem secondary to BPH:  Patient voiding without difficulty after foley removed yesterday  9.  Hyponatremia, slightly improved since yesterday:  May be due to SIADH from pneumonia.  Trend 10.  Constipation:  Continue miralax.  Add colace and senna  DIET:  Diabetic, dysphagia 1 diet with thin liquids ACCESS:  PIV IVF:  None PROPH:  heparin  Code Status: full code Family Communication:  Spoke with patient and wife who was at bedside Disposition Plan:  Pending PT/OT evaluation, continued improvement in respiratory status, cultures negative at least 24 to 48 hours.      Consultants:  none  Procedures:  none  Antibiotics:  Levofloxacin 12/4 >>  Imipenem 12/4 >> 12/5   HPI/Subjective:  Patient continues to feel very weak.  Denies SOB, but has cough productive of thick green sputum.  States the pain in his feet is at baseline and therefore improved from admission.  Denies nausea, vomiting, abdominal pain.  He feels somewhat distended and has not had a BM in 2 days.    Objective: Filed Vitals:   05/25/12 1944 05/25/12 2053 05/26/12 0500 05/26/12 0932  BP:  149/50 150/46   Pulse:  72 69   Temp:  98.6 F (37 C) 100.9 F (38.3 C)   TempSrc:  Oral Oral   Resp:  20 18   Height:      Weight:   74.8 kg (164 lb 14.5 oz)   SpO2: 99% 98% 94% 91%    Intake/Output Summary (Last 24 hours) at 05/26/12 1352 Last data filed at 05/26/12 1223  Gross per 24 hour  Intake    760 ml  Output   3775 ml  Net  -3015 ml   Filed Weights   05/24/12 1700 05/26/12 0500  Weight: 80.287 kg (177 lb) 74.8 kg (164 lb 14.5 oz)    Exam:   General:  Caucasian male, no acute distress  HEENT:  MMM  Cardiovascular: RRR, 3/6 holosystolic murmur at the LSB  Respiratory: Rales bilateral bases left greater than right and rhonchi.  No wheezes.  Abdomen: NABS, soft, nondistended, nontender, no organomealy  MSK:  trace LEE, less tenderness of skin of both feet  Data Reviewed: Basic Metabolic Panel:  Lab  05/26/12 0537 05/25/12 0445 05/24/12 1110  NA 133* 132* 131*  K 3.6 3.9 3.9  CL 96 93* 92*  CO2 28 28 27   GLUCOSE 121* 105* 134*  BUN 35* 45* 44*  CREATININE 1.43* 1.55* 1.39*  CALCIUM 9.2 9.2 9.3  MG -- -- --  PHOS -- -- --   Liver Function Tests:  Lab 05/25/12 0445  AST 22  ALT 10  ALKPHOS 49  BILITOT 0.4  PROT 6.0  ALBUMIN 2.9*   No results found for this basename: LIPASE:5,AMYLASE:5 in the last 168 hours No results found for this basename: AMMONIA:5 in the last 168 hours CBC:  Lab 05/26/12 0537 05/25/12 0445 05/24/12 1110  WBC 12.1*  15.9* 22.1*  NEUTROABS -- -- 19.3*  HGB 9.9* 9.2* 10.4*  HCT 29.3* 27.1* 30.1*  MCV 84.7 83.6 82.9  PLT 124* 128* 149*   Cardiac Enzymes: No results found for this basename: CKTOTAL:5,CKMB:5,CKMBINDEX:5,TROPONINI:5 in the last 168 hours BNP (last 3 results)  Basename 05/25/12 0440 05/24/12 1115  PROBNP 3614.0* 2385.0*   CBG:  Lab 05/26/12 1156 05/26/12 0747 05/25/12 2056 05/25/12 1705 05/25/12 1139  GLUCAP 110* 129* 138* 104* 141*    Recent Results (from the past 240 hour(s))  URINE CULTURE     Status: Normal   Collection Time   05/24/12 11:24 AM      Component Value Range Status Comment   Specimen Description URINE, CATHETERIZED   Final    Special Requests NONE   Final    Culture  Setup Time 05/24/2012 18:47   Final    Colony Count NO GROWTH   Final    Culture NO GROWTH   Final    Report Status 05/25/2012 FINAL   Final   MRSA PCR SCREENING     Status: Normal   Collection Time   05/25/12  2:16 AM      Component Value Range Status Comment   MRSA by PCR NEGATIVE  NEGATIVE Final      Studies: No results found.  Scheduled Meds:    . ipratropium  0.5 mg Nebulization TID   And  . albuterol  2.5 mg Nebulization TID  . aspirin EC  81 mg Oral QAC breakfast  . atorvastatin  40 mg Oral q1800  . calcium-vitamin D  2 tablet Oral Daily  . carvedilol  25 mg Oral BID WC  . doxazosin  4 mg Oral QHS  . ferrous sulfate  325 mg Oral Q breakfast  . finasteride  5 mg Oral QAC breakfast  . furosemide  40 mg Oral BID  . gabapentin  100 mg Oral TID  . heparin  5,000 Units Subcutaneous Q8H  . insulin aspart  0-5 Units Subcutaneous QHS  . insulin aspart  0-9 Units Subcutaneous TID WC  . levofloxacin (LEVAQUIN) IV  250 mg Intravenous Q24H  . losartan  100 mg Oral QAC breakfast  . mirtazapine  30 mg Oral QHS  . multivitamin with minerals  1 tablet Oral QAC breakfast  . pantoprazole  40 mg Oral Daily  . polyethylene glycol  17 g Oral QAC breakfast  . sodium chloride  3 mL Intravenous  Q12H  . sodium chloride  3 mL Intravenous Q12H  . vitamin B-12  1,000 mcg Oral QAC breakfast  . vitamin C  500 mg Oral QAC breakfast  . [DISCONTINUED] imipenem-cilastatin  250 mg Intravenous Q6H   Continuous Infusions:   Active Problems:  CAP (community acquired pneumonia)  CHF (congestive heart failure)  PVD (peripheral vascular disease)  CAD (coronary artery disease)  CKD (chronic kidney disease)  Diabetes mellitus type II, controlled  HTN (hypertension)  Leukocytosis  Anemia  Hyponatremia    Time spent: 30    Aviyon Hocevar, [facility]  [facility] Pager [phone]. If 8PM-8AM, please contact night-coverage at [email or web address], password [facility] 05/26/2012, 1:52 PM  LOS: 2 days

## 2012-05-26 NOTE — Evaluation (Signed)
Physical Therapy Evaluation Patient Details Name: [name] MRN: [id number] DOB: May 27, 1921 Today's Date: 05/26/2012 Time: 4098-1191 PT Time Calculation (min): 24 min  PT Assessment / Plan / Recommendation Clinical Impression  Pt. was admitted for pneumonia on 12/04 from Home. Per pt. he lives with wife. Pt. is unsteady with ambulation, noted LLE to be mildly ataxic. No family present for history. Pt. will benefit from PT while in acute care.If. DC is to home, recommend HHPT. Currently,  pt will require assistance when ambulating due to imbalance.      PT Assessment  Patient needs continued PT services    Follow Up Recommendations  Home health PT;SNF (no family present to discuss caregiver availability.)    Does the patient have the potential to tolerate intense rehabilitation      Barriers to Discharge Decreased caregiver support      Equipment Recommendations  None recommended by PT    Recommendations for Other Services     Frequency Min 3X/week    Precautions / Restrictions Precautions Precautions: Fall Restrictions Weight Bearing Restrictions: No   Pertinent Vitals/Pain C/o dizzines, BP 153/51. RN aware.      Mobility  Bed Mobility Bed Mobility: Supine to Sit Supine to Sit: [facility] elevated;3: Mod assist Details for Bed Mobility Assistance: tactile cues for activity to keep on task.  cues to scoot. Transfers Transfers: Sit to Stand;Stand to Sit Sit to Stand: From chair/3-in-1;From bed;With upper extremity assist;1: +2 Total assist Stand to Sit: To chair/3-in-1;With armrests;With upper extremity assist;4: Min assist Details for Transfer Assistance: cues to push from bed and to reach back but pt. did not reach. stood again from recliner with arms with mod assistance. Ambulation/Gait Ambulation/Gait Assistance: 1: +2 Total assist Ambulation/Gait: Patient Percentage: 60% Ambulation Distance (Feet): 15 Feet Assistive device: Rolling walker Gait Pattern:  Step-through pattern;Ataxic;Decreased stride length (left.) General Gait Details: pt required stabilizinzation during ambulation, noted dyscontrol LLE.     Shoulder Instructions     Exercises     PT Diagnosis: Abnormality of gait;Generalized weakness;Acute pain  PT Problem List: Decreased strength;Decreased activity tolerance;Decreased balance;Decreased mobility;Decreased coordination;Decreased knowledge of precautions;Pain;Decreased safety awareness;Decreased cognition;Decreased knowledge of use of DME;Impaired sensation PT Treatment Interventions: DME instruction;Gait training;Functional mobility training;Therapeutic activities;Therapeutic exercise;Patient/family education   PT Goals Acute Rehab PT Goals PT Goal Formulation: Patient unable to participate in goal setting Time For Goal Achievement: 06/09/12 Potential to Achieve Goals: Good Pt will go Supine/Side to Sit: with supervision;with HOB 0 degrees PT Goal: Supine/Side to Sit - Progress: Goal set today Pt will go Sit to Supine/Side: with supervision;with HOB 0 degrees PT Goal: Sit to Supine/Side - Progress: Goal set today Pt will go Sit to Stand: with supervision PT Goal: Sit to Stand - Progress: Goal set today Pt will go Stand to Sit: with supervision PT Goal: Stand to Sit - Progress: Goal set today Pt will Ambulate: >150 feet;with supervision;with least restrictive assistive device PT Goal: Ambulate - Progress: Goal set today Pt will Perform Home Exercise Program: with supervision, verbal cues required/provided PT Goal: Perform Home Exercise Program - Progress: Goal set today  Visit Information  Last PT Received On: 05/26/12 Assistance Needed: +2    Subjective Data  Subjective: I don't know where I am at.  Patient Stated Goal: Pt agreeable to get up   Prior Functioning  Home Living Lives With: Spouse Available Help at Discharge: Family Type of Home: House Home Access: Ramped entrance Home Layout: One  level Bathroom Shower/Tub: Engineer, manufacturing systems: Handicapped  height Home Adaptive Equipment: Wheelchair - manual Prior Function Level of Independence: Independent with assistive device(s) Driving: No Communication Communication: No difficulties;HOH (mild)    Cognition  Overall Cognitive Status: No family/caregiver present to determine baseline cognitive functioning Area of Impairment: Memory Arousal/Alertness: Awake/alert Orientation Level: Disoriented to;Situation;Place Behavior During Session: Flat affect    Extremity/Trunk Assessment Right Lower Extremity Assessment RLE ROM/Strength/Tone: Deficits RLE ROM/Strength/Tone Deficits: grosslu 4/5 for ambulation. RLE Sensation: History of peripheral neuropathy;Deficits RLE Sensation Deficits: feels LT, did not fully answer to testing. Left Lower Extremity Assessment LLE ROM/Strength/Tone: Deficits LLE ROM/Strength/Tone Deficits: grossly 4/5 for ambulation LLE Sensation: Deficits;History of peripheral neuropathy LLE Sensation Deficits: similar to R LLE Coordination: Deficits LLE Coordination Deficits: pt with noted ataxia, dyscontrol of leg more than R Trunk Assessment Trunk Assessment: Normal   Balance Static Standing Balance Static Standing - Balance Support: Bilateral upper extremity supported Static Standing - Level of Assistance: 4: Min assist Static Standing - Comment/# of Minutes: with RW. requires some UE support.  End of Session PT - End of Session Equipment Utilized During Treatment: Gait belt Activity Tolerance: Patient limited by fatigue (c/o dizziness.) Patient left: in chair;with call bell/phone within reach;with chair alarm set Nurse Communication: Mobility status;Precautions  GP     Rada Hay 05/26/2012, 9:14 AM

## 2012-05-26 NOTE — Evaluation (Signed)
Occupational Therapy Evaluation Patient Details Name: [name] MRN: [id number] DOB: 01-21-1921 Today's Date: 05/26/2012 Time: 7829-5621 OT Time Calculation (min): 20 min  OT Assessment / Plan / Recommendation Clinical Impression  This 78 year old man was admitted for CAP.  PMH significant for chf, pvd, cad, ckd, htn, dm and neuropathy.  He will benefit from continued OT to increase independence and safety with adls with min A level goals in acute    OT Assessment  Patient needs continued OT Services    Follow Up Recommendations  SNF (vs hhot with 24/7 depending upon progress/help available)    Barriers to Discharge      Equipment Recommendations  None recommended by PT    Recommendations for Other Services    Frequency  Min 2X/week    Precautions / Restrictions Precautions Precautions: Fall Restrictions Weight Bearing Restrictions: No   Pertinent Vitals/Pain Hurt all over per pt:  Repositioned.  Pt dizzy when walking  BP 153/51 after a few minutes due to not being able to locate dynamap quickly.      ADL  Eating/Feeding: Performed;Set up (pt was already set up) Where Assessed - Eating/Feeding: Bed level Grooming: Simulated;Set up Where Assessed - Grooming: Unsupported sitting Upper Body Bathing: Simulated;Supervision/safety Where Assessed - Upper Body Bathing: Unsupported sitting Lower Body Bathing: Simulated;Moderate assistance Where Assessed - Lower Body Bathing: Supported sit to stand Upper Body Dressing: Simulated;Minimal assistance Where Assessed - Upper Body Dressing: Unsupported sitting Lower Body Dressing: Simulated;Maximal assistance Where Assessed - Lower Body Dressing: Supported sit to Pharmacist, hospital: +2 Total assistance Toilet Transfer: Patient Percentage: 60% Toilet Transfer Method: Sit to stand Toileting - Architect and Hygiene: Simulated;+2 Total assistance Toileting - Clothing Manipulation and Hygiene: Patient Percentage:  50% Transfers/Ambulation Related to ADLs: ambulated around bed to chair.  Pt said he was a little dizzy +2 for safety ADL Comments: Pt not oriented this am; needs cues for adls for unfamiliar supplies    OT Diagnosis: Generalized weakness  OT Problem List: Decreased strength;Decreased activity tolerance;Impaired balance (sitting and/or standing);Cardiopulmonary status limiting activity;Pain;Decreased range of motion;Decreased safety awareness;Decreased cognition OT Treatment Interventions: Self-care/ADL training;Therapeutic activities;Patient/family education;Balance training;Cognitive remediation/compensation;DME and/or AE instruction   OT Goals Acute Rehab OT Goals OT Goal Formulation: Patient unable to participate in goal setting Time For Goal Achievement: 06/09/12 ADL Goals Pt Will Perform Grooming: with min assist;Standing at sink;Supported (min guard) ADL Goal: Grooming - Progress: Goal set today Pt Will Transfer to Toilet: with min assist;Ambulation;3-in-1 ADL Goal: Statistician - Progress: Goal set today Pt Will Perform Toileting - Hygiene: with min assist;Sit to stand from 3-in-1/toilet ADL Goal: Toileting - Hygiene - Progress: Goal set today Miscellaneous OT Goals Miscellaneous OT Goal #1: Pt will complete upper body adls with set up OT Goal: Miscellaneous Goal #1 - Progress: Goal set today Miscellaneous OT Goal #2: Pt will complete LB adls with min A sit to stand and min cues for hand placement OT Goal: Miscellaneous Goal #2 - Progress: Goal set today Miscellaneous OT Goal #3: Pt will be oriented to place and be able to demonstrate use of call bell for needs OT Goal: Miscellaneous Goal #3 - Progress: Goal set today  Visit Information  Last OT Received On: 05/26/12 Assistance Needed: +2 PT/OT Co-Evaluation/Treatment: Yes    Subjective Data  Subjective: I don't know what this is or where I am Patient Stated Goal: none stated   Prior Functioning     Home  Living Lives With: Spouse Available Help at  Discharge: Family Type of Home: House Home Access: Ramped entrance Home Layout: One level Bathroom Shower/Tub: Engineer, manufacturing systems: Handicapped height Home Adaptive Equipment: Wheelchair - manual Additional Comments: pt usually washes at sink Prior Function Level of Independence: Needs assistance (adls, at times.  Information from pt) Driving: No Communication Communication: No difficulties;HOH Dominant Hand: Right         Vision/Perception     Cognition  Overall Cognitive Status: No family/caregiver present to determine baseline cognitive functioning Area of Impairment: Memory Arousal/Alertness: Awake/alert Orientation Level: Disoriented to;Situation;Place.  Told WL a couple of times--pt said "if that's where I'm at".  Showed him [facility] badges. Behavior During Session: Flat affect    Extremity/Trunk Assessment Right Upper Extremity Assessment RUE ROM/Strength/Tone: Deficits RUE ROM/Strength/Tone Deficits: shoulder limited to about 70 degrees bil Left Upper Extremity Assessment LUE ROM/Strength/Tone: Deficits LUE ROM/Strength/Tone Deficits: see above  Trunk Assessment: Normal     Mobility Bed Mobility Bed Mobility: Supine to Sit Supine to Sit: 3: Mod assist;HOB elevated Details for Bed Mobility Assistance: tactile cues for activity to keep on task.  cues to scoot. Transfers Sit to Stand: From chair/3-in-1;From bed;With upper extremity assist;1: +2 Total assist Sit to Stand: Patient Percentage: 60% Stand to Sit: To chair/3-in-1 Details for Transfer Assistance: cues to push from bed and to reach back but pt. did not reach. stood aggain from recliner with arms with mod assistance.     Shoulder Instructions     Exercise     Balance Static Standing Balance Static Standing - Balance Support: Bilateral upper extremity supported Static Standing - Level of Assistance: 4: Min assist Static Standing - Comment/# of  Minutes: with RW. requires some UE support.   End of Session OT - End of Session Activity Tolerance: Patient limited by fatigue Patient left: in chair;with call bell/phone within reach;with chair alarm set  GO     Dennis Hegeman 05/26/2012, 10:55 AM Marica Otter, OTR/L [phone] 05/26/2012

## 2012-05-27 DIAGNOSIS — D649 Anemia, unspecified: Secondary | ICD-10-CM

## 2012-05-27 LAB — EXPECTORATED SPUTUM ASSESSMENT W GRAM STAIN, RFLX TO RESP C: Special Requests: NORMAL

## 2012-05-27 LAB — BASIC METABOLIC PANEL
Chloride: 93 mEq/L — ABNORMAL LOW (ref 96–112)
GFR calc Af Amer: 48 mL/min — ABNORMAL LOW (ref >90)
Potassium: 3.9 mEq/L (ref 3.5–5.1)

## 2012-05-27 LAB — GLUCOSE, CAPILLARY
Glucose-Capillary: 124 mg/dL — ABNORMAL HIGH (ref 70–99)
Glucose-Capillary: 106 mg/dL — ABNORMAL HIGH (ref 70–99)
Glucose-Capillary: 118 mg/dL — ABNORMAL HIGH (ref 70–99)

## 2012-05-27 LAB — CBC
Platelets: 150 10*3/uL (ref 150–400)
RDW: 14.2 % (ref 11.5–15.5)
WBC: 10.1 10*3/uL (ref 4.0–10.5)

## 2012-05-27 MED ORDER — ALBUTEROL SULFATE (5 MG/ML) 0.5% IN NEBU: 2.5000 mg | INHALATION_SOLUTION | RESPIRATORY_TRACT | Status: DC | PRN

## 2012-05-27 MED ORDER — ALBUTEROL SULFATE (5 MG/ML) 0.5% IN NEBU
2.5000 mg | INHALATION_SOLUTION | Freq: Two times a day (BID) | RESPIRATORY_TRACT | Status: DC
  Administered 2012-05-28 – 2012-05-29 (×3): 2.5 mg via RESPIRATORY_TRACT
  Filled 2012-05-27 (×3): qty 0.5

## 2012-05-27 NOTE — Progress Notes (Signed)
[facility] PROGRESS NOTE  [name] ZOX:[id number] DOB: [birth year] DOA: 05/24/2012 PCP: Londell Moh, MD  Assessment/Plan:  1. CAP (community acquired pneumonia):  Patient on room air but still quite weak.  WBC trending down, and fever improving.     -  Continue levofloxacin day 4 -  Blood culture x 2 pending -  Sputum culture pending 2. CHF (congestive heart failure), acute on chronic:  ProBNP 2385.0 and CXR demonstrated pulmonary edema. Patient was 1L negative yesterday.  Less lower extremity edema again today.   -  Continue lasix at current dose 3. PVD (peripheral vascular disease): Patient is sl/p fem-pop bypass in the past. He does not have symptoms of claudication at this timer, and LE appear well perfused.  -  Will need appointment with Dr Early rescheduled 4. CAD (coronary artery disease): Stable/asymptomatic.  5. CKD (chronic kidney disease), stage III:  Creatinine stable today.  Patient is currently under care of Dr Camille Bal, nephrologist.  Trend 5. Diabetes mellitus type II, controlled:  A1c 5.4.   6. HTN (hypertension): elevated BP today. Control pain and add labetalol 5mg  IV prn SBP>160 or DBP>100 7.  Neuropathy/foot pain: Continue low dose gabapentin  8.  Urinary retention, chronic problem secondary to BPH:  Patient voiding without difficulty 9.  Hyponatremia, trending down slightly:  May be due to SIADH from pneumonia.  Trend 10.  Constipation:  Continue miralax, colace and senna  DIET:  Diabetic, dysphagia 1 diet with thin liquids ACCESS:  PIV IVF:  None PROPH:  heparin  Code Status: full code Family Communication:  Spoke with patient and wife who was at bedside Disposition Plan:  To SNF on Monday, Social work consult placed.  Family would prefer [facility] if possible.       Consultants:  none  Procedures:  none  Antibiotics:  Levofloxacin 12/4 >>  Imipenem 12/4 >> 12/5   HPI/Subjective:  Patient continues to feel very weak.   Denies SOB, but has cough productive of thick green sputum - difficult to get up.  Denies nausea, vomiting, abdominal pain.    Objective: Filed Vitals:   05/26/12 2150 05/26/12 2200 05/27/12 0505 05/27/12 0823  BP:  141/53 140/47   Pulse:  63 66   Temp:  98.5 F (36.9 C) 99.7 F (37.6 C)   TempSrc:  Oral Oral   Resp:  20 18   Height:      Weight:   76.6 kg (168 lb 14 oz)   SpO2: 96% 98% 91% 92%    Intake/Output Summary (Last 24 hours) at 05/27/12 1122 Last data filed at 05/27/12 0700  Gross per 24 hour  Intake    660 ml  Output   1575 ml  Net   -915 ml   Filed Weights   05/24/12 1700 05/26/12 0500 05/27/12 0505  Weight: 80.287 kg (177 lb) 74.8 kg (164 lb 14.5 oz) 76.6 kg (168 lb 14 oz)    Exam:   General:  Caucasian male, no acute distress, frequent rhonchorous cough  HEENT:  MMM  Cardiovascular: RRR, 3/6 holosystolic murmur at the LSB  Respiratory:  Decreased rales bilateral bases.  Diminished BS at bases left greater than right.  + rhonchi.  No wheezes.  Abdomen: NABS, soft, nondistended, nontender, no organomealy  MSK:  trace LEE  Data Reviewed: Basic Metabolic Panel:  Lab 05/27/12 8119 05/26/12 0537 05/25/12 0445 05/24/12 1110  NA 131* 133* 132* 131*  K 3.9 3.6 3.9 3.9  CL 93* 96 93* 92*  CO2 28 28 28 27   GLUCOSE 126* 121* 105* 134*  BUN 33* 35* 45* 44*  CREATININE 1.44* 1.43* 1.55* 1.39*  CALCIUM 9.1 9.2 9.2 9.3  MG -- -- -- --  PHOS -- -- -- --   Liver Function Tests:  Lab 05/25/12 0445  AST 22  ALT 10  ALKPHOS 49  BILITOT 0.4  PROT 6.0  ALBUMIN 2.9*   No results found for this basename: LIPASE:5,AMYLASE:5 in the last 168 hours No results found for this basename: AMMONIA:5 in the last 168 hours CBC:  Lab 05/27/12 0545 05/26/12 0537 05/25/12 0445 05/24/12 1110  WBC 10.1 12.1* 15.9* 22.1*  NEUTROABS -- -- -- 19.3*  HGB 10.0* 9.9* 9.2* 10.4*  HCT 29.8* 29.3* 27.1* 30.1*  MCV 84.4 84.7 83.6 82.9  PLT 150 124* 128* 149*   Cardiac  Enzymes: No results found for this basename: CKTOTAL:5,CKMB:5,CKMBINDEX:5,TROPONINI:5 in the last 168 hours BNP (last 3 results)  Basename 05/25/12 0440 05/24/12 1115  PROBNP 3614.0* 2385.0*   CBG:  Lab 05/27/12 0738 05/26/12 2158 05/26/12 1640 05/26/12 1156 05/26/12 0747  GLUCAP 124* 99 142* 110* 129*    Recent Results (from the past 240 hour(s))  URINE CULTURE     Status: Normal   Collection Time   05/24/12 11:24 AM      Component Value Range Status Comment   Specimen Description URINE, CATHETERIZED   Final    Special Requests NONE   Final    Culture  Setup Time 05/24/2012 18:47   Final    Colony Count NO GROWTH   Final    Culture NO GROWTH   Final    Report Status 05/25/2012 FINAL   Final   MRSA PCR SCREENING     Status: Normal   Collection Time   05/25/12  2:16 AM      Component Value Range Status Comment   MRSA by PCR NEGATIVE  NEGATIVE Final      Studies: No results found.  Scheduled Meds:    . ipratropium  0.5 mg Nebulization TID   And  . albuterol  2.5 mg Nebulization TID  . aspirin EC  81 mg Oral QAC breakfast  . atorvastatin  40 mg Oral q1800  . calcium-vitamin D  2 tablet Oral Daily  . carvedilol  25 mg Oral BID WC  . docusate sodium  100 mg Oral BID  . doxazosin  4 mg Oral QHS  . ferrous sulfate  325 mg Oral Q breakfast  . finasteride  5 mg Oral QAC breakfast  . furosemide  40 mg Oral BID  . gabapentin  100 mg Oral TID  . heparin  5,000 Units Subcutaneous Q8H  . insulin aspart  0-5 Units Subcutaneous QHS  . insulin aspart  0-9 Units Subcutaneous TID WC  . levofloxacin (LEVAQUIN) IV  250 mg Intravenous Q24H  . losartan  100 mg Oral QAC breakfast  . mirtazapine  30 mg Oral QHS  . multivitamin with minerals  1 tablet Oral QAC breakfast  . pantoprazole  40 mg Oral Daily  . polyethylene glycol  17 g Oral QAC breakfast  . sodium chloride  3 mL Intravenous Q12H  . sodium chloride  3 mL Intravenous Q12H  . vitamin B-12  1,000 mcg Oral QAC breakfast  .  vitamin C  500 mg Oral QAC breakfast   Continuous Infusions:   Active Problems:  CAP (community acquired pneumonia)  CHF (congestive heart failure)  PVD (peripheral vascular disease)  CAD (coronary  artery disease)  CKD (chronic kidney disease) stage 3, GFR 30-59 ml/min  Diabetes mellitus type II, controlled  HTN (hypertension)  Leukocytosis  Anemia  Hyponatremia  CHF, acute on chronic    Time spent: 30    Luciana Cammarata, [facility]  [facility] Pager [phone]. If 8PM-8AM, please contact night-coverage at [email or web address], password [facility] 05/27/2012, 11:22 AM  LOS: 3 days

## 2012-05-28 LAB — CBC
MCH: 28.5 pg (ref 26.0–34.0)
MCV: 83.1 fL (ref 78.0–100.0)
Platelets: 148 10*3/uL — ABNORMAL LOW (ref 150–400)
RBC: 3.55 MIL/uL — ABNORMAL LOW (ref 4.22–5.81)
RDW: 13.6 % (ref 11.5–15.5)

## 2012-05-28 LAB — BASIC METABOLIC PANEL
Calcium: 9 mg/dL (ref 8.4–10.5)
Creatinine, Ser: 1.43 mg/dL — ABNORMAL HIGH (ref 0.50–1.35)
GFR calc Af Amer: 49 mL/min — ABNORMAL LOW (ref >90)
GFR calc non Af Amer: 42 mL/min — ABNORMAL LOW (ref >90)
Sodium: 130 mEq/L — ABNORMAL LOW (ref 135–145)

## 2012-05-28 LAB — GLUCOSE, CAPILLARY: Glucose-Capillary: 81 mg/dL (ref 70–99)

## 2012-05-28 MED ORDER — MAGNESIUM HYDROXIDE 400 MG/5ML PO SUSP
30.0000 mL | Freq: Once | ORAL | Status: AC
  Administered 2012-05-28: 30 mL via ORAL
  Filled 2012-05-28: qty 30

## 2012-05-28 MED ORDER — BISACODYL 10 MG RE SUPP: 10.0000 mg | Freq: Once | RECTAL | Status: DC

## 2012-05-28 NOTE — Progress Notes (Signed)
Clinical Social Work Department BRIEF PSYCHOSOCIAL ASSESSMENT 05/28/2012  Patient:  JOHANTHAN, KNEELAND     Account Number:  [id number]     Admit date:  05/24/2012  Clinical Social Worker:  Doroteo Glassman  Date/Time:  05/28/2012 03:57 PM  Referred by:  Physician  Date Referred:  05/28/2012 Referred for  SNF Placement   Other Referral:   Interview type:  Other - See comment Other interview type:   Pt's wife    PSYCHOSOCIAL DATA Living Status:  WIFE Admitted from facility:   Level of care:   Primary support name:  Mrs. Goehring Primary support relationship to patient:  SPOUSE Degree of support available:   adequate    CURRENT CONCERNS Current Concerns  Post-Acute Placement   Other Concerns:    SOCIAL WORK ASSESSMENT / PLAN Met with Pt to discuss d/c plans.    Pt unsure of d/c plans and asked CSW to contact his wife.    Spoke with Pt's wife.  Pt's wife stating that Pt will need SNF and gave CSW permission to send Pt's information to all of [facility], with the hopes of getting a bed offer from [facility].    CSW thanked Mrs. Bouillon for her time.   Assessment/plan status:  Psychosocial Support/Ongoing Assessment of Needs Other assessment/ plan:   Information/referral to community resources:   Will give with offers.    PATIENT'S/FAMILY'S RESPONSE TO PLAN OF CARE: Mrs. Mohamed thanked CSW for time and assistance.   CSW to continue to follow.  Providence Crosby, LCSWA Clinical Social Work [phone]

## 2012-05-28 NOTE — Progress Notes (Addendum)
[facility] PROGRESS NOTE  [name] XLK:[id number] DOB: 06/21/1921 DOA: 05/24/2012 PCP: Londell Moh, MD  Assessment/Plan:  1. CAP (community acquired pneumonia):  Patient on room air but still quite weak.  WBC trending down, and fever improving.     -  Continue levofloxacin day 5 -  Blood culture x 2 NGTD -  Sputum culture pending 2. CHF (congestive heart failure), acute on chronic:  ProBNP 2385.0 and CXR demonstrated pulmonary edema. Patient was negative yesterday.  Less lower extremity edema again today.   -  Continue lasix at current dose 3. PVD (peripheral vascular disease): Patient is sl/p fem-pop bypass in the past. He does not have symptoms of claudication at this timer, and LE appear well perfused.  -  Will need appointment with Dr Early rescheduled 4. CAD (coronary artery disease): Stable/asymptomatic.  5. CKD (chronic kidney disease), stage III:  Creatinine stable today.  Patient is currently under care of Dr Camille Bal, nephrologist.  Trend 5. Diabetes mellitus type II, controlled:  A1c 5.4.   6. HTN (hypertension): low normal BP.  Trend 7.  Neuropathy/foot pain: Continue low dose gabapentin  8.  Urinary retention, chronic problem secondary to BPH:  Patient voiding without difficulty 9.  Hyponatremia, trending down slightly:  May be due to SIADH from pneumonia.  Trending down 10.  Constipation:  Continue miralax, colace and senna.  Add milk of magnesia 11.  Anemia of chronic disease, hemoglobin stable around 10.   Thrombocytopenia, mild.  No signs of bleeding.  Trend  DIET:  Diabetic, dysphagia 1 diet with thin liquids ACCESS:  PIV IVF:  None PROPH:  heparin  Code Status: full code Family Communication:  Spoke with patient who was alone today Disposition Plan:  To SNF on Monday, Social work consult placed.  Family would prefer [facility] if possible.       Consultants:  none  Procedures:  none  Antibiotics:  Levofloxacin 12/4  >>  Imipenem 12/4 >> 12/5   HPI/Subjective:  Patient continues to feel very weak.  Denies SOB.  Continues to have cough productive of thick green sputum.  Denies nausea, vomiting, abdominal pain, but feels constipated.  No BM in 4 days.      Objective: Filed Vitals:   05/27/12 1435 05/27/12 2047 05/27/12 2256 05/28/12 0500  BP: 139/53 97/39 127/50 126/81  Pulse: 65 63  65  Temp: 98.5 F (36.9 C) 98.4 F (36.9 C)  98.5 F (36.9 C)  TempSrc: Oral Oral  Oral  Resp: 20 20  20   Height:      Weight:    77.701 kg (171 lb 4.8 oz)  SpO2: 95% 94%  98%    Intake/Output Summary (Last 24 hours) at 05/28/12 1023 Last data filed at 05/28/12 0900  Gross per 24 hour  Intake   1100 ml  Output   1425 ml  Net   -325 ml   Filed Weights   05/26/12 0500 05/27/12 0505 05/28/12 0500  Weight: 74.8 kg (164 lb 14.5 oz) 76.6 kg (168 lb 14 oz) 77.701 kg (171 lb 4.8 oz)    Exam:   General:  Caucasian male, no acute distress, frequent rhonchorous cough  HEENT:  MMM  Cardiovascular: RRR, 3/6 holosystolic murmur at the LSB  Respiratory:  Diminished BS at bases left greater than right.  + rhonchi.  No wheezes.  Abdomen: NABS, soft, nondistended, nontender, no organomealy  MSK:  trace LEE  Data Reviewed: Basic Metabolic Panel:  Lab 05/28/12 3664 05/27/12 0545  05/26/12 0537 05/25/12 0445 05/24/12 1110  NA 130* 131* 133* 132* 131*  K 4.2 3.9 3.6 3.9 3.9  CL 92* 93* 96 93* 92*  CO2 28 28 28 28 27   GLUCOSE 135* 126* 121* 105* 134*  BUN 35* 33* 35* 45* 44*  CREATININE 1.43* 1.44* 1.43* 1.55* 1.39*  CALCIUM 9.0 9.1 9.2 9.2 9.3  MG -- -- -- -- --  PHOS -- -- -- -- --   Liver Function Tests:  Lab 05/25/12 0445  AST 22  ALT 10  ALKPHOS 49  BILITOT 0.4  PROT 6.0  ALBUMIN 2.9*   No results found for this basename: LIPASE:5,AMYLASE:5 in the last 168 hours No results found for this basename: AMMONIA:5 in the last 168 hours CBC:  Lab 05/28/12 0630 05/27/12 0545 05/26/12 0537 05/25/12  0445 05/24/12 1110  WBC 7.9 10.1 12.1* 15.9* 22.1*  NEUTROABS -- -- -- -- 19.3*  HGB 10.1* 10.0* 9.9* 9.2* 10.4*  HCT 29.5* 29.8* 29.3* 27.1* 30.1*  MCV 83.1 84.4 84.7 83.6 82.9  PLT 148* 150 124* 128* 149*   Cardiac Enzymes: No results found for this basename: CKTOTAL:5,CKMB:5,CKMBINDEX:5,TROPONINI:5 in the last 168 hours BNP (last 3 results)  Basename 05/25/12 0440 05/24/12 1115  PROBNP 3614.0* 2385.0*   CBG:  Lab 05/28/12 0916 05/27/12 2308 05/27/12 1656 05/27/12 1215 05/27/12 0738  GLUCAP 191* 118* 106* 116* 124*    Recent Results (from the past 240 hour(s))  URINE CULTURE     Status: Normal   Collection Time   05/24/12 11:24 AM      Component Value Range Status Comment   Specimen Description URINE, CATHETERIZED   Final    Special Requests NONE   Final    Culture  Setup Time 05/24/2012 18:47   Final    Colony Count NO GROWTH   Final    Culture NO GROWTH   Final    Report Status 05/25/2012 FINAL   Final   MRSA PCR SCREENING     Status: Normal   Collection Time   05/25/12  2:16 AM      Component Value Range Status Comment   MRSA by PCR NEGATIVE  NEGATIVE Final   CULTURE, BLOOD (ROUTINE X 2)     Status: Normal (Preliminary result)   Collection Time   05/26/12  2:45 PM      Component Value Range Status Comment   Specimen Description BLOOD LEFT ARM   Final    Special Requests BOTTLES DRAWN AEROBIC AND ANAEROBIC 5CC   Final    Culture  Setup Time 05/26/2012 18:17   Final    Culture     Final    Value:        BLOOD CULTURE RECEIVED NO GROWTH TO DATE CULTURE WILL BE HELD FOR 5 DAYS BEFORE ISSUING A FINAL NEGATIVE REPORT   Report Status PENDING   Incomplete   CULTURE, BLOOD (ROUTINE X 2)     Status: Normal (Preliminary result)   Collection Time   05/26/12  2:47 PM      Component Value Range Status Comment   Specimen Description BLOOD LEFT HAND   Final    Special Requests BOTTLES DRAWN AEROBIC ONLY 3CC   Final    Culture  Setup Time 05/26/2012 18:17   Final    Culture      Final    Value:        BLOOD CULTURE RECEIVED NO GROWTH TO DATE CULTURE WILL BE HELD FOR 5 DAYS BEFORE ISSUING  A FINAL NEGATIVE REPORT   Report Status PENDING   Incomplete   CULTURE, EXPECTORATED SPUTUM-ASSESSMENT     Status: Normal   Collection Time   05/27/12 10:59 AM      Component Value Range Status Comment   Specimen Description SPUTUM   Final    Special Requests Normal   Final    Sputum evaluation     Final    Value: THIS SPECIMEN IS ACCEPTABLE. RESPIRATORY CULTURE REPORT TO FOLLOW.   Report Status 05/27/2012 FINAL   Final      Studies: No results found.  Scheduled Meds:    . albuterol  2.5 mg Nebulization BID  . aspirin EC  81 mg Oral QAC breakfast  . atorvastatin  40 mg Oral q1800  . calcium-vitamin D  2 tablet Oral Daily  . carvedilol  25 mg Oral BID WC  . docusate sodium  100 mg Oral BID  . doxazosin  4 mg Oral QHS  . ferrous sulfate  325 mg Oral Q breakfast  . finasteride  5 mg Oral QAC breakfast  . furosemide  40 mg Oral BID  . gabapentin  100 mg Oral TID  . heparin  5,000 Units Subcutaneous Q8H  . insulin aspart  0-5 Units Subcutaneous QHS  . insulin aspart  0-9 Units Subcutaneous TID WC  . levofloxacin (LEVAQUIN) IV  250 mg Intravenous Q24H  . losartan  100 mg Oral QAC breakfast  . mirtazapine  30 mg Oral QHS  . multivitamin with minerals  1 tablet Oral QAC breakfast  . pantoprazole  40 mg Oral Daily  . polyethylene glycol  17 g Oral QAC breakfast  . sodium chloride  3 mL Intravenous Q12H  . sodium chloride  3 mL Intravenous Q12H  . vitamin B-12  1,000 mcg Oral QAC breakfast  . vitamin C  500 mg Oral QAC breakfast  . [DISCONTINUED] albuterol  2.5 mg Nebulization TID  . [DISCONTINUED] ipratropium  0.5 mg Nebulization TID   Continuous Infusions:   Active Problems:  CAP (community acquired pneumonia)  CHF (congestive heart failure)  PVD (peripheral vascular disease)  CAD (coronary artery disease)  CKD (chronic kidney disease) stage 3, GFR 30-59 ml/min   Diabetes mellitus type II, controlled  HTN (hypertension)  Leukocytosis  Anemia  Hyponatremia  CHF, acute on chronic    Time spent: 30    Malone Vanblarcom, [facility]  [facility] Pager [phone]. If 8PM-8AM, please contact night-coverage at [email or web address], password [facility] 05/28/2012, 10:23 AM  LOS: 4 days

## 2012-05-28 NOTE — Progress Notes (Addendum)
Clinical Social Work Department CLINICAL SOCIAL WORK PLACEMENT NOTE 05/28/2012  Patient:  KEEGEN, HEFFERN  Account Number:  [id number] Admit date:  05/24/2012  Clinical Social Worker:  Doroteo Glassman  Date/time:  05/28/2012 04:15 PM  Clinical Social Work is seeking post-discharge placement for this patient at the following level of care:   SKILLED NURSING   (*CSW will update this form in Epic as items are completed)   Will give with bed offers  Patient/family provided with [facility] Department of Clinical Social Work's list of facilities offering this level of care within the geographic area requested by the patient (or if unable, by the patient's family).  05/28/12  Patient/family informed of their freedom to choose among providers that offer the needed level of care, that participate in Medicare, Medicaid or managed care program needed by the patient, have an available bed and are willing to accept the patient.  05/28/12  Patient/family informed of [facility]' ownership interest in [facility], as well as of the fact that they are under no obligation to receive care at this facility.  PASARR submitted to [employer] on 05/28/2012 PASARR number received from [employer] on 05/28/2012  FL2 transmitted to all facilities in geographic area requested by pt/family on  05/28/2012 FL2 transmitted to all facilities within larger geographic area on   Patient informed that his/her managed care company has contracts with or will negotiate with  certain facilities, including the following:     Patient/family informed of bed offers received:  05/29/2012 Patient chooses bed at [facility] Physician recommends and patient chooses bed at    Patient to be transferred to  on  [facility] on 05/29/2012 Patient to be transferred to facility by pt wife via private vehicle  The following physician request were entered in Epic:   Additional Comments:  CSW to continue to  follow.  Providence Crosby, LCSWA Clinical Social Work [phone]

## 2012-05-29 LAB — BASIC METABOLIC PANEL
Chloride: 92 mEq/L — ABNORMAL LOW (ref 96–112)
GFR calc non Af Amer: 39 mL/min — ABNORMAL LOW (ref >90)
Glucose, Bld: 110 mg/dL — ABNORMAL HIGH (ref 70–99)
Potassium: 4.2 mEq/L (ref 3.5–5.1)
Sodium: 131 mEq/L — ABNORMAL LOW (ref 135–145)

## 2012-05-29 LAB — CBC
HCT: 30.5 % — ABNORMAL LOW (ref 39.0–52.0)
Hemoglobin: 10.2 g/dL — ABNORMAL LOW (ref 13.0–17.0)
MCHC: 33.4 g/dL (ref 30.0–36.0)
RBC: 3.65 MIL/uL — ABNORMAL LOW (ref 4.22–5.81)
WBC: 7.9 10*3/uL (ref 4.0–10.5)

## 2012-05-29 LAB — CULTURE, RESPIRATORY W GRAM STAIN: Culture: NORMAL

## 2012-05-29 MED ORDER — DSS 100 MG PO CAPS: 100.0000 mg | ORAL_CAPSULE | Freq: Two times a day (BID) | ORAL | Status: DC

## 2012-05-29 MED ORDER — SENNA 8.6 MG PO TABS: 1.0000 | ORAL_TABLET | Freq: Every day | ORAL | Status: DC | PRN

## 2012-05-29 MED ORDER — GUAIFENESIN ER 600 MG PO TB12: 1200.0000 mg | ORAL_TABLET | Freq: Two times a day (BID) | ORAL | Status: DC

## 2012-05-29 MED ORDER — GABAPENTIN 100 MG PO CAPS: 100.0000 mg | ORAL_CAPSULE | Freq: Three times a day (TID) | ORAL | Status: DC

## 2012-05-29 MED ORDER — LEVOFLOXACIN 250 MG PO TABS: 250.0000 mg | ORAL_TABLET | Freq: Every day | ORAL | Status: AC

## 2012-05-29 MED ORDER — FINASTERIDE 5 MG PO TABS: 5.0000 mg | ORAL_TABLET | Freq: Every day | ORAL | Status: DC

## 2012-05-29 MED ORDER — BISACODYL 10 MG RE SUPP: 10.0000 mg | Freq: Every day | RECTAL | Status: DC | PRN

## 2012-05-29 MED ORDER — BENZONATATE 100 MG PO CAPS: 100.0000 mg | ORAL_CAPSULE | Freq: Three times a day (TID) | ORAL | Status: DC | PRN

## 2012-05-29 MED ORDER — ALBUTEROL SULFATE HFA 108 (90 BASE) MCG/ACT IN AERS: 2.0000 | INHALATION_SPRAY | Freq: Four times a day (QID) | RESPIRATORY_TRACT | Status: DC | PRN

## 2012-05-29 MED ORDER — OXYCODONE-ACETAMINOPHEN 5-325 MG PO TABS: 1.0000 | ORAL_TABLET | ORAL | Status: DC | PRN

## 2012-05-29 NOTE — Discharge Summary (Addendum)
Physician Discharge Summary  Erie Radu ZOX:[id number] DOB: [birth year] DOA: 05/24/2012  PCP: Londell Moh, MD  Admit date: 05/24/2012 Discharge date: 05/29/2012  Recommendations for Outpatient Follow-up:  1. To SNF for ongoing PT/OT 2. Levofloxacin to continue until 12/11 3. Indwelling foley to remain in until patient can see his urologist or two weeks, whichever comes sooner.  Please attempt voiding trial on 12/23 if patient still hasn't been able to see his urologist.   4. Consider increasing doxazosin as outpatient.  Do not want to make change on date of discharge as blood pressure has been on low-normal side and patient at risk of falls.   5. F/u with PCP in 2 weeks or sooner as needed.  Repeat BMP and CBC to evaluate hyponatremia, kidney function, and level of anemia.   6. Discuss if gabapentin dose needs adjustment  Discharge Diagnoses:  Active Problems:  CAP (community acquired pneumonia)  CHF (congestive heart failure)  PVD (peripheral vascular disease)  CAD (coronary artery disease)  CKD (chronic kidney disease) stage 3, GFR 30-59 ml/min  Diabetes mellitus type II, controlled  HTN (hypertension)  Leukocytosis  Anemia  Hyponatremia  CHF, acute on chronic   Discharge Condition: stable, improved  " Diet recommendation: healthy heart, diabetic diet dysphagia 1 with thin liquids Liquid Administration via: Cup;Straw  Medication Administration: Crushed with puree  Supervision: Patient able to self feed  Compensations: Slow rate;Small sips/bites  Postural Changes and/or Swallow Maneuvers: Seated upright 90 degrees;Upright 30-60 min after meal "  **Please CRUSH pills and put in yogurt**  Wt Readings from Last 3 Encounters:  05/29/12 77.565 kg (171 lb)  11/05/11 72.122 kg (159 lb)  08/13/11 78.472 kg (173 lb)    History of present illness:   This is an 78 year old male, with known history of DM-2, HTN, CAD, s/p MI 1974, CHF, PVD, dyslipidemia, GERD, OA, s/p  back surgery x 2, s/p right carpal tunnel surgery, s/p hemorrhoidectomy, BPH/obstructive uropathy, s/p prostate surgery, PVD, s/p revascularization, bilateral LE, s/p bilateral renal artery stents, CKD. According to patient and spouse, who accompanied him to ED, patient has had a productive cough with yellowish/brownish phlegm in the pas 3-4 weeks, which appeared some what improved with OTC Mucinex. Today, he was scheduled to see Dr Tawanna Cooler early, vascular surgeon, on a routine visit, but was so weak that he was unable to get out of bed. He spouse called EMS. Patient denies chest pain or SOB, denies fever or chills, denies recent sick contacts. Patient has had trouble with dysphagia in the past, and is on a pureed diet.   Hospital Course:   CAP (community acquired pneumonia):  Patient was started on levofloxacin and imipenem initially.  After 24 hours of clinical improvement his imipenem was discontinued and he continued levofloxacin monotherapy.  His WBC count and his temperature trended down.  He was weaned to room air from nasal canula.  Blood cultures are NGTD.  Sputum culture grew OP flora.  He underwent a swallow evaluation because of concern that he may have had some aspiration, however, it confirmed that his home dysphagia 1 diet with thin liquids was adequate.  He should complete a total of 7 days of antibiotics, last day on 12/11.    2. CHF (congestive heart failure), acute on chronic: ProBNP 2385.0 and CXR demonstrated pulmonary edema.  He was continued on his home lasix and had several liters of urine output for the first two days with decreased lower extremity edema.  His diuresis tapered  somewhat despite stable dose of diuretic over the two days prior to discharge.  Asked if he had been compliant with his lasix because his response was so brisk during the first couple of days of admission, but patient and wife stated that he had been.    3. PVD (peripheral vascular disease): Patient is sl/p  fem-pop bypass in the past. He does not have symptoms of claudication at this timer, and LE appear well perfused.  Family to reschedule appointment with Dr Arbie Cookey.   4. CAD (coronary artery disease): Stable/asymptomatic.  5. CKD (chronic kidney disease), stage III: Creatinine stable around 1.4 to 1.5mg /dl. Patient is currently under care of Dr Camille Bal, nephrologist.  5. Diabetes mellitus type II, controlled: A1c 5.4.  6. HTN (hypertension): low normal BP. 7. Neuropathy/foot pain: he was started on low dose gabapentin which decreased his foot pain considerable so did not titrate up dose.   8. Urinary retention, chronic problem secondary to BPH:  He initially had a foley placed due to urinary retention in the emergency department.  He has previously needed home foley, however, on day 2 of admission, his foley was discontinued and he had minimal post void residual without difficulty voiding until the day of discharge.  On 12/9, he developed difficulty urinating again and PVR demonstrated >716ml of retained urine.  Placed foley catheter and patient to follow up with urologist in 1-2 weeks.  If unable to get appointment with his urologist, consider repeat voiding trial in 1-2 weeks.  Increased his doxazosin to 8mg .   9. Hyponatremia, trending down slightly: May be due to SIADH from pneumonia versus reset osmostat.  Patient is elderly and at risk for dehydration and is currently asymptomatic so will not fluid restrict.  10. Constipation: Added miralax, colace, senna and milk of magnesia prn 11. Anemia of chronic disease, hemoglobin stable around 10.  Defer work up to PCP if not already complete. 12. Thrombocytopenia, mild. No signs of bleeding and trended up to normal.     Consultants:  none Procedures:  Foley catheter placement Antibiotics:  Levofloxacin 12/4 >> 12/11 Imipenem 12/4 >> 12/5   Discharge Exam: Filed Vitals:   05/29/12 1256  BP: 111/49  Pulse: 62  Temp: 97.9 F (36.6 C)   Resp: 18   Filed Vitals:   05/29/12 0500 05/29/12 0634 05/29/12 0823 05/29/12 1256  BP:  148/49 168/69 111/49  Pulse:  59 66 62  Temp:  98.4 F (36.9 C)  97.9 F (36.6 C)  TempSrc:  Oral  Oral  Resp:  18  18  Height:      Weight: 77.565 kg (171 lb)     SpO2:  94%  98%   General: Caucasian male, no acute distress, rhonchorous cough  HEENT: MMM  Cardiovascular: RRR, 3/6 holosystolic murmur at the LSB  Respiratory: Diminished BS at bases left with rhonchi and some rales.  Otherwise clear.  No wheezes.  Abdomen:  NABS, soft, nondistended, nontender, no organomealy  MSK: trace LEE  Discharge Instructions      Discharge Orders    Future Orders Please Complete By Expires   Diet - low sodium heart healthy      Diet Carb Modified      Increase activity slowly      Discharge instructions      Comments:   You were hospitalized with pneumonia and urinary retention.  You were started on an antibiotic called levofloxacin, which you should continue to take through 12/11 to complete a  full 7-day course.  Please continue using mucinex and try tessalon for cough.  You may use an albuterol inhaler.  Because you continue to retain urine, you will be discharged with the urine catheter in place.  Please follow up with your urologist in 1-2 weeks if possible.  Increasing your doxazosin may help your symptoms, but increasing it may cause a drop in your blood pressure, which I do not want to risk at this time without close monitoring. Talk to your primary care doctor or urologist about this.  You will need to have repeat blood work done by your primary care doctor at your next appointment within two weeks.  Finally for your foot pain, you were started on gabapentin which seemed to help. Talk to your primary care doctor about increasing the dose if needed.   Call MD for:  temperature >100.4      Call MD for:  persistant nausea and vomiting      Call MD for:  severe uncontrolled pain      Call MD for:   difficulty breathing, headache or visual disturbances      Call MD for:  hives      Call MD for:  persistant dizziness or light-headedness      Call MD for:  extreme fatigue      (HEART FAILURE PATIENTS) Call MD:  Anytime you have any of the following symptoms: 1) 3 pound weight gain in 24 hours or 5 pounds in 1 week 2) shortness of breath, with or without a dry hacking cough 3) swelling in the hands, feet or stomach 4) if you have to sleep on extra pillows at night in order to breathe.          Medication List     As of 05/29/2012  3:24 PM    TAKE these medications         albuterol 108 (90 BASE) MCG/ACT inhaler   Commonly known as: PROVENTIL HFA;VENTOLIN HFA   Inhale 2 puffs into the lungs every 6 (six) hours as needed for wheezing.      aspirin EC 81 MG tablet   Take 81 mg by mouth daily before breakfast.      benzonatate 100 MG capsule   Commonly known as: TESSALON   Take 1 capsule (100 mg total) by mouth 3 (three) times daily as needed for cough.      bisacodyl 10 MG suppository   Commonly known as: DULCOLAX   Place 1 suppository (10 mg total) rectally daily as needed for constipation.      calcium-vitamin D 500-200 MG-UNIT per tablet   Commonly known as: OSCAL WITH D   Take 2 tablets by mouth daily.      carvedilol 25 MG tablet   Commonly known as: COREG   Take 25 mg by mouth 2 (two) times daily with a meal.      desoximetasone 0.25 % cream   Commonly known as: TOPICORT   Apply 1 application topically 2 (two) times daily as needed. For legs (itching)      doxazosin 4 MG tablet   Commonly known as: CARDURA   Take 4 mg by mouth at bedtime.      DSS 100 MG Caps   Take 100 mg by mouth 2 (two) times daily.      ferrous sulfate 325 (65 FE) MG tablet   Take 325 mg by mouth daily with breakfast.      finasteride 5 MG tablet  Commonly known as: PROSCAR   Take 1 tablet (5 mg total) by mouth daily before breakfast.      furosemide 40 MG tablet   Commonly known as:  LASIX   Take 40 mg by mouth 2 (two) times daily.      gabapentin 100 MG capsule   Commonly known as: NEURONTIN   Take 1 capsule (100 mg total) by mouth 3 (three) times daily.      guaiFENesin 600 MG 12 hr tablet   Commonly known as: MUCINEX   Take 2 tablets (1,200 mg total) by mouth 2 (two) times daily.      levofloxacin 250 MG tablet   Commonly known as: LEVAQUIN   Take 1 tablet (250 mg total) by mouth daily.      losartan 100 MG tablet   Commonly known as: COZAAR   Take 100 mg by mouth daily before breakfast.      mirtazapine 30 MG tablet   Commonly known as: REMERON   Take 30 mg by mouth at bedtime.      multivitamin with minerals Tabs   Take 1 tablet by mouth daily before breakfast.      oxyCODONE-acetaminophen 5-325 MG per tablet   Commonly known as: PERCOCET/ROXICET   Take 1 tablet by mouth every 4 (four) hours as needed for pain. Hip pain      polyethylene glycol packet   Commonly known as: MIRALAX / GLYCOLAX   Take 17 g by mouth daily before breakfast.      PRILOSEC PO   Take 15 mg by mouth daily.      rosuvastatin 20 MG tablet   Commonly known as: CRESTOR   Take 20 mg by mouth at bedtime.      senna 8.6 MG Tabs   Commonly known as: SENOKOT   Take 1 tablet (8.6 mg total) by mouth daily as needed.      sitaGLIPtin 50 MG tablet   Commonly known as: JANUVIA   Take 50 mg by mouth daily before breakfast.      vitamin B-12 1000 MCG tablet   Commonly known as: CYANOCOBALAMIN   Take 1,000 mcg by mouth daily before breakfast.      vitamin C 500 MG tablet   Commonly known as: ASCORBIC ACID   Take 500 mg by mouth daily before breakfast.      zaleplon 5 MG capsule   Commonly known as: SONATA   Take 5 mg by mouth at bedtime.        Follow-up Information    Follow up with Londell Moh, MD. In 2 weeks.   Contact information:   [address] [place] [address] [phone]       Follow up with Anner Crete, MD. In 2 weeks.    Contact information:   [address] [place] [address] [phone]       Schedule an appointment as soon as possible for a visit with DUNHAM,CYNTHIA B, MD. (As needed)    Contact information:   [address] [facility] [place] [address] [phone]           The results of significant diagnostics from this hospitalization (including imaging, microbiology, ancillary and laboratory) are listed below for reference.    Significant Diagnostic Studies: Dg Chest 2 View  05/24/2012  *RADIOLOGY REPORT*  Clinical Data: Urinary retention.  Hypertension.  History of smoking.  CHEST - 2 VIEW  Comparison: Chest CT 03/15/2012 and chest  x-ray 08/09/2011  Findings: The heart is mildly enlarged but stable.  There is tortuosity and calcification of the thoracic aorta.  There is an underlying diffuse interstitial process with superimposed airspace opacities.  Findings could be due to asymmetric pulmonary edema or underlying bronchitis and superimposed pneumonia.  No pleural effusion.  The bony thorax is intact.  IMPRESSION:  Probable severe bronchitis with superimposed right lung infiltrates.  Asymmetric pulmonary edema is also possible.   Original Report Authenticated By: Rudie Meyer, M.D.     Microbiology: Recent Results (from the past 240 hour(s))  URINE CULTURE     Status: Normal   Collection Time   05/24/12 11:24 AM      Component Value Range Status Comment   Specimen Description URINE, CATHETERIZED   Final    Special Requests NONE   Final    Culture  Setup Time 05/24/2012 18:47   Final    Colony Count NO GROWTH   Final    Culture NO GROWTH   Final    Report Status 05/25/2012 FINAL   Final   MRSA PCR SCREENING     Status: Normal   Collection Time   05/25/12  2:16 AM      Component Value Range Status Comment   MRSA by PCR NEGATIVE  NEGATIVE Final   CULTURE, BLOOD (ROUTINE X 2)     Status: Normal (Preliminary result)   Collection Time   05/26/12  2:45  PM      Component Value Range Status Comment   Specimen Description BLOOD LEFT ARM   Final    Special Requests BOTTLES DRAWN AEROBIC AND ANAEROBIC 5CC   Final    Culture  Setup Time 05/26/2012 18:17   Final    Culture     Final    Value:        BLOOD CULTURE RECEIVED NO GROWTH TO DATE CULTURE WILL BE HELD FOR 5 DAYS BEFORE ISSUING A FINAL NEGATIVE REPORT   Report Status PENDING   Incomplete   CULTURE, BLOOD (ROUTINE X 2)     Status: Normal (Preliminary result)   Collection Time   05/26/12  2:47 PM      Component Value Range Status Comment   Specimen Description BLOOD LEFT HAND   Final    Special Requests BOTTLES DRAWN AEROBIC ONLY 3CC   Final    Culture  Setup Time 05/26/2012 18:17   Final    Culture     Final    Value:        BLOOD CULTURE RECEIVED NO GROWTH TO DATE CULTURE WILL BE HELD FOR 5 DAYS BEFORE ISSUING A FINAL NEGATIVE REPORT   Report Status PENDING   Incomplete   CULTURE, EXPECTORATED SPUTUM-ASSESSMENT     Status: Normal   Collection Time   05/27/12 10:59 AM      Component Value Range Status Comment   Specimen Description SPUTUM   Final    Special Requests Normal   Final    Sputum evaluation     Final    Value: THIS SPECIMEN IS ACCEPTABLE. RESPIRATORY CULTURE REPORT TO FOLLOW.   Report Status 05/27/2012 FINAL   Final   CULTURE, RESPIRATORY     Status: Normal   Collection Time   05/27/12 10:59 AM      Component Value Range Status Comment   Specimen Description SPUTUM   Final    Special Requests NONE   Final    Gram Stain     Final  Value: FEW WBC PRESENT,BOTH PMN AND MONONUCLEAR     RARE SQUAMOUS EPITHELIAL CELLS PRESENT     FEW GRAM POSITIVE COCCI IN PAIRS     FEW GRAM POSITIVE RODS   Culture NORMAL OROPHARYNGEAL FLORA   Final    Report Status 05/29/2012 FINAL   Final      Labs: Basic Metabolic Panel:  Lab 05/29/12 4782 05/28/12 0630 05/27/12 0545 05/26/12 0537 05/25/12 0445  NA 131* 130* 131* 133* 132*  K 4.2 4.2 3.9 3.6 3.9  CL 92* 92* 93* 96 93*  CO2 30  28 28 28 28   GLUCOSE 110* 135* 126* 121* 105*  BUN 38* 35* 33* 35* 45*  CREATININE 1.51* 1.43* 1.44* 1.43* 1.55*  CALCIUM 9.2 9.0 9.1 9.2 9.2  MG -- -- -- -- --  PHOS -- -- -- -- --   Liver Function Tests:  Lab 05/25/12 0445  AST 22  ALT 10  ALKPHOS 49  BILITOT 0.4  PROT 6.0  ALBUMIN 2.9*   No results found for this basename: LIPASE:5,AMYLASE:5 in the last 168 hours No results found for this basename: AMMONIA:5 in the last 168 hours CBC:  Lab 05/29/12 0508 05/28/12 0630 05/27/12 0545 05/26/12 0537 05/25/12 0445 05/24/12 1110  WBC 7.9 7.9 10.1 12.1* 15.9* --  NEUTROABS -- -- -- -- -- 19.3*  HGB 10.2* 10.1* 10.0* 9.9* 9.2* --  HCT 30.5* 29.5* 29.8* 29.3* 27.1* --  MCV 83.6 83.1 84.4 84.7 83.6 --  PLT 154 148* 150 124* 128* --   Cardiac Enzymes: No results found for this basename: CKTOTAL:5,CKMB:5,CKMBINDEX:5,TROPONINI:5 in the last 168 hours BNP: BNP (last 3 results)  Basename 05/25/12 0440 05/24/12 1115  PROBNP 3614.0* 2385.0*   CBG:  Lab 05/29/12 1153 05/29/12 0720 05/28/12 2215 05/28/12 1653 05/28/12 1310  GLUCAP 113* 114* 97 97 81    Time coordinating discharge: 45 minutes  Signed:  Kalei Meda  [facility] 05/29/2012, 3:24 PM

## 2012-05-29 NOTE — Progress Notes (Signed)
Clinical Social Worker received notification from MD that pt medically stable for discharge. Clinical Social Worker met with pt and pt wife at bedside and provided bed offers. Pt and pt wife choose bed at [facility]. Pt wife requesting to transport pt via car and MD feels that this will be appropriate. Clinical Social Worker spoke to [facility] who confirmed bed availability for today.   Clinical Social Worker facilitated pt discharge needs including contacting facility, faxing pt discharge information via [facility], providing contact information to RN to call report, and providing discharge packet and directions to facility to pt wife at bedside. No further social work needs identified at this time. Clinical Social Worker signing off.   Jacklynn Lewis, MSW, LCSWA  Clinical Social Work [phone]

## 2012-05-29 NOTE — Progress Notes (Signed)
Pt had orders to measure PVR.  Bladder scan revealed >700 cc urine in pt's bladder. Notified Dr. Malachi Bonds and received new orders to insert foley catheter.  Immediately got 1000 cc in drainage bag.  Per MD, pt will go to SNF with foley. Called [facility] [place] and gave report to nurse Rosey Bath.  Pt's wife to transport pt to SNF. Pt in NAD.  PIV removed.  Reviewed  D/C instructions with pt and pt's wife and both demonstrated understanding.

## 2012-05-29 NOTE — Progress Notes (Deleted)
[facility] PROGRESS NOTE  [name] WUJ:[id number] DOB: 11-24-1920 DOA: 05/24/2012 PCP: Londell Moh, MD  Assessment/Plan:  1. CAP (community acquired pneumonia):  Patient on room air but still quite weak.  WBC trending down, and fever improving.  -  Continue levofloxacin day 6 -  Blood culture x 2 NGTD -  Sputum culture OP flora 2. CHF (congestive heart failure), -1L yesterday   -  Continue lasix at current dose 3. PVD (peripheral vascular disease): Patient is sl/p fem-pop bypass in the past. He does not have symptoms of claudication at this timer, and LE appear well perfused.  -  Will need appointment with Dr Early rescheduled 4. CAD (coronary artery disease): Stable/asymptomatic.  5. CKD (chronic kidney disease), stage III:  Creatinine stable.   5. Diabetes mellitus type II, controlled:  A1c 5.4.   6. HTN (hypertension): low normal BP.  Trend 7.  Neuropathy/foot pain: Continue low dose gabapentin  8.  Urinary retention, chronic problem secondary to BPH:  Patient voiding without difficulty 9.  Hyponatremia, trending down slightly:  May be due to SIADH from pneumonia vs. Reset osmostat 10.  Constipation, resolved:  Continue miralax, colace and senna, milk of magnesia 11.  Anemia of chronic disease, hemoglobin stable around 10.   Thrombocytopenia, mild.  Resolved.  DIET:  Diabetic, dysphagia 1 diet with thin liquids ACCESS:  PIV IVF:  None PROPH:  heparin  Code Status: full code Family Communication:  Spoke with patient and wife today Disposition Plan:  To SNF pending bed availability   Consultants:  none  Procedures:  none  Antibiotics:  Levofloxacin 12/4 >>  Imipenem 12/4 >> 12/5   HPI/Subjective:  Patient continues to feel very weak.  Denies SOB.  Continues to have cough productive of thick green sputum.  Denies nausea, vomiting, abdominal pain.  Constipation resolved.        Objective: Filed Vitals:   05/29/12 0500 05/29/12 0634 05/29/12  0823 05/29/12 1256  BP:  148/49 168/69 111/49  Pulse:  59 66 62  Temp:  98.4 F (36.9 C)  97.9 F (36.6 C)  TempSrc:  Oral  Oral  Resp:  18  18  Height:      Weight: 77.565 kg (171 lb)     SpO2:  94%  98%    Intake/Output Summary (Last 24 hours) at 05/29/12 1330 Last data filed at 05/29/12 1255  Gross per 24 hour  Intake    630 ml  Output   1600 ml  Net   -970 ml   Filed Weights   05/27/12 0505 05/28/12 0500 05/29/12 0500  Weight: 76.6 kg (168 lb 14 oz) 77.701 kg (171 lb 4.8 oz) 77.565 kg (171 lb)    Exam:  General: Caucasian male, no acute distress, rhonchorous cough HEENT: MMM Cardiovascular: RRR, 3/6 holosystolic murmur at the LSB Respiratory: Diminished BS at bases left with rhonchi and some rales. Otherwise clear. No wheezes. Abdomen: NABS, soft, nondistended, nontender, no organomealy MSK: trace LEE  Data Reviewed: Basic Metabolic Panel:  Lab 05/29/12 9562 05/28/12 0630 05/27/12 0545 05/26/12 0537 05/25/12 0445  NA 131* 130* 131* 133* 132*  K 4.2 4.2 3.9 3.6 3.9  CL 92* 92* 93* 96 93*  CO2 30 28 28 28 28   GLUCOSE 110* 135* 126* 121* 105*  BUN 38* 35* 33* 35* 45*  CREATININE 1.51* 1.43* 1.44* 1.43* 1.55*  CALCIUM 9.2 9.0 9.1 9.2 9.2  MG -- -- -- -- --  PHOS -- -- -- -- --  Liver Function Tests:  Lab 05/25/12 0445  AST 22  ALT 10  ALKPHOS 49  BILITOT 0.4  PROT 6.0  ALBUMIN 2.9*   No results found for this basename: LIPASE:5,AMYLASE:5 in the last 168 hours No results found for this basename: AMMONIA:5 in the last 168 hours CBC:  Lab 05/29/12 0508 05/28/12 0630 05/27/12 0545 05/26/12 0537 05/25/12 0445 05/24/12 1110  WBC 7.9 7.9 10.1 12.1* 15.9* --  NEUTROABS -- -- -- -- -- 19.3*  HGB 10.2* 10.1* 10.0* 9.9* 9.2* --  HCT 30.5* 29.5* 29.8* 29.3* 27.1* --  MCV 83.6 83.1 84.4 84.7 83.6 --  PLT 154 148* 150 124* 128* --   Cardiac Enzymes: No results found for this basename: CKTOTAL:5,CKMB:5,CKMBINDEX:5,TROPONINI:5 in the last 168 hours BNP (last 3  results)  Basename 05/25/12 0440 05/24/12 1115  PROBNP 3614.0* 2385.0*   CBG:  Lab 05/29/12 1153 05/29/12 0720 05/28/12 2215 05/28/12 1653 05/28/12 1310  GLUCAP 113* 114* 97 97 81    Recent Results (from the past 240 hour(s))  URINE CULTURE     Status: Normal   Collection Time   05/24/12 11:24 AM      Component Value Range Status Comment   Specimen Description URINE, CATHETERIZED   Final    Special Requests NONE   Final    Culture  Setup Time 05/24/2012 18:47   Final    Colony Count NO GROWTH   Final    Culture NO GROWTH   Final    Report Status 05/25/2012 FINAL   Final   MRSA PCR SCREENING     Status: Normal   Collection Time   05/25/12  2:16 AM      Component Value Range Status Comment   MRSA by PCR NEGATIVE  NEGATIVE Final   CULTURE, BLOOD (ROUTINE X 2)     Status: Normal (Preliminary result)   Collection Time   05/26/12  2:45 PM      Component Value Range Status Comment   Specimen Description BLOOD LEFT ARM   Final    Special Requests BOTTLES DRAWN AEROBIC AND ANAEROBIC 5CC   Final    Culture  Setup Time 05/26/2012 18:17   Final    Culture     Final    Value:        BLOOD CULTURE RECEIVED NO GROWTH TO DATE CULTURE WILL BE HELD FOR 5 DAYS BEFORE ISSUING A FINAL NEGATIVE REPORT   Report Status PENDING   Incomplete   CULTURE, BLOOD (ROUTINE X 2)     Status: Normal (Preliminary result)   Collection Time   05/26/12  2:47 PM      Component Value Range Status Comment   Specimen Description BLOOD LEFT HAND   Final    Special Requests BOTTLES DRAWN AEROBIC ONLY 3CC   Final    Culture  Setup Time 05/26/2012 18:17   Final    Culture     Final    Value:        BLOOD CULTURE RECEIVED NO GROWTH TO DATE CULTURE WILL BE HELD FOR 5 DAYS BEFORE ISSUING A FINAL NEGATIVE REPORT   Report Status PENDING   Incomplete   CULTURE, EXPECTORATED SPUTUM-ASSESSMENT     Status: Normal   Collection Time   05/27/12 10:59 AM      Component Value Range Status Comment   Specimen Description SPUTUM    Final    Special Requests Normal   Final    Sputum evaluation     Final  Value: THIS SPECIMEN IS ACCEPTABLE. RESPIRATORY CULTURE REPORT TO FOLLOW.   Report Status 05/27/2012 FINAL   Final   CULTURE, RESPIRATORY     Status: Normal   Collection Time   05/27/12 10:59 AM      Component Value Range Status Comment   Specimen Description SPUTUM   Final    Special Requests NONE   Final    Gram Stain     Final    Value: FEW WBC PRESENT,BOTH PMN AND MONONUCLEAR     RARE SQUAMOUS EPITHELIAL CELLS PRESENT     FEW GRAM POSITIVE COCCI IN PAIRS     FEW GRAM POSITIVE RODS   Culture NORMAL OROPHARYNGEAL FLORA   Final    Report Status 05/29/2012 FINAL   Final      Studies: No results found.  Scheduled Meds:    . albuterol  2.5 mg Nebulization BID  . aspirin EC  81 mg Oral QAC breakfast  . atorvastatin  40 mg Oral q1800  . bisacodyl  10 mg Rectal Once  . calcium-vitamin D  2 tablet Oral Daily  . carvedilol  25 mg Oral BID WC  . docusate sodium  100 mg Oral BID  . doxazosin  4 mg Oral QHS  . ferrous sulfate  325 mg Oral Q breakfast  . finasteride  5 mg Oral QAC breakfast  . furosemide  40 mg Oral BID  . gabapentin  100 mg Oral TID  . heparin  5,000 Units Subcutaneous Q8H  . insulin aspart  0-5 Units Subcutaneous QHS  . insulin aspart  0-9 Units Subcutaneous TID WC  . levofloxacin (LEVAQUIN) IV  250 mg Intravenous Q24H  . losartan  100 mg Oral QAC breakfast  . mirtazapine  30 mg Oral QHS  . multivitamin with minerals  1 tablet Oral QAC breakfast  . pantoprazole  40 mg Oral Daily  . polyethylene glycol  17 g Oral QAC breakfast  . sodium chloride  3 mL Intravenous Q12H  . sodium chloride  3 mL Intravenous Q12H  . vitamin B-12  1,000 mcg Oral QAC breakfast  . vitamin C  500 mg Oral QAC breakfast   Continuous Infusions:   Active Problems:  CAP (community acquired pneumonia)  CHF (congestive heart failure)  PVD (peripheral vascular disease)  CAD (coronary artery disease)  CKD  (chronic kidney disease) stage 3, GFR 30-59 ml/min  Diabetes mellitus type II, controlled  HTN (hypertension)  Leukocytosis  Anemia  Hyponatremia  CHF, acute on chronic    Time spent: 30    [name], [facility]  [facility] Pager [phone]. If 8PM-8AM, please contact night-coverage at [email or web address], password [facility] 05/29/2012, 1:30 PM  LOS: 5 days

## 2012-05-29 NOTE — Progress Notes (Signed)
Pt urinating small amounts and feels like he cannot completely empty his bladder when he voids. Bladder scan revealed ~700 cc urine in pt's bladder. Received new orders from MD to insert foley catheter and that pt would be d/c'd to SNF with foley.  Pt tolerated well.

## 2012-06-01 LAB — CULTURE, BLOOD (ROUTINE X 2): Culture: NO GROWTH

## 2012-07-18 DIAGNOSIS — G5600 Carpal tunnel syndrome, unspecified upper limb: Secondary | ICD-10-CM | POA: Insufficient documentation

## 2012-07-18 DIAGNOSIS — R269 Unspecified abnormalities of gait and mobility: Secondary | ICD-10-CM | POA: Insufficient documentation

## 2012-07-18 DIAGNOSIS — R6889 Other general symptoms and signs: Secondary | ICD-10-CM | POA: Insufficient documentation

## 2012-07-18 DIAGNOSIS — M48061 Spinal stenosis, lumbar region without neurogenic claudication: Secondary | ICD-10-CM | POA: Insufficient documentation

## 2012-07-18 DIAGNOSIS — D518 Other vitamin B12 deficiency anemias: Secondary | ICD-10-CM | POA: Insufficient documentation

## 2012-07-18 DIAGNOSIS — E1142 Type 2 diabetes mellitus with diabetic polyneuropathy: Secondary | ICD-10-CM | POA: Insufficient documentation

## 2012-08-24 ENCOUNTER — Encounter: Payer: Self-pay | Admitting: Neurosurgery

## 2012-08-25 ENCOUNTER — Ambulatory Visit: Payer: Medicare Other | Admitting: Neurosurgery

## 2012-09-08 ENCOUNTER — Ambulatory Visit: Payer: Medicare Other | Admitting: Neurosurgery

## 2012-09-14 ENCOUNTER — Other Ambulatory Visit: Payer: Self-pay

## 2012-09-14 MED ORDER — OXYCODONE-ACETAMINOPHEN 7.5-325 MG PO TABS: 1.0000 | ORAL_TABLET | Freq: Four times a day (QID) | ORAL | Status: DC | PRN

## 2012-09-14 NOTE — Telephone Encounter (Signed)
Spouse called requesting to pick up rx for Percocet

## 2012-09-18 ENCOUNTER — Other Ambulatory Visit: Payer: Self-pay | Admitting: Dermatology

## 2012-10-25 ENCOUNTER — Other Ambulatory Visit: Payer: Self-pay

## 2012-10-25 MED ORDER — OXYCODONE-ACETAMINOPHEN 7.5-325 MG PO TABS: 1.0000 | ORAL_TABLET | Freq: Four times a day (QID) | ORAL | Status: DC | PRN

## 2012-10-25 NOTE — Telephone Encounter (Signed)
Spouse called requesting refill on Percocet.  They will pick up Rx when it's ready.  Call back number [phone].

## 2012-10-28 ENCOUNTER — Emergency Department (HOSPITAL_COMMUNITY): Payer: Medicare Other

## 2012-10-28 ENCOUNTER — Emergency Department (HOSPITAL_COMMUNITY)
Admission: EM | Admit: 2012-10-28 | Discharge: 2012-10-28 | Discharge status: Against Medical Advice | Payer: Medicare Other | Attending: Emergency Medicine | Admitting: Emergency Medicine

## 2012-10-28 ENCOUNTER — Encounter (HOSPITAL_COMMUNITY): Payer: Self-pay | Admitting: *Deleted

## 2012-10-28 DIAGNOSIS — Z9889 Other specified postprocedural states: Secondary | ICD-10-CM | POA: Insufficient documentation

## 2012-10-28 DIAGNOSIS — Z8701 Personal history of pneumonia (recurrent): Secondary | ICD-10-CM | POA: Insufficient documentation

## 2012-10-28 DIAGNOSIS — E119 Type 2 diabetes mellitus without complications: Secondary | ICD-10-CM | POA: Insufficient documentation

## 2012-10-28 DIAGNOSIS — Z79899 Other long term (current) drug therapy: Secondary | ICD-10-CM | POA: Insufficient documentation

## 2012-10-28 DIAGNOSIS — R109 Unspecified abdominal pain: Secondary | ICD-10-CM | POA: Insufficient documentation

## 2012-10-28 DIAGNOSIS — Z87891 Personal history of nicotine dependence: Secondary | ICD-10-CM | POA: Insufficient documentation

## 2012-10-28 DIAGNOSIS — G8929 Other chronic pain: Secondary | ICD-10-CM | POA: Insufficient documentation

## 2012-10-28 DIAGNOSIS — I739 Peripheral vascular disease, unspecified: Secondary | ICD-10-CM | POA: Insufficient documentation

## 2012-10-28 DIAGNOSIS — Y9389 Activity, other specified: Secondary | ICD-10-CM | POA: Insufficient documentation

## 2012-10-28 DIAGNOSIS — Y9289 Other specified places as the place of occurrence of the external cause: Secondary | ICD-10-CM | POA: Insufficient documentation

## 2012-10-28 DIAGNOSIS — Z7982 Long term (current) use of aspirin: Secondary | ICD-10-CM | POA: Insufficient documentation

## 2012-10-28 DIAGNOSIS — IMO0002 Reserved for concepts with insufficient information to code with codable children: Secondary | ICD-10-CM | POA: Insufficient documentation

## 2012-10-28 DIAGNOSIS — K219 Gastro-esophageal reflux disease without esophagitis: Secondary | ICD-10-CM | POA: Insufficient documentation

## 2012-10-28 DIAGNOSIS — R339 Retention of urine, unspecified: Secondary | ICD-10-CM | POA: Insufficient documentation

## 2012-10-28 DIAGNOSIS — I252 Old myocardial infarction: Secondary | ICD-10-CM | POA: Insufficient documentation

## 2012-10-28 DIAGNOSIS — R3915 Urgency of urination: Secondary | ICD-10-CM | POA: Insufficient documentation

## 2012-10-28 DIAGNOSIS — M549 Dorsalgia, unspecified: Secondary | ICD-10-CM | POA: Insufficient documentation

## 2012-10-28 DIAGNOSIS — W010XXA Fall on same level from slipping, tripping and stumbling without subsequent striking against object, initial encounter: Secondary | ICD-10-CM | POA: Insufficient documentation

## 2012-10-28 DIAGNOSIS — M129 Arthropathy, unspecified: Secondary | ICD-10-CM | POA: Insufficient documentation

## 2012-10-28 DIAGNOSIS — Z862 Personal history of diseases of the blood and blood-forming organs and certain disorders involving the immune mechanism: Secondary | ICD-10-CM | POA: Insufficient documentation

## 2012-10-28 DIAGNOSIS — W19XXXA Unspecified fall, initial encounter: Secondary | ICD-10-CM

## 2012-10-28 DIAGNOSIS — Z8719 Personal history of other diseases of the digestive system: Secondary | ICD-10-CM | POA: Insufficient documentation

## 2012-10-28 DIAGNOSIS — I10 Essential (primary) hypertension: Secondary | ICD-10-CM | POA: Insufficient documentation

## 2012-10-28 LAB — CBC
HCT: 34.9 % — ABNORMAL LOW (ref 39.0–52.0)
RDW: 13.4 % (ref 11.5–15.5)
WBC: 8.2 10*3/uL (ref 4.0–10.5)

## 2012-10-28 LAB — BASIC METABOLIC PANEL
BUN: 26 mg/dL — ABNORMAL HIGH (ref 6–23)
Chloride: 95 mEq/L — ABNORMAL LOW (ref 96–112)
GFR calc Af Amer: 68 mL/min — ABNORMAL LOW (ref >90)
Potassium: 4.3 mEq/L (ref 3.5–5.1)

## 2012-10-28 LAB — URINALYSIS, ROUTINE W REFLEX MICROSCOPIC
Glucose, UA: NEGATIVE mg/dL
Leukocytes, UA: NEGATIVE
Protein, ur: 30 mg/dL — AB
pH: 7.5 (ref 5.0–8.0)

## 2012-10-28 LAB — URINE MICROSCOPIC-ADD ON: Urine-Other: NONE SEEN

## 2012-10-28 MED ORDER — MORPHINE SULFATE 4 MG/ML IJ SOLN
4.0000 mg | Freq: Once | INTRAMUSCULAR | Status: AC
  Administered 2012-10-28: 4 mg via INTRAVENOUS
  Filled 2012-10-28: qty 1

## 2012-10-28 MED ORDER — ONDANSETRON HCL 4 MG/2ML IJ SOLN
4.0000 mg | Freq: Once | INTRAMUSCULAR | Status: AC
  Administered 2012-10-28: 4 mg via INTRAVENOUS
  Filled 2012-10-28: qty 2

## 2012-10-28 NOTE — ED Notes (Signed)
Pt now c/o urinary retention.  Tried to urinate in treatment room, but unable to do so.

## 2012-10-28 NOTE — ED Provider Notes (Signed)
History     CSN: [id number]  Arrival date & time 10/28/12  0808   First MD Initiated Contact with Patient 10/28/12 1104      Chief Complaint  Patient presents with  . Back Pain  . Urinary Retention    (Consider location/radiation/quality/duration/timing/severity/associated sxs/prior treatment) HPI Pt presenting with c/o back pain and not able to urinate after fall last night in his bathroom.  He denies dizziness, no headache, no chest pain or palpitations or LOC associated with the fall.  Had some difficulty getting up from the floor.  States he has chronic back pain and chronic difficulty ambulating/unsteady on his feet for years.  However pain is worse after fall last night.  Pain from mid to lower back.  Has also had difficulty urinating in the past, is now feeling urinary urgency and lower abdominal pain due to not being able to pass urine since his fall.  No fever/chills.  No weakness of lower extremities.  Denies striking head.  There are no other associated systemic symptoms, there are no other alleviating or modifying factors.  Past Medical History  Diagnosis Date  . Kidney disease   . Diabetes mellitus   . Hypertension   . MI (myocardial infarction)     1974  . Peripheral vascular disease     veinsi rerouted in both legs   . Pneumonia     hx of   . Anemia   . Blood transfusion     hx of 6 months ago   . GERD (gastroesophageal reflux disease)   . Arthritis   . Bowel obstruction   . PONV (postoperative nausea and vomiting)     Past Surgical History  Procedure Laterality Date  . Back surgery      back surgery x 2   . Eye surgery      bilateral cataract surgery with implants   . Other surgical history      2 fatty tumors removed - on different arms   . Other surgical history      prostate surgery x 3   . Other surgical history      veins rerouted in both legs   . Other surgical history      hemorrhoid surgery  . Other surgical history      carpal tunnel right    . Other surgical history      big toe nails removed   . Other surgical history      repair of blood vessel in esophagus and stomach- 2006  . Other surgical history      bilateral kidney stents  2007  . Other surgical history      small bowel obstruction 2011   . Excision/release bursa hip  08/13/2011    Procedure: EXCISION/RELEASE BURSA HIP;  Surgeon: Jacki Cones, MD;  Location: [facility] ORS;  Service: Orthopedics;  Laterality: Right;  Right Hip Excision Tumor  . Esophageal manometry  11/08/2011    Procedure: ESOPHAGEAL MANOMETRY (EM);  Surgeon: Theda Belfast, MD;  Location: [facility] ENDOS[facility];  Service: Endoscopy;  Laterality: N/A;    No family history on file.  History  Substance Use Topics  . Smoking status: Former Smoker    Quit date: 07/22/1972  . Smokeless tobacco: Never Used  . Alcohol Use: No      Review of Systems ROS reviewed and all otherwise negative except for mentioned in HPI  Allergies  Amoxicillin; Apresoline esidrix; and Clarithromycin  Home Medications   Current Outpatient Rx  Name  Route  Sig  Dispense  Refill  . aspirin EC 81 MG tablet   Oral   Take 81 mg by mouth daily before breakfast.          . calcium-vitamin D (OSCAL WITH D) 500-200 MG-UNIT per tablet   Oral   Take 2 tablets by mouth daily.         . carvedilol (COREG) 25 MG tablet   Oral   Take 25 mg by mouth 2 (two) times daily with a meal.         . desoximetasone (TOPICORT) 0.25 % cream   Topical   Apply 1 application topically 2 (two) times daily as needed. For legs (itching)         . doxazosin (CARDURA) 4 MG tablet   Oral   Take 4 mg by mouth at bedtime.         . ferrous sulfate 325 (65 FE) MG tablet   Oral   Take 325 mg by mouth daily with breakfast.         . finasteride (PROSCAR) 5 MG tablet   Oral   Take 1 tablet (5 mg total) by mouth daily before breakfast.   30 tablet   1   . furosemide (LASIX) 40 MG tablet   Oral   Take 40 mg by mouth 2 (two) times  daily.         Marland Kitchen losartan (COZAAR) 100 MG tablet   Oral   Take 100 mg by mouth 2 (two) times daily.          . mirtazapine (REMERON) 30 MG tablet   Oral   Take 30 mg by mouth at bedtime.         . Multiple Vitamin (MULITIVITAMIN WITH MINERALS) TABS   Oral   Take 1 tablet by mouth daily before breakfast.          . Omeprazole (PRILOSEC PO)   Oral   Take 20 mg by mouth daily.          Marland Kitchen oxyCODONE-acetaminophen (PERCOCET) 7.5-325 MG per tablet   Oral   Take 1 tablet by mouth every 6 (six) hours as needed for pain. P/U   90 tablet   0     P/U   . polyethylene glycol (MIRALAX / GLYCOLAX) packet   Oral   Take 17 g by mouth daily before breakfast.          . rosuvastatin (CRESTOR) 20 MG tablet   Oral   Take 20 mg by mouth at bedtime.          . senna (SENOKOT) 8.6 MG TABS   Oral   Take 2 tablets by mouth at bedtime.         . sitaGLIPtin (JANUVIA) 50 MG tablet   Oral   Take 50 mg by mouth daily before breakfast.          . vitamin B-12 (CYANOCOBALAMIN) 1000 MCG tablet   Oral   Take 1,000 mcg by mouth daily before breakfast.          . vitamin C (ASCORBIC ACID) 500 MG tablet   Oral   Take 500 mg by mouth daily before breakfast.          . zaleplon (SONATA) 5 MG capsule   Oral   Take 5 mg by mouth at bedtime.           BP 156/67  Pulse 65  Temp(Src) 98.1 F (36.7  C)  Resp 18  SpO2 98% Vitals reviewed Physical Exam Physical Examination: General appearance - alert, well appearing, and in no distress Mental status - alert, oriented to person, place, and time Eyes - pupils equal and reactive, extraocular eye movements intact Mouth - mucous membranes moist, pharynx normal without lesions Neck- no midline tenderness to palpation Chest - clear to auscultation, no wheezes, rales or rhonchi, symmetric air entry Heart - normal rate, regular rhythm, normal S1, S2, no murmurs, rubs, clicks or gallops Abdomen - soft, nontender, nondistended, no  masses or organomegaly GU Male - foley catheter in place Back exam - midline tenderness to palpation of thoracic and lumbar spine, no CVA tenderness Neurological - alert, oriented, normal speech, strength 5/5 in extremities x 4, sensation intact Extremities - peripheral pulses normal, 2+ symmetric pedal edema- he and wife state this is at baseline, no clubbing or cyanosis Skin - normal coloration and turgor, no rashes, no suspicious skin lesions noted  ED Course  Procedures (including critical care time)  2:26 PM MRI ordered to evaluate for spinal cord injury due to urinary retention, pt is refusing MRI.  I and MRI tech have had a very lengthy discussion with patient about the reasons for concern for spinal cord injury, the risks of missing this diagnosis including paralysis, chronic inability to urinate and even death.  Pt states repeatedly that he is 79 years old and that he has the risk of dying every day and that he does not want to proceed with the MRI.   Multiple ways of explaining the risk to both him and his wife were attempted and he continues to refuse.  Wife states that she is all right with whatever patient decides.  Pt is requesting his discharge paperwork and thanked [place] for our help.    Labs Reviewed  URINALYSIS, ROUTINE W REFLEX MICROSCOPIC - Abnormal; Notable for the following:    Protein, ur 30 (*)    All other components within normal limits  CBC - Abnormal; Notable for the following:    Hemoglobin 11.6 (*)    HCT 34.9 (*)    Platelets 143 (*)    All other components within normal limits  BASIC METABOLIC PANEL - Abnormal; Notable for the following:    Sodium 130 (*)    Chloride 95 (*)    Glucose, Bld 113 (*)    BUN 26 (*)    GFR calc non Af Amer 59 (*)    GFR calc Af Amer 68 (*)    All other components within normal limits  URINE MICROSCOPIC-ADD ON   Dg Thoracic Spine 2 View  10/28/2012  *RADIOLOGY REPORT*  Clinical Data: Back pain.  THORACIC SPINE - 2 VIEW   Comparison: Lateral chest x-ray dated 07/13/2012  Findings: Stable mild degenerative changes of the thoracic spine noted without evidence of fracture or subluxation.  No bony lesions are seen.  IMPRESSION: Mild degenerative changes of the thoracic spine.   Original Report Authenticated By: Irish Lack, M.D.    Dg Lumbar Spine Complete  10/28/2012  *RADIOLOGY REPORT*  Clinical Data: Low back pain following fall.  LUMBAR SPINE - COMPLETE 4+ VIEW  Comparison: 02/22/2010 lumbar spine radiograph  Findings: Five non-rib bearing lumbar type vertebra are identified There is no evidence of acute fracture or subluxation. Diffuse osteopenia is again noted. Multilevel degenerative disc disease and spondylosis noted, moderate to severe at L4-L5 and L5-S1. No focal bony lesions or spondylolysis noted.  Renal artery stents are again identified.  IMPRESSION: No evidence of acute bony abnormality.  Diffuse osteopenia and degenerative changes as described.   Original Report Authenticated By: Harmon Pier, M.D.      1. Back pain   2. Fall, initial encounter   3. Urinary retention       MDM  Pt presents with c/o back pain after fall.  Also urinary retention.  He has had both issues in the past, however with new acute fall must be concerned about possible spinal cord injury in this case.  Foley catheter placed which relieved urinary retention.  xrays reassuring.  Please see note above regarding patient decision to not proceed with MRI and leave AMA.  Pt does have an appointment with Dr. Annabell Howells, urology for tomorrow.  Discharged with strict return precautions.  Pt agreeable with plan.        Ethelda Chick, MD 10/29/12 [phone]

## 2012-10-28 NOTE — ED Notes (Signed)
Pt in XR. 

## 2012-10-28 NOTE — ED Notes (Signed)
Pt states he got up to go to the bathroom last night, lost his balance and fell.  Pt denies dizziness preceding fall.  Pt states he fell back and injured his back.  Pt does not believe he hit his head.  Pt states "I've been unsteady on my feet for years."  Pt states he "keeps," back pain, but since his fall, the pain has been worse.

## 2012-11-07 ENCOUNTER — Ambulatory Visit: Payer: Medicare Other | Admitting: Vascular Surgery

## 2012-11-29 ENCOUNTER — Encounter: Payer: Self-pay | Admitting: Neurology

## 2012-11-29 ENCOUNTER — Ambulatory Visit (INDEPENDENT_AMBULATORY_CARE_PROVIDER_SITE_OTHER): Payer: Medicare Other | Admitting: Neurology

## 2012-11-29 VITALS — BP 173/61 | HR 58 | Wt 164.0 lb

## 2012-11-29 DIAGNOSIS — R413 Other amnesia: Secondary | ICD-10-CM

## 2012-11-29 DIAGNOSIS — M48061 Spinal stenosis, lumbar region without neurogenic claudication: Secondary | ICD-10-CM

## 2012-11-29 DIAGNOSIS — D518 Other vitamin B12 deficiency anemias: Secondary | ICD-10-CM

## 2012-11-29 DIAGNOSIS — R6889 Other general symptoms and signs: Secondary | ICD-10-CM

## 2012-11-29 DIAGNOSIS — R269 Unspecified abnormalities of gait and mobility: Secondary | ICD-10-CM

## 2012-11-29 DIAGNOSIS — E1142 Type 2 diabetes mellitus with diabetic polyneuropathy: Secondary | ICD-10-CM

## 2012-11-29 HISTORY — DX: Other amnesia: R41.3

## 2012-11-29 NOTE — Progress Notes (Signed)
Reason for visit: Gait disturbance  [name] is an 79 y.o. male  History of present illness:  [name] is an 79 year old right-handed white male with a history of diabetes associated with a diabetic peripheral neuropathy. The patient has bilateral foot drops associated with this, and a gait disorder. The patient has had a significant decline in his overall functional level over the last 6 months. The patient was in rehabilitation in December of 2013 after he sustained a bout of C. difficile colitis. The patient however, has had a significant change in his walking within the last one month, and he is now very limited in his ability to ambulate. The patient has a lot of problems arising from a chair, even from his lift chair. The patient has had some worsening problems with memory, and he has had an episode of urinary retention requiring an indwelling catheter. The patient is followed by Dr. Annabell Howells for this. The patient has recently had some issues with suicidal ideation, and his medications have been readjusted by his primary care physician. The patient has fallen within the last several weeks. The patient uses a walker for ambulation at home. The patient returns for an evaluation.  Past Medical History  Diagnosis Date  . Kidney disease   . Diabetes mellitus   . Hypertension   . MI (myocardial infarction)     1974  . Peripheral vascular disease     veinsi rerouted in both legs   . Pneumonia     hx of   . Anemia   . Blood transfusion     hx of 6 months ago   . GERD (gastroesophageal reflux disease)   . Arthritis   . Bowel obstruction   . PONV (postoperative nausea and vomiting)   . Gait disorder   . Depression   . Dyslipidemia   . Heart disease (organic)   . C. difficile colitis   . History of GI bleed     Upper  . Lumbosacral spinal stenosis   . Memory deficit 11/29/2012  . Degenerative arthritis   . Peptic ulcer disease     Past Surgical History  Procedure Laterality  Date  . Back surgery      back surgery x 2   . Eye surgery      bilateral cataract surgery with implants   . Other surgical history      2 fatty tumors removed - on different arms   . Other surgical history      prostate surgery x 3   . Other surgical history      veins rerouted in both legs   . Other surgical history      hemorrhoid surgery  . Other surgical history      carpal tunnel right   . Other surgical history      big toe nails removed   . Other surgical history      repair of blood vessel in esophagus and stomach- 2006  . Other surgical history      bilateral kidney stents  2007  . Other surgical history      small bowel obstruction 2011   . Excision/release bursa hip  08/13/2011    Procedure: EXCISION/RELEASE BURSA HIP;  Surgeon: Jacki Cones, MD;  Location: [facility];  Service: Orthopedics;  Laterality: Right;  Right Hip Excision Tumor  . Esophageal manometry  11/08/2011    Procedure: ESOPHAGEAL MANOMETRY (EM);  Surgeon: Theda Belfast, MD;  Location: [facility];  Service: Endoscopy;  Laterality: N/A;  . Hemorrhoid surgery    . Cataract extraction Bilateral   . Lipoma resection    . Transurethral resection of prostate      For benign prostate hypertrophy  . Carpal tunnel release Bilateral   . Ureteral stent placement Bilateral   . Cervical laminectomy    . Skin cancer resection      Left forehead  . Right hip surgery      Family History  Problem Relation Age of Onset  . Cancer Sister     Social history:  reports that he quit smoking about 40 years ago. He has never used smokeless tobacco. He reports that he does not drink alcohol or use illicit drugs.  Allergies:  Allergies  Allergen Reactions  . Amoxicillin Swelling  . Apresoline Esidrix (Hydralazine-Hctz) Itching  . Clarithromycin Itching    Medications:  Current Outpatient Prescriptions on File Prior to Visit  Medication Sig Dispense Refill  . aspirin EC 81 MG tablet Take 81 mg by mouth daily  before breakfast.       . calcium-vitamin D (OSCAL WITH D) 500-200 MG-UNIT per tablet Take 2 tablets by mouth daily.      . carvedilol (COREG) 25 MG tablet Take 25 mg by mouth 2 (two) times daily with a meal.      . desoximetasone (TOPICORT) 0.25 % cream Apply 1 application topically 2 (two) times daily as needed. For legs (itching)      . doxazosin (CARDURA) 4 MG tablet Take 4 mg by mouth at bedtime.      . ferrous sulfate 325 (65 FE) MG tablet Take 325 mg by mouth daily with breakfast.      . furosemide (LASIX) 40 MG tablet Take 40 mg by mouth 2 (two) times daily.      Marland Kitchen losartan (COZAAR) 100 MG tablet Take 100 mg by mouth 2 (two) times daily.       . Multiple Vitamin (MULITIVITAMIN WITH MINERALS) TABS Take 1 tablet by mouth daily before breakfast.       . Omeprazole (PRILOSEC PO) Take 20 mg by mouth daily.       Marland Kitchen oxyCODONE-acetaminophen (PERCOCET) 7.5-325 MG per tablet Take 1 tablet by mouth every 6 (six) hours as needed for pain. P/U  90 tablet  0  . polyethylene glycol (MIRALAX / GLYCOLAX) packet Take 17 g by mouth daily before breakfast.       . rosuvastatin (CRESTOR) 20 MG tablet Take 20 mg by mouth at bedtime.       . senna (SENOKOT) 8.6 MG TABS Take 2 tablets by mouth at bedtime.      . sitaGLIPtin (JANUVIA) 50 MG tablet Take 50 mg by mouth daily before breakfast.       . vitamin B-12 (CYANOCOBALAMIN) 1000 MCG tablet Take 1,000 mcg by mouth daily before breakfast.       . vitamin C (ASCORBIC ACID) 500 MG tablet Take 500 mg by mouth daily before breakfast.        No current facility-administered medications on file prior to visit.    ROS:  Out of a complete 14 system review of symptoms, the patient complains only of the following symptoms, and all other reviewed systems are negative.  Swelling in the legs Constipation Urination problems Easy bruising, easy bleeding Feeling cold, increased thirst Joint pain, joint swelling, aching muscles Allergies Memory loss, confusion,  numbness, weakness, difficulty swallowing Depression, anxiety, insomnia, sleepiness  Blood pressure 173/61, pulse 58,  weight 164 lb (74.39 kg).  Physical Exam  General: The patient is alert and cooperative at the time of the examination.  Skin: No significant peripheral edema is noted.   Neurologic Exam  Mental status: Mini-Mental status examination done today shows a total score of 17/30. The patient is able to name 6 animals in 60 seconds.  Cranial nerves: Facial symmetry is present. Speech is normal, no aphasia or dysarthria is noted. Extraocular movements are full. Visual fields are full.  Motor: The patient has good strength in all 4 extremities, with the exception of bilateral foot drops.  Coordination: The patient has good finger-nose-finger and heel-to-shin bilaterally.  Gait and station: The patient requires assistance with standing. Once up, the patient is unable to take a step, the feet are "glued to the floor". Tandem gait cannot be attempted. Romberg is positive, the patient tends to go backwards. No drift is seen.  Reflexes: Deep tendon reflexes are symmetric, but are depressed.   Assessment/Plan:  1. Peripheral neuropathy  2. Gait disturbance  3. Memory disturbance  The patient has significant worsening of his underlying clinical functional level. The patient does have a peripheral neuropathy with bilateral foot drops, but the patient currently is having gait problems that suggest a central nervous system process. The patient appears to have his feet "glued to the floor", unable to ambulate in our office today. The patient will need to be evaluated for possible cerebrovascular disease or normal pressure hydrocephalus. The patient will be set up for blood work today, and he will have MRI evaluation of the brain. If the ventriculomegaly is seen, a lumbar puncture will be done. The patient will be set up for home health physical therapy for gait training. The patient  followup in 4 months. The memory issues will be followed over time.  Marlan Palau MD 11/29/2012 7:51 PM  [facility] [address] [place], [place] [address]  Phone [phone] Fax [phone]

## 2012-11-30 LAB — VITAMIN B12: Vitamin B-12: >1999 pg/mL — ABNORMAL HIGH (ref 211–946)

## 2012-11-30 LAB — TSH: TSH: 1.97 u[IU]/mL (ref 0.450–4.500)

## 2012-11-30 LAB — RPR: RPR: NONREACTIVE

## 2012-11-30 NOTE — Progress Notes (Signed)
Quick Note:  I spoke to patient's wife and relayed normal labs, per Dr. Anne Hahn. ______

## 2012-12-08 ENCOUNTER — Other Ambulatory Visit: Payer: Self-pay

## 2012-12-08 MED ORDER — OXYCODONE-ACETAMINOPHEN 7.5-325 MG PO TABS: 1.0000 | ORAL_TABLET | Freq: Four times a day (QID) | ORAL | Status: DC | PRN

## 2012-12-08 NOTE — Telephone Encounter (Signed)
Peggy called to request a refill on Oxycodone.  They would like to pick up the Rx when it's ready.

## 2012-12-11 ENCOUNTER — Encounter: Payer: Self-pay | Admitting: Vascular Surgery

## 2012-12-12 ENCOUNTER — Ambulatory Visit (INDEPENDENT_AMBULATORY_CARE_PROVIDER_SITE_OTHER): Payer: Medicare Other | Admitting: Vascular Surgery

## 2012-12-12 ENCOUNTER — Telehealth: Payer: Self-pay | Admitting: *Deleted

## 2012-12-12 ENCOUNTER — Encounter: Payer: Self-pay | Admitting: Vascular Surgery

## 2012-12-12 ENCOUNTER — Encounter (INDEPENDENT_AMBULATORY_CARE_PROVIDER_SITE_OTHER): Payer: Medicare Other | Admitting: *Deleted

## 2012-12-12 VITALS — BP 199/68 | HR 58 | Resp 18 | Ht 69.0 in | Wt 175.0 lb

## 2012-12-12 DIAGNOSIS — Z48812 Encounter for surgical aftercare following surgery on the circulatory system: Secondary | ICD-10-CM

## 2012-12-12 DIAGNOSIS — I739 Peripheral vascular disease, unspecified: Secondary | ICD-10-CM

## 2012-12-12 NOTE — Telephone Encounter (Signed)
Dispensed Alprazolam 0.5mg  tabs #3 Lot [id number] Exp 10/2013

## 2012-12-12 NOTE — Telephone Encounter (Signed)
Patient is having a MRI done on 12/15/2012 and is closterfobic and is need a xanax pack before he go. Per the work-in Dr. Frances Furbish Give the patient one xanax pack for Friday.

## 2012-12-12 NOTE — Progress Notes (Signed)
The patient presents today for followup of bilateral lower extremity femoral-popliteal bypasses. These were staged procedures in 1998 and 1999. The patient has had significant clinical deterioration since my last visit with him. He has now in a wheelchair. He reports that he has inability to stand and support himself from weakness in both lower Shoney's. He was admitted to the hospital with pneumonia and December of 2013 had a protracted recovery following this. He is undergoing neurologic evaluation as well. He is here today with his wife to get some history and both report that his memory is failing quickly.  Past Medical History  Diagnosis Date  . Kidney disease   . Diabetes mellitus   . Hypertension   . MI (myocardial infarction)     1974  . Peripheral vascular disease     veinsi rerouted in both legs   . Pneumonia     hx of   . Anemia   . Blood transfusion     hx of 6 months ago   . GERD (gastroesophageal reflux disease)   . Arthritis   . Bowel obstruction   . PONV (postoperative nausea and vomiting)   . Gait disorder   . Depression   . Dyslipidemia   . Heart disease (organic)   . C. difficile colitis   . History of GI bleed     Upper  . Lumbosacral spinal stenosis   . Memory deficit 11/29/2012  . Degenerative arthritis   . Peptic ulcer disease     History  Substance Use Topics  . Smoking status: Former Smoker    Quit date: 07/22/1972  . Smokeless tobacco: Never Used  . Alcohol Use: No    Family History  Problem Relation Age of Onset  . Cancer Sister     Allergies  Allergen Reactions  . Amoxicillin Swelling  . Apresoline Esidrix (Hydralazine-Hctz) Itching  . Clarithromycin Itching    Current outpatient prescriptions:aspirin EC 81 MG tablet, Take 81 mg by mouth daily before breakfast. , Disp: , Rfl: ;  calcium-vitamin D (OSCAL WITH D) 500-200 MG-UNIT per tablet, Take 2 tablets by mouth daily., Disp: , Rfl: ;  carvedilol (COREG) 25 MG tablet, Take 25 mg by mouth  2 (two) times daily with a meal., Disp: , Rfl:  desoximetasone (TOPICORT) 0.25 % cream, Apply 1 application topically 2 (two) times daily as needed. For legs (itching), Disp: , Rfl: ;  doxazosin (CARDURA) 4 MG tablet, Take 4 mg by mouth at bedtime., Disp: , Rfl: ;  ferrous sulfate 325 (65 FE) MG tablet, Take 325 mg by mouth daily with breakfast., Disp: , Rfl: ;  finasteride (PROSCAR) 5 MG tablet, Take 5 mg by mouth daily., Disp: , Rfl:  furosemide (LASIX) 40 MG tablet, Take 40 mg by mouth 2 (two) times daily., Disp: , Rfl: ;  losartan (COZAAR) 100 MG tablet, Take 100 mg by mouth 2 (two) times daily. , Disp: , Rfl: ;  mirtazapine (REMERON) 15 MG tablet, Take 15 mg by mouth at bedtime., Disp: , Rfl: ;  Multiple Vitamin (MULITIVITAMIN WITH MINERALS) TABS, Take 1 tablet by mouth daily before breakfast. , Disp: , Rfl:  Omeprazole (PRILOSEC PO), Take 20 mg by mouth daily. , Disp: , Rfl: ;  oxyCODONE-acetaminophen (PERCOCET) 7.5-325 MG per tablet, Take 1 tablet by mouth every 6 (six) hours as needed for pain. P/U, Disp: 90 tablet, Rfl: 0;  polyethylene glycol (MIRALAX / GLYCOLAX) packet, Take 17 g by mouth daily before breakfast. , Disp: , Rfl: ;  rosuvastatin (CRESTOR) 20 MG tablet, Take 20 mg by mouth at bedtime. , Disp: , Rfl:  senna (SENOKOT) 8.6 MG TABS, Take 2 tablets by mouth at bedtime., Disp: , Rfl: ;  sertraline (ZOLOFT) 25 MG tablet, Take 25 mg by mouth daily., Disp: , Rfl: ;  sitaGLIPtin (JANUVIA) 50 MG tablet, Take 50 mg by mouth daily before breakfast. , Disp: , Rfl: ;  traZODone (DESYREL) 50 MG tablet, Take 50 mg by mouth daily., Disp: , Rfl: ;  vitamin B-12 (CYANOCOBALAMIN) 1000 MCG tablet, Take 1,000 mcg by mouth daily before breakfast. , Disp: , Rfl:  vitamin C (ASCORBIC ACID) 500 MG tablet, Take 500 mg by mouth daily before breakfast. , Disp: , Rfl:   BP 199/68  Pulse 58  Resp 18  Ht 5\' 9"  (1.753 m)  Wt 175 lb (79.379 kg)  BMI 25.83 kg/m2  Body mass index is 25.83  kg/(m^2).       Physical exam alert oriented gentleman sitting in a wheelchair in no acute distress Respirations equal and nonlabored Old range of motion from a neurologic standpoint Skin without ulcers or rashes Palpable popliteal pulses bilaterally.  Noninvasive studies revealed patent femoral to popliteal pulses bilaterally. He does have known narrowing in his outflow vessel below the distal anastomosis on the right and is been present for a number of years.  Impression and plan: Status post bilateral staged femoral popliteal bypasses in 1998 in 1999. These remain patent. I do not feel that he has any arterial insufficiency to explain his leg symptoms. I discussed this at length with the patient and his wife present. I feel is appropriate to discontinue vascular lab followup since he is wheelchair-bound related to other issues. I did discuss signs of worsening ischemia and he will notify us immediately should this occur. Otherwise he will see [place] on an as-needed basis

## 2012-12-15 ENCOUNTER — Ambulatory Visit
Admission: RE | Admit: 2012-12-15 | Discharge: 2012-12-15 | Disposition: A | Discharge status: Home / Self Care | Payer: Medicare Other | Source: Ambulatory Visit | Attending: Neurology | Admitting: Neurology

## 2012-12-15 DIAGNOSIS — R269 Unspecified abnormalities of gait and mobility: Secondary | ICD-10-CM

## 2012-12-18 ENCOUNTER — Telehealth: Payer: Self-pay | Admitting: Neurology

## 2012-12-18 DIAGNOSIS — R269 Unspecified abnormalities of gait and mobility: Secondary | ICD-10-CM

## 2012-12-18 NOTE — Telephone Encounter (Signed)
I called the patient and I talked with the wife. The MRI the brain showed minimal small vessel disease, no evidence of normal pressure hydrocephalus. The patient is to undergo home health physical therapy.

## 2013-01-03 ENCOUNTER — Telehealth: Payer: Self-pay | Admitting: *Deleted

## 2013-01-03 DIAGNOSIS — R269 Unspecified abnormalities of gait and mobility: Secondary | ICD-10-CM

## 2013-01-03 NOTE — Telephone Encounter (Signed)
Patient physical therapist  [name]  is asking for verbal orders on how on long the order is recommended for the patient.  And would like a referral for a bedside commode. Best contact number  [phone]  fax [phone] [facility].

## 2013-01-03 NOTE — Telephone Encounter (Signed)
I called and talked with the physical therapist. They're planning on doing this therapy over the next 3 weeks. I have okayed the order. The patient will also get a prescription for a bedside commode.

## 2013-01-22 ENCOUNTER — Other Ambulatory Visit: Payer: Self-pay

## 2013-01-22 MED ORDER — OXYCODONE-ACETAMINOPHEN 7.5-325 MG PO TABS: 1.0000 | ORAL_TABLET | Freq: Four times a day (QID) | ORAL | Status: DC | PRN

## 2013-01-22 NOTE — Telephone Encounter (Signed)
Rx ready for pick up.  I called patient, got no answer.  Left message.

## 2013-01-22 NOTE — Telephone Encounter (Signed)
Ms Honse called requesting a refill on Percocet for the patient.  They would like to pick up the Rx when it's ready.  Call back number [phone] or [phone].

## 2013-03-06 ENCOUNTER — Other Ambulatory Visit: Payer: Self-pay

## 2013-03-06 MED ORDER — OXYCODONE-ACETAMINOPHEN 7.5-325 MG PO TABS: 1.0000 | ORAL_TABLET | Freq: Four times a day (QID) | ORAL | Status: DC | PRN

## 2013-03-06 NOTE — Telephone Encounter (Signed)
Rx signed, ready for pick up.  I called patient.  Spoke with spouse.  She understands Rx is only written for 7 day supply.  She will be in to pick up the Rx and will call us back when the next refill is needed.

## 2013-03-06 NOTE — Telephone Encounter (Signed)
Rx updated to 7 day supply.  Dr Frances Furbish will only prescribe enough to last until Dr Anne Hahn returns.

## 2013-03-06 NOTE — Telephone Encounter (Signed)
Ms Buening called requesting a refill on the patients Percocet.  They would like to pick up the Rx when it's ready.  Call back number [phone].  Dr Anne Hahn is out of the office, forwarding request to Dr Frances Furbish, [facility]

## 2013-03-14 ENCOUNTER — Other Ambulatory Visit: Payer: Self-pay | Admitting: Internal Medicine

## 2013-03-14 DIAGNOSIS — R918 Other nonspecific abnormal finding of lung field: Secondary | ICD-10-CM

## 2013-03-19 ENCOUNTER — Other Ambulatory Visit: Payer: Self-pay

## 2013-03-19 MED ORDER — OXYCODONE-ACETAMINOPHEN 7.5-325 MG PO TABS: 1.0000 | ORAL_TABLET | Freq: Four times a day (QID) | ORAL | Status: DC | PRN

## 2013-03-19 NOTE — Telephone Encounter (Signed)
Ms Repass called requesting a refill on Percocet for the patient.  They would like to pick up the Rx when it's ready.  Call back number [phone]

## 2013-03-20 ENCOUNTER — Other Ambulatory Visit: Payer: Self-pay | Admitting: Dermatology

## 2013-03-20 NOTE — Telephone Encounter (Signed)
Rx signed, ready for pick up.  I called the patient, got no answer.  Left message.

## 2013-03-21 ENCOUNTER — Ambulatory Visit
Admission: RE | Admit: 2013-03-21 | Discharge: 2013-03-21 | Disposition: A | Discharge status: Home / Self Care | Payer: Medicare Other | Source: Ambulatory Visit | Attending: Internal Medicine | Admitting: Internal Medicine

## 2013-03-21 DIAGNOSIS — R918 Other nonspecific abnormal finding of lung field: Secondary | ICD-10-CM

## 2013-05-02 ENCOUNTER — Telehealth: Payer: Self-pay | Admitting: Neurology

## 2013-05-02 ENCOUNTER — Other Ambulatory Visit: Payer: Self-pay

## 2013-05-02 MED ORDER — OXYCODONE-ACETAMINOPHEN 7.5-325 MG PO TABS: 1.0000 | ORAL_TABLET | Freq: Four times a day (QID) | ORAL | Status: DC | PRN

## 2013-05-02 NOTE — Telephone Encounter (Signed)
Call patient and talk with his wife peggy and inform her that the patient's prescription was ready to be picked up. Patient's wife verbalized understanding.

## 2013-06-08 ENCOUNTER — Ambulatory Visit
Admission: RE | Admit: 2013-06-08 | Discharge: 2013-06-08 | Disposition: A | Discharge status: Home / Self Care | Payer: Medicare Other | Source: Ambulatory Visit | Attending: Neurology | Admitting: Neurology

## 2013-06-08 ENCOUNTER — Ambulatory Visit (INDEPENDENT_AMBULATORY_CARE_PROVIDER_SITE_OTHER): Payer: Medicare Other | Admitting: Neurology

## 2013-06-08 ENCOUNTER — Encounter: Payer: Self-pay | Admitting: Neurology

## 2013-06-08 ENCOUNTER — Telehealth: Payer: Self-pay | Admitting: Neurology

## 2013-06-08 ENCOUNTER — Encounter (INDEPENDENT_AMBULATORY_CARE_PROVIDER_SITE_OTHER): Payer: Self-pay

## 2013-06-08 VITALS — BP 143/63 | HR 62 | Wt 177.0 lb

## 2013-06-08 DIAGNOSIS — E1142 Type 2 diabetes mellitus with diabetic polyneuropathy: Secondary | ICD-10-CM

## 2013-06-08 DIAGNOSIS — M79609 Pain in unspecified limb: Secondary | ICD-10-CM

## 2013-06-08 DIAGNOSIS — R269 Unspecified abnormalities of gait and mobility: Secondary | ICD-10-CM

## 2013-06-08 DIAGNOSIS — R413 Other amnesia: Secondary | ICD-10-CM

## 2013-06-08 MED ORDER — OXYCODONE-ACETAMINOPHEN 7.5-325 MG PO TABS: 1.0000 | ORAL_TABLET | Freq: Four times a day (QID) | ORAL | Status: DC | PRN

## 2013-06-08 NOTE — Progress Notes (Signed)
Reason for visit: Peripheral neuropathy  [name] is an 79 y.o. male  History of present illness:  [name] is a 79 year old right-handed white male with a history of diabetes, diabetic peripheral neuropathy, and a gait disorder. When last seen, the patient underwent MRI evaluation of the brain to exclude the possibility of normal pressure hydrocephalus, but the study showed only minimal small vessel disease. The patient has undergone physical therapy in the home with some benefit. The patient is walking in the home with a walker. The patient fell around Thanksgiving of 2014. The patient has hurt his right foot with some bruising, swelling. The patient still has some pain and swelling. The patient also hurt his right knee, but this is improving over time. The patient has not had any further falls. The patient takes oxycodone for pain if needed. The patient returns to the office today for an evaluation.  Past Medical History  Diagnosis Date  . Kidney disease   . Diabetes mellitus   . Hypertension   . MI (myocardial infarction)     1974  . Peripheral vascular disease     veinsi rerouted in both legs   . Pneumonia     hx of   . Anemia   . Blood transfusion     hx of 6 months ago   . GERD (gastroesophageal reflux disease)   . Arthritis   . Bowel obstruction   . PONV (postoperative nausea and vomiting)   . Gait disorder   . Depression   . Dyslipidemia   . Heart disease (organic)   . C. difficile colitis   . History of GI bleed     Upper  . Lumbosacral spinal stenosis   . Memory deficit 11/29/2012  . Degenerative arthritis   . Peptic ulcer disease     Past Surgical History  Procedure Laterality Date  . Back surgery      back surgery x 2   . Eye surgery      bilateral cataract surgery with implants   . Other surgical history      2 fatty tumors removed - on different arms   . Other surgical history      prostate surgery x 3   . Other surgical history      veins  rerouted in both legs   . Other surgical history      hemorrhoid surgery  . Other surgical history      carpal tunnel right   . Other surgical history      big toe nails removed   . Other surgical history      repair of blood vessel in esophagus and stomach- 2006  . Other surgical history      bilateral kidney stents  2007  . Other surgical history      small bowel obstruction 2011   . Excision/release bursa hip  08/13/2011    Procedure: EXCISION/RELEASE BURSA HIP;  Surgeon: Jacki Cones, MD;  Location: [facility];  Service: Orthopedics;  Laterality: Right;  Right Hip Excision Tumor  . Esophageal manometry  11/08/2011    Procedure: ESOPHAGEAL MANOMETRY (EM);  Surgeon: Theda Belfast, MD;  Location: [facility];  Service: Endoscopy;  Laterality: N/A;  . Hemorrhoid surgery    . Cataract extraction Bilateral   . Lipoma resection    . Transurethral resection of prostate      For benign prostate hypertrophy  . Carpal tunnel release Bilateral   . Ureteral stent  placement Bilateral   . Cervical laminectomy    . Skin cancer resection      Left forehead  . Right hip surgery    . Femoral-popliteal bypass graft Left 07-08-1997    Gretta Began MD  . Femoral-popliteal bypass graft Right 06-04-1997    Gretta Began MD    Family History  Problem Relation Age of Onset  . Cancer Sister     Social history:  reports that he quit smoking about 40 years ago. He has never used smokeless tobacco. He reports that he does not drink alcohol or use illicit drugs.    Allergies  Allergen Reactions  . Amoxicillin Swelling  . Apresoline Esidrix [Hydralazine-Hctz] Itching  . Clarithromycin Itching    Medications:  Current Outpatient Prescriptions on File Prior to Visit  Medication Sig Dispense Refill  . aspirin EC 81 MG tablet Take 81 mg by mouth daily before breakfast.       . calcium-vitamin D (OSCAL WITH D) 500-200 MG-UNIT per tablet Take 2 tablets by mouth daily.      . carvedilol (COREG) 25 MG  tablet Take 25 mg by mouth 2 (two) times daily with a meal.      . desoximetasone (TOPICORT) 0.25 % cream Apply 1 application topically 2 (two) times daily as needed. For legs (itching)      . doxazosin (CARDURA) 4 MG tablet Take 4 mg by mouth at bedtime.      . ferrous sulfate 325 (65 FE) MG tablet Take 325 mg by mouth daily with breakfast.      . finasteride (PROSCAR) 5 MG tablet Take 5 mg by mouth daily.      . furosemide (LASIX) 40 MG tablet Take 40 mg by mouth 2 (two) times daily.      Marland Kitchen losartan (COZAAR) 100 MG tablet Take 100 mg by mouth 2 (two) times daily.       . mirtazapine (REMERON) 15 MG tablet Take 15 mg by mouth at bedtime.      . Multiple Vitamin (MULITIVITAMIN WITH MINERALS) TABS Take 1 tablet by mouth daily before breakfast.       . Omeprazole (PRILOSEC PO) Take 20 mg by mouth daily.       . polyethylene glycol (MIRALAX / GLYCOLAX) packet Take 17 g by mouth daily before breakfast.       . senna (SENOKOT) 8.6 MG TABS Take 2 tablets by mouth at bedtime.      . sertraline (ZOLOFT) 25 MG tablet Take 25 mg by mouth daily.      . sitaGLIPtin (JANUVIA) 50 MG tablet Take 50 mg by mouth daily before breakfast.       . vitamin B-12 (CYANOCOBALAMIN) 1000 MCG tablet Take 1,000 mcg by mouth daily before breakfast.       . vitamin C (ASCORBIC ACID) 500 MG tablet Take 500 mg by mouth daily before breakfast.        No current facility-administered medications on file prior to visit.    ROS:  Out of a complete 14 system review of symptoms, the patient complains only of the following symptoms, and all other reviewed systems are negative.  Fatigue Leg swelling Frequency of urination Walking difficulty Easy bruising Memory disturbance Depression  Blood pressure 143/63, pulse 62, weight 177 lb (80.287 kg).  Physical Exam  General: The patient is alert and cooperative at the time of the examination.  Skin: 2+ edema of the right foot and ankle is seen, 1+ edema on the  left is  noted.   Neurologic Exam  Mental status: The Mini-Mental status examination done today shows a total score 22/30.  Cranial nerves: Facial symmetry is present. Speech is normal, no aphasia or dysarthria is noted. Extraocular movements are full. Visual fields are full.  Motor: The patient has good strength in all 4 extremities, with exception that there is some slight weakness of the intrinsic muscles of the hands bilaterally, foot drops are noted.  Sensory examination: Soft touch sensation on the face, arms, and legs is symmetric.  Coordination: The patient has good finger-nose-finger and heel-to-shin bilaterally.  Gait and station: The gait was not tested.  Reflexes: Deep tendon reflexes are symmetric.   Assessment/Plan:  1. Peripheral neuropathy  2. Diabetes  3. Gait disorder  4. Memory disorder  The patient will be sent for an x-ray of the right foot today. The patient will continue to use his walker for ambulation inside the house. The patient uses a wheelchair outside the house. The patient will followup in about 5 or 6 months. The patient is focused on safety issues, another session of physical therapy may benefit him at some point in the future.  Marlan Palau MD 06/09/2013 9:17 AM  [facility] [address] [place], [place] [address]  Phone [phone] Fax [phone]

## 2013-06-08 NOTE — Patient Instructions (Signed)

## 2013-06-08 NOTE — Telephone Encounter (Signed)
I called patient. The x-ray of the foot does not show definite fracture, osteoporosis is present. Given the swelling and the bruising that occurred, the patient may have done damage to a ligament or tendon.

## 2013-07-06 ENCOUNTER — Other Ambulatory Visit: Payer: Self-pay

## 2013-07-06 MED ORDER — OXYCODONE-ACETAMINOPHEN 7.5-325 MG PO TABS: 1.0000 | ORAL_TABLET | Freq: Four times a day (QID) | ORAL | Status: DC | PRN

## 2013-07-06 NOTE — Telephone Encounter (Signed)
Called patient and spoke with patient's wife Vickii Chafe to inform her that the patient's Rx was ready to be picked up at the front desk. I advised the patient's wife that if the patient has any other problems, questions or concerns to call the office. Patient wife Vickii Chafe verbalized understanding.

## 2013-07-06 NOTE — Telephone Encounter (Signed)
Ms Shaker called requesting a refill on the patients Percocet.  They would like to pick up the Rx when it's ready.  Call back number [phone]

## 2013-07-17 ENCOUNTER — Other Ambulatory Visit (HOSPITAL_COMMUNITY): Payer: Self-pay | Admitting: Cardiology

## 2013-07-17 DIAGNOSIS — N183 Chronic kidney disease, stage 3 unspecified: Secondary | ICD-10-CM

## 2013-07-18 ENCOUNTER — Other Ambulatory Visit (HOSPITAL_COMMUNITY): Payer: Self-pay | Admitting: Cardiology

## 2013-07-18 DIAGNOSIS — N183 Chronic kidney disease, stage 3 unspecified: Secondary | ICD-10-CM

## 2013-07-19 ENCOUNTER — Ambulatory Visit (HOSPITAL_COMMUNITY): Payer: Medicare Other | Attending: Nephrology

## 2013-07-19 DIAGNOSIS — I701 Atherosclerosis of renal artery: Secondary | ICD-10-CM | POA: Insufficient documentation

## 2013-07-19 DIAGNOSIS — E119 Type 2 diabetes mellitus without complications: Secondary | ICD-10-CM | POA: Insufficient documentation

## 2013-07-19 DIAGNOSIS — N289 Disorder of kidney and ureter, unspecified: Secondary | ICD-10-CM | POA: Insufficient documentation

## 2013-07-19 DIAGNOSIS — E785 Hyperlipidemia, unspecified: Secondary | ICD-10-CM | POA: Insufficient documentation

## 2013-07-19 DIAGNOSIS — Z87891 Personal history of nicotine dependence: Secondary | ICD-10-CM | POA: Insufficient documentation

## 2013-07-19 DIAGNOSIS — N183 Chronic kidney disease, stage 3 unspecified: Secondary | ICD-10-CM | POA: Insufficient documentation

## 2013-07-19 DIAGNOSIS — I129 Hypertensive chronic kidney disease with stage 1 through stage 4 chronic kidney disease, or unspecified chronic kidney disease: Secondary | ICD-10-CM | POA: Insufficient documentation

## 2013-07-19 DIAGNOSIS — I739 Peripheral vascular disease, unspecified: Secondary | ICD-10-CM | POA: Insufficient documentation

## 2013-07-19 DIAGNOSIS — I700 Atherosclerosis of aorta: Secondary | ICD-10-CM

## 2013-07-19 DIAGNOSIS — I2510 Atherosclerotic heart disease of native coronary artery without angina pectoris: Secondary | ICD-10-CM | POA: Insufficient documentation

## 2013-08-06 ENCOUNTER — Telehealth: Payer: Self-pay | Admitting: Neurology

## 2013-08-06 DIAGNOSIS — G5600 Carpal tunnel syndrome, unspecified upper limb: Secondary | ICD-10-CM

## 2013-08-06 NOTE — Telephone Encounter (Signed)
Talked to patient wife she states he has carpal tunnel in his hand and per Dr. Jannifer Franklin to where a brace to help with this . Patient wife also stated dr. Jannifer Franklin asked the patient did he want to have this corrected the patient declined at the time but wants it done know and would like to be referred to  Dr. Fredna Dow .

## 2013-08-06 NOTE — Telephone Encounter (Signed)
Has brace on hand for carpal tunnel--now hand has gone numb--please call

## 2013-08-06 NOTE — Telephone Encounter (Signed)
Called the patient and I talked with the wife. The patient is now having increasing numbness of the hands, dropping things. The patient wants to consider evaluation for carpal tunnel syndrome. Nerve conduction study done in January 2014 showed moderate bilateral carpal tunnel syndrome. I'll send the patient to Dr. Fredna Dow

## 2013-08-27 ENCOUNTER — Other Ambulatory Visit: Payer: Self-pay | Admitting: Neurology

## 2013-08-27 MED ORDER — OXYCODONE-ACETAMINOPHEN 7.5-325 MG PO TABS: 1.0000 | ORAL_TABLET | Freq: Four times a day (QID) | ORAL | Status: DC | PRN

## 2013-08-27 NOTE — Telephone Encounter (Signed)
Called patient and spoke with patient's wife peggy informing her that the patient's Rx was ready to be picked up at the front desk and if patient has any other problems, questions or concerns to call the office. Patient's wife verbalized understanding.

## 2013-08-27 NOTE — Telephone Encounter (Signed)
Pt's wife called for refill on her husband's percocet.  Please call when ready for pick up.  Thank you

## 2013-09-17 ENCOUNTER — Other Ambulatory Visit: Payer: Self-pay | Admitting: Dermatology

## 2013-10-03 ENCOUNTER — Other Ambulatory Visit: Payer: Self-pay | Admitting: Neurology

## 2013-10-03 MED ORDER — OXYCODONE-ACETAMINOPHEN 7.5-325 MG PO TABS: 1.0000 | ORAL_TABLET | Freq: Four times a day (QID) | ORAL | Status: DC | PRN

## 2013-10-03 NOTE — Telephone Encounter (Signed)
Patient's wife calling for refill of Percocet for her husband.

## 2013-10-03 NOTE — Telephone Encounter (Signed)
Pt calling for medication refill. Please advise

## 2013-10-04 NOTE — Telephone Encounter (Signed)
Patient's wife was notified that the prescription is ready for pickup at the front desk.  She verbalizied understanding.

## 2013-10-15 ENCOUNTER — Ambulatory Visit
Admission: RE | Admit: 2013-10-15 | Discharge: 2013-10-15 | Disposition: A | Discharge status: Home / Self Care | Payer: Medicare Other | Source: Ambulatory Visit | Attending: Orthopedic Surgery | Admitting: Orthopedic Surgery

## 2013-10-15 ENCOUNTER — Other Ambulatory Visit: Payer: Self-pay | Admitting: Orthopedic Surgery

## 2013-10-15 DIAGNOSIS — M542 Cervicalgia: Secondary | ICD-10-CM

## 2013-10-22 ENCOUNTER — Other Ambulatory Visit (HOSPITAL_COMMUNITY): Payer: Self-pay | Admitting: Orthopedic Surgery

## 2013-10-24 ENCOUNTER — Other Ambulatory Visit (HOSPITAL_COMMUNITY): Payer: Self-pay | Admitting: Orthopedic Surgery

## 2013-10-24 DIAGNOSIS — M25731 Osteophyte, right wrist: Secondary | ICD-10-CM

## 2013-11-05 ENCOUNTER — Ambulatory Visit (HOSPITAL_COMMUNITY)
Admission: RE | Admit: 2013-11-05 | Discharge: 2013-11-05 | Disposition: A | Discharge status: Home / Self Care | Payer: Medicare Other | Source: Ambulatory Visit | Attending: Orthopedic Surgery | Admitting: Orthopedic Surgery

## 2013-11-05 DIAGNOSIS — M25539 Pain in unspecified wrist: Secondary | ICD-10-CM | POA: Insufficient documentation

## 2013-11-05 DIAGNOSIS — M25731 Osteophyte, right wrist: Secondary | ICD-10-CM

## 2013-11-14 ENCOUNTER — Other Ambulatory Visit: Payer: Self-pay | Admitting: Orthopedic Surgery

## 2013-11-15 ENCOUNTER — Encounter (HOSPITAL_BASED_OUTPATIENT_CLINIC_OR_DEPARTMENT_OTHER): Payer: Self-pay | Admitting: *Deleted

## 2013-11-15 ENCOUNTER — Other Ambulatory Visit: Payer: Self-pay | Admitting: Neurology

## 2013-11-15 MED ORDER — OXYCODONE-ACETAMINOPHEN 7.5-325 MG PO TABS: 1.0000 | ORAL_TABLET | Freq: Four times a day (QID) | ORAL | Status: DC | PRN

## 2013-11-15 NOTE — Telephone Encounter (Signed)
Request forwarded to provider for approval  

## 2013-11-15 NOTE — Progress Notes (Signed)
11/15/13 1606  OBSTRUCTIVE SLEEP APNEA  Have you ever been diagnosed with sleep apnea through a sleep study? No  Do you snore loudly (loud enough to be heard through closed doors)?  0  Do you often feel tired, fatigued, or sleepy during the daytime? 0  Has anyone observed you stop breathing during your sleep? 0  Do you have, or are you being treated for high blood pressure? 1  BMI more than 35 kg/m2? 0  Age over 78 years old? 1  Neck circumference greater than 40 cm/16 inches? 1  Gender: 1  Obstructive Sleep Apnea Score 4  Score 4 or greater  Results sent to PCP

## 2013-11-15 NOTE — Progress Notes (Signed)
Wife very sharp-care for him-will bring him for bmet-ekg-sees dr Oran Rein, but has been over a yr-sees dr Lorrene Reid freq.

## 2013-11-15 NOTE — Telephone Encounter (Signed)
Needs RX for oxyCODONE-acetaminophen (PERCOCET) 7.5-325 MG per tablet

## 2013-11-15 NOTE — Telephone Encounter (Signed)
Called pt and spoke with pt's wife peggy informing her that the pt's Rx was ready to be picked up at the front desk and if the pt has any other problems, questions or concerns to call the office. Wife verbalized understanding.

## 2013-11-16 ENCOUNTER — Other Ambulatory Visit: Payer: Self-pay

## 2013-11-16 ENCOUNTER — Encounter (HOSPITAL_BASED_OUTPATIENT_CLINIC_OR_DEPARTMENT_OTHER)
Admission: RE | Admit: 2013-11-16 | Discharge: 2013-11-16 | Disposition: A | Discharge status: Home / Self Care | Payer: Medicare Other | Source: Ambulatory Visit | Attending: Orthopedic Surgery | Admitting: Orthopedic Surgery

## 2013-11-16 DIAGNOSIS — Z01810 Encounter for preprocedural cardiovascular examination: Secondary | ICD-10-CM | POA: Insufficient documentation

## 2013-11-16 LAB — BASIC METABOLIC PANEL
BUN: 45 mg/dL — ABNORMAL HIGH (ref 6–23)
CO2: 27 mEq/L (ref 19–32)
Calcium: 9.3 mg/dL (ref 8.4–10.5)
Chloride: 94 mEq/L — ABNORMAL LOW (ref 96–112)
Creatinine, Ser: 1.73 mg/dL — ABNORMAL HIGH (ref 0.50–1.35)
GFR, EST AFRICAN AMERICAN: 38 mL/min — AB (ref >90)
GFR, EST NON AFRICAN AMERICAN: 33 mL/min — AB (ref >90)
Glucose, Bld: 131 mg/dL — ABNORMAL HIGH (ref 70–99)
POTASSIUM: 4.8 meq/L (ref 3.7–5.3)
Sodium: 136 mEq/L — ABNORMAL LOW (ref 137–147)

## 2013-11-16 NOTE — Pre-Procedure Instructions (Signed)
BUN and creatinine elevated; Elizebeth Koller, RN, notified.

## 2013-11-20 ENCOUNTER — Encounter (HOSPITAL_BASED_OUTPATIENT_CLINIC_OR_DEPARTMENT_OTHER)
Admission: RE | Disposition: A | Discharge status: Home / Self Care | Payer: Self-pay | Source: Ambulatory Visit | Attending: Orthopedic Surgery

## 2013-11-20 ENCOUNTER — Ambulatory Visit (HOSPITAL_BASED_OUTPATIENT_CLINIC_OR_DEPARTMENT_OTHER)
Admission: RE | Admit: 2013-11-20 | Discharge: 2013-11-20 | Disposition: A | Discharge status: Home / Self Care | Payer: Medicare Other | Source: Ambulatory Visit | Attending: Orthopedic Surgery | Admitting: Orthopedic Surgery

## 2013-11-20 ENCOUNTER — Encounter (HOSPITAL_BASED_OUTPATIENT_CLINIC_OR_DEPARTMENT_OTHER): Payer: Self-pay | Admitting: Orthopedic Surgery

## 2013-11-20 ENCOUNTER — Encounter (HOSPITAL_BASED_OUTPATIENT_CLINIC_OR_DEPARTMENT_OTHER): Payer: Medicare Other | Admitting: Certified Registered"

## 2013-11-20 ENCOUNTER — Ambulatory Visit (HOSPITAL_BASED_OUTPATIENT_CLINIC_OR_DEPARTMENT_OTHER): Payer: Medicare Other | Admitting: Certified Registered"

## 2013-11-20 DIAGNOSIS — E119 Type 2 diabetes mellitus without complications: Secondary | ICD-10-CM | POA: Insufficient documentation

## 2013-11-20 DIAGNOSIS — D649 Anemia, unspecified: Secondary | ICD-10-CM | POA: Insufficient documentation

## 2013-11-20 DIAGNOSIS — G5600 Carpal tunnel syndrome, unspecified upper limb: Secondary | ICD-10-CM | POA: Insufficient documentation

## 2013-11-20 DIAGNOSIS — Z7982 Long term (current) use of aspirin: Secondary | ICD-10-CM | POA: Insufficient documentation

## 2013-11-20 DIAGNOSIS — Z881 Allergy status to other antibiotic agents status: Secondary | ICD-10-CM | POA: Insufficient documentation

## 2013-11-20 DIAGNOSIS — I252 Old myocardial infarction: Secondary | ICD-10-CM | POA: Insufficient documentation

## 2013-11-20 DIAGNOSIS — Z79899 Other long term (current) drug therapy: Secondary | ICD-10-CM | POA: Insufficient documentation

## 2013-11-20 DIAGNOSIS — I10 Essential (primary) hypertension: Secondary | ICD-10-CM | POA: Insufficient documentation

## 2013-11-20 DIAGNOSIS — F329 Major depressive disorder, single episode, unspecified: Secondary | ICD-10-CM | POA: Insufficient documentation

## 2013-11-20 DIAGNOSIS — Z888 Allergy status to other drugs, medicaments and biological substances status: Secondary | ICD-10-CM | POA: Insufficient documentation

## 2013-11-20 DIAGNOSIS — F3289 Other specified depressive episodes: Secondary | ICD-10-CM | POA: Insufficient documentation

## 2013-11-20 DIAGNOSIS — E785 Hyperlipidemia, unspecified: Secondary | ICD-10-CM | POA: Insufficient documentation

## 2013-11-20 DIAGNOSIS — K219 Gastro-esophageal reflux disease without esophagitis: Secondary | ICD-10-CM | POA: Insufficient documentation

## 2013-11-20 DIAGNOSIS — Z880 Allergy status to penicillin: Secondary | ICD-10-CM | POA: Insufficient documentation

## 2013-11-20 DIAGNOSIS — K277 Chronic peptic ulcer, site unspecified, without hemorrhage or perforation: Secondary | ICD-10-CM | POA: Insufficient documentation

## 2013-11-20 DIAGNOSIS — I739 Peripheral vascular disease, unspecified: Secondary | ICD-10-CM | POA: Insufficient documentation

## 2013-11-20 DIAGNOSIS — Z87891 Personal history of nicotine dependence: Secondary | ICD-10-CM | POA: Insufficient documentation

## 2013-11-20 HISTORY — PX: HYPOTHENAR FAT PAD TRANSFER: SHX6408

## 2013-11-20 HISTORY — PX: CARPAL TUNNEL RELEASE: SHX101

## 2013-11-20 LAB — GLUCOSE, CAPILLARY
GLUCOSE-CAPILLARY: 133 mg/dL — AB (ref 70–99)
Glucose-Capillary: 120 mg/dL — ABNORMAL HIGH (ref 70–99)

## 2013-11-20 LAB — POCT HEMOGLOBIN-HEMACUE: HEMOGLOBIN: 10.7 g/dL — AB (ref 13.0–17.0)

## 2013-11-20 SURGERY — CARPAL TUNNEL RELEASE
Anesthesia: Monitor Anesthesia Care | Site: Wrist | Laterality: Left

## 2013-11-20 MED ORDER — VANCOMYCIN HCL IN DEXTROSE 1-5 GM/200ML-% IV SOLN
1000.0000 mg | INTRAVENOUS | Status: AC
  Administered 2013-11-20: 1000 mg via INTRAVENOUS

## 2013-11-20 MED ORDER — CHLORHEXIDINE GLUCONATE 4 % EX LIQD: 60.0000 mL | Freq: Once | CUTANEOUS | Status: DC

## 2013-11-20 MED ORDER — FENTANYL CITRATE 0.05 MG/ML IJ SOLN
INTRAMUSCULAR | Status: DC | PRN
  Administered 2013-11-20: 25 ug via INTRAVENOUS

## 2013-11-20 MED ORDER — LACTATED RINGERS IV SOLN
INTRAVENOUS | Status: DC
  Administered 2013-11-20: 10:00:00 via INTRAVENOUS

## 2013-11-20 MED ORDER — BUPIVACAINE HCL (PF) 0.25 % IJ SOLN
INTRAMUSCULAR | Status: DC | PRN
  Administered 2013-11-20: 10 mL

## 2013-11-20 MED ORDER — VANCOMYCIN HCL IN DEXTROSE 1-5 GM/200ML-% IV SOLN
INTRAVENOUS | Status: AC
  Filled 2013-11-20: qty 200

## 2013-11-20 MED ORDER — FENTANYL CITRATE 0.05 MG/ML IJ SOLN: 50.0000 ug | INTRAMUSCULAR | Status: DC | PRN

## 2013-11-20 MED ORDER — LIDOCAINE HCL (PF) 0.5 % IJ SOLN
INTRAMUSCULAR | Status: DC | PRN
  Administered 2013-11-20: 50 mL via INTRAVENOUS

## 2013-11-20 MED ORDER — ONDANSETRON HCL 4 MG/2ML IJ SOLN
INTRAMUSCULAR | Status: DC | PRN
Start: 2013-11-20 — End: 2013-11-20
  Administered 2013-11-20: 4 mg via INTRAVENOUS

## 2013-11-20 MED ORDER — FENTANYL CITRATE 0.05 MG/ML IJ SOLN: 25.0000 ug | INTRAMUSCULAR | Status: DC | PRN

## 2013-11-20 MED ORDER — ONDANSETRON HCL 4 MG/2ML IJ SOLN
4.0000 mg | Freq: Once | INTRAMUSCULAR | Status: DC | PRN
Start: 2013-11-20 — End: 2013-11-20

## 2013-11-20 MED ORDER — MIDAZOLAM HCL 2 MG/2ML IJ SOLN: 1.0000 mg | INTRAMUSCULAR | Status: DC | PRN

## 2013-11-20 MED ORDER — FENTANYL CITRATE 0.05 MG/ML IJ SOLN
INTRAMUSCULAR | Status: AC
  Filled 2013-11-20: qty 4

## 2013-11-20 MED ORDER — PROPOFOL INFUSION 10 MG/ML OPTIME
INTRAVENOUS | Status: DC | PRN
  Administered 2013-11-20: 50 ug/kg/min via INTRAVENOUS

## 2013-11-20 SURGICAL SUPPLY — 44 items
BLADE MINI RND TIP GREEN BEAV (BLADE) ×3 IMPLANT
BLADE SURG 15 STRL LF DISP TIS (BLADE) ×1 IMPLANT
BLADE SURG 15 STRL SS (BLADE) ×2
BNDG COHESIVE 3X5 TAN STRL LF (GAUZE/BANDAGES/DRESSINGS) ×3 IMPLANT
BNDG ESMARK 4X9 LF (GAUZE/BANDAGES/DRESSINGS) ×3 IMPLANT
BNDG GAUZE ELAST 4 BULKY (GAUZE/BANDAGES/DRESSINGS) ×3 IMPLANT
BNDG PLASTER X FAST 3X3 WHT LF (CAST SUPPLIES) ×15 IMPLANT
CHLORAPREP W/TINT 26ML (MISCELLANEOUS) ×3 IMPLANT
CORDS BIPOLAR (ELECTRODE) ×3 IMPLANT
COVER MAYO STAND STRL (DRAPES) ×3 IMPLANT
COVER TABLE BACK 60X90 (DRAPES) ×3 IMPLANT
CUFF TOURNIQUET SINGLE 18IN (TOURNIQUET CUFF) ×3 IMPLANT
DRAPE EXTREMITY T 121X128X90 (DRAPE) ×3 IMPLANT
DRAPE SURG 17X23 STRL (DRAPES) ×3 IMPLANT
DRSG KUZMA FLUFF (GAUZE/BANDAGES/DRESSINGS) ×3 IMPLANT
DRSG PAD ABDOMINAL 8X10 ST (GAUZE/BANDAGES/DRESSINGS) ×3 IMPLANT
GAUZE SPONGE 4X4 12PLY STRL (GAUZE/BANDAGES/DRESSINGS) ×3 IMPLANT
GAUZE XEROFORM 1X8 LF (GAUZE/BANDAGES/DRESSINGS) ×3 IMPLANT
GLOVE BIO SURGEON STRL SZ7 (GLOVE) ×3 IMPLANT
GLOVE BIOGEL PI IND STRL 7.0 (GLOVE) ×2 IMPLANT
GLOVE BIOGEL PI IND STRL 8 (GLOVE) ×2 IMPLANT
GLOVE BIOGEL PI IND STRL 8.5 (GLOVE) ×1 IMPLANT
GLOVE BIOGEL PI INDICATOR 7.0 (GLOVE) ×4
GLOVE BIOGEL PI INDICATOR 8 (GLOVE) ×4
GLOVE BIOGEL PI INDICATOR 8.5 (GLOVE) ×2
GLOVE ECLIPSE 7.0 STRL STRAW (GLOVE) ×3 IMPLANT
GLOVE SURG ORTHO 8.0 STRL STRW (GLOVE) ×3 IMPLANT
GOWN STRL REUS W/ TWL LRG LVL3 (GOWN DISPOSABLE) ×2 IMPLANT
GOWN STRL REUS W/TWL LRG LVL3 (GOWN DISPOSABLE) ×6
GOWN STRL REUS W/TWL XL LVL3 (GOWN DISPOSABLE) ×6 IMPLANT
NEEDLE 27GAX1X1/2 (NEEDLE) ×3 IMPLANT
NS IRRIG 1000ML POUR BTL (IV SOLUTION) ×3 IMPLANT
PACK BASIN DAY SURGERY FS (CUSTOM PROCEDURE TRAY) ×3 IMPLANT
PAD CAST 3X4 CTTN HI CHSV (CAST SUPPLIES) ×1 IMPLANT
PADDING CAST ABS 4INX4YD NS (CAST SUPPLIES) ×2
PADDING CAST ABS COTTON 4X4 ST (CAST SUPPLIES) ×1 IMPLANT
PADDING CAST COTTON 3X4 STRL (CAST SUPPLIES) ×2
STOCKINETTE 4X48 STRL (DRAPES) ×3 IMPLANT
SUT VICRYL 4-0 PS2 18IN ABS (SUTURE) ×3 IMPLANT
SUT VICRYL RAPIDE 4/0 PS 2 (SUTURE) ×6 IMPLANT
SYR BULB 3OZ (MISCELLANEOUS) ×3 IMPLANT
SYR CONTROL 10ML LL (SYRINGE) ×3 IMPLANT
TOWEL OR 17X24 6PK STRL BLUE (TOWEL DISPOSABLE) ×3 IMPLANT
UNDERPAD 30X30 INCONTINENT (UNDERPADS AND DIAPERS) ×3 IMPLANT

## 2013-11-20 NOTE — Op Note (Signed)
Dictation Number [id number]

## 2013-11-20 NOTE — Transfer of Care (Signed)
Immediate Anesthesia Transfer of Care Note  Patient: [name]  Procedure(s) Performed: Procedure(s): LEFT  CARPAL TUNNEL RELEASE (Left) HYPOTHENAR FAT PAD TRANSFER LEFT (Left)  Patient Location: PACU  Anesthesia Type:Bier block  Level of Consciousness: awake, alert , oriented and patient cooperative  Airway & Oxygen Therapy: Patient Spontanous Breathing and Patient connected to face mask oxygen  Post-op Assessment: Report given to PACU RN and Post -op Vital signs reviewed and stable  Post vital signs: Reviewed and stable  Complications: No apparent anesthesia complications

## 2013-11-20 NOTE — Anesthesia Postprocedure Evaluation (Signed)
  Anesthesia Post-op Note  Patient: [name]  Procedure(s) Performed: Procedure(s): LEFT  CARPAL TUNNEL RELEASE (Left) HYPOTHENAR FAT PAD TRANSFER LEFT (Left)  Patient Location: PACU  Anesthesia Type:MAC combined with regional for post-op pain  Level of Consciousness: awake, alert  and oriented  Airway and Oxygen Therapy: Patient Spontanous Breathing  Post-op Pain: none  Post-op Assessment: Post-op Vital signs reviewed  Post-op Vital Signs: Reviewed  Last Vitals:  Filed Vitals:   11/20/13 1315  BP: 174/95  Pulse: 67  Temp:   Resp: 16    Complications: No apparent anesthesia complications

## 2013-11-20 NOTE — Discharge Instructions (Addendum)

## 2013-11-20 NOTE — Anesthesia Preprocedure Evaluation (Signed)
Anesthesia Evaluation  Patient identified by MRN, date of birth, ID band Patient awake    Reviewed: Allergy & Precautions, H&P , NPO status , Patient's Chart, lab work & pertinent test results, reviewed documented beta blocker date and time   Airway Mallampati: I TM Distance: >3 FB Neck ROM: Limited    Dental  (+) Edentulous Upper, Edentulous Lower   Pulmonary former smoker,  breath sounds clear to auscultation        Cardiovascular hypertension, Pt. on medications and Pt. on home beta blockers + CAD and + Past MI (Doing well since 1974) Rhythm:Regular Rate:Normal     Neuro/Psych    GI/Hepatic GERD-  Medicated and Controlled,  Endo/Other  diabetes, Well Controlled, Type 2  Renal/GU Renal InsufficiencyRenal disease     Musculoskeletal   Abdominal   Peds  Hematology   Anesthesia Other Findings   Reproductive/Obstetrics                           Anesthesia Physical Anesthesia Plan  ASA: III  Anesthesia Plan: Bier Block and MAC   Post-op Pain Management:    Induction: Intravenous  Airway Management Planned: Simple Face Mask  Additional Equipment:   Intra-op Plan:   Post-operative Plan:   Informed Consent: I have reviewed the patients History and Physical, chart, labs and discussed the procedure including the risks, benefits and alternatives for the proposed anesthesia with the patient or authorized representative who has indicated his/her understanding and acceptance.   Dental advisory given  Plan Discussed with: CRNA, Anesthesiologist and Surgeon  Anesthesia Plan Comments:         Anesthesia Quick Evaluation

## 2013-11-20 NOTE — Brief Op Note (Signed)
11/20/2013  12:35 PM  PATIENT:  Maude Leriche  80 y.o. male  PRE-OPERATIVE DIAGNOSIS:  RECURRENT LEFT CARPAL TUNNEL SYNDROME  POST-OPERATIVE DIAGNOSIS:  RECURRENT LEFT CARPAL TUNNEL SYNDROME  PROCEDURE:  Procedure(s): LEFT  CARPAL TUNNEL RELEASE (Left) HYPOTHENAR FAT PAD TRANSFER LEFT (Left)  SURGEON:  Surgeon(s) and Role:    * Wynonia Sours, MD - Primary    * Tennis Must, MD - Assisting  PHYSICIAN ASSISTANT:   ASSISTANTS:   K Avraj Lindroth,MD   ANESTHESIA:   local and regional  EBL:  Total I/O In: 500 [I.V.:500] Out: 30 [Urine:30]  BLOOD ADMINISTERED:none  DRAINS: none   LOCAL MEDICATIONS USED:  BUPIVICAINE   SPECIMEN:  No Specimen  DISPOSITION OF SPECIMEN:  N/A  COUNTS:  YES  TOURNIQUET:  * Missing tourniquet times found for documented tourniquets in log:  [id number] *  DICTATION: .Other Dictation: Dictation Number [id number]  PLAN OF CARE: Discharge to home after PACU  PATIENT DISPOSITION:  PACU - hemodynamically stable.

## 2013-11-20 NOTE — Anesthesia Procedure Notes (Signed)
Procedure Name: MAC Date/Time: 11/20/2013 11:40 AM Performed by: Chadric Kimberley Pre-anesthesia Checklist: Patient identified, Emergency Drugs available, Suction available, Patient being monitored and Timeout performed Patient Re-evaluated:Patient Re-evaluated prior to inductionOxygen Delivery Method: Simple face mask    Anesthesia Regional Block:  Bier block (IV Regional)  Pre-Anesthetic Checklist: ,, timeout performed, Correct Patient, Correct Site, Correct Laterality, Correct Procedure,, site marked, surgical consent,, at surgeon's request Needles:  Injection technique: Single-shot  Needle Type: Other      Needle Gauge: 20 and 20 G    Additional Needles: Bier block (IV Regional) Narrative:   Performed by: Personally

## 2013-11-20 NOTE — H&P (Signed)
[name] is a 80 year old husband of [name] a former patient. He is referred by Dr. Jannifer Franklin for a consultation with respect to pain and numbness in his left hand. This has been present for 5 years. He has recently had nerve conductions revealing a peripheral neuropathy and carpal tunnel syndrome. He does have diabetes. He has had bilateral carpal tunnel releases done several years ago. His motor delay is 4.3 on the right, 5.2 on the left, sensory delay of 4.4 on the left and 4.7 on the right. He has been wearing a splint during the day. He states it sometimes awakens him at night. He has had cervical fusions done multiple years ago. He does have a history of diabetes and arthritis. He complains of a severe sharp aching pain with a feeling of numbness and weakness. He states it is gradually getting worse. He is taking Oxycodone. He is not taking any anti-inflammatories. He is not complaining of his right side. He has had his ultrasound done by Dr. Zigmund Daniel revealing a compressed left median nerve just proximal to the carpal canal. The volume is .10 cubic cm. Within the carpal canal it is .05 cubic cm. On his right side it is .08 cubic cm proximal and .07 cubic cm in the carpal canal.  PAST MEDICAL HISTORY: He is allergic to Amoxicillin, CT scan dye, Biaxin, Apresoline, Hydralazine, tetracycline, HCTZ, Serzone and Levaquin. He is on the following medications: mirtazapine, Senokot, carvedilol, losartan, doxazosin, Januvia, Crestor, aspirin, polyethylene glycol, omeprazole, oxycodone, Lasix, multivitamins, calcium, finasteride, iron, B12, vitamin C, and sertraline. He has had the following surgeries: back surgery, eye surgery, fatty tumors removed from arms, prostate surgery times 3, veins in legs rerouted, hemorrhoid surgery, carpal tunnel releases, repair blood vessels in esophagus and stomach, bilateral kidney stents, small bowel obstruction, excision/release bursa hip, esophageal manometry, cervical  laminectomy.  FAMILY H ISTORY: Positive for arthritis otherwise negative.  SOCIAL HISTORY: He does not smoke or drink. He is married. He is in a wheelchair.   REVIEW OF SYSTEMS: Positive for contacts, high BP, kidney disease, balance problems, depression, easy bleeding and bruising, otherwise negative.  [name] is an 80 y.o. male.   Chief Complaint: Recurrent CTS left HPI: see above  Past Medical History  Diagnosis Date  . Kidney disease   . Diabetes mellitus   . Hypertension   . MI (myocardial infarction)     1974  . Peripheral vascular disease     veinsi rerouted in both legs   . Pneumonia     hx of   . Anemia   . Blood transfusion     hx of 6 months ago   . GERD (gastroesophageal reflux disease)   . Arthritis   . Bowel obstruction   . PONV (postoperative nausea and vomiting)   . Gait disorder   . Depression   . Dyslipidemia   . Heart disease (organic)   . C. difficile colitis   . History of GI bleed     Upper  . Lumbosacral spinal stenosis   . Memory deficit 11/29/2012  . Degenerative arthritis   . Peptic ulcer disease   . Wears glasses   . HOH (hard of hearing)     Past Surgical History  Procedure Laterality Date  . Back surgery      back surgery x 2   . Eye surgery      bilateral cataract surgery with implants   . Other surgical history  2 fatty tumors removed - on different arms   . Other surgical history      prostate surgery x 3   . Other surgical history      veins rerouted in both legs   . Other surgical history      hemorrhoid surgery  . Other surgical history      carpal tunnel right   . Other surgical history      big toe nails removed   . Other surgical history      repair of blood vessel in esophagus and stomach- 2006  . Other surgical history      bilateral kidney stents  2007  . Other surgical history      small bowel obstruction 2011   . Excision/release bursa hip  08/13/2011    Procedure: EXCISION/RELEASE BURSA HIP;   Surgeon: Tobi Bastos, MD;  Location: [facility];  Service: Orthopedics;  Laterality: Right;  Right Hip Excision Tumor  . Esophageal manometry  11/08/2011    Procedure: ESOPHAGEAL MANOMETRY (EM);  Surgeon: Beryle Beams, MD;  Location: [facility];  Service: Endoscopy;  Laterality: N/A;  . Hemorrhoid surgery    . Cataract extraction Bilateral   . Lipoma resection    . Transurethral resection of prostate      For benign prostate hypertrophy  . Carpal tunnel release Bilateral   . Ureteral stent placement Bilateral   . Cervical laminectomy    . Skin cancer resection      Left forehead  . Right hip surgery    . Femoral-popliteal bypass graft Left 07-08-1997    Curt Jews MD  . Femoral-popliteal bypass graft Right 06-04-1997    Curt Jews MD    Family History  Problem Relation Age of Onset  . Cancer Sister    Social History:  reports that he quit smoking about 41 years ago. He has never used smokeless tobacco. He reports that he does not drink alcohol or use illicit drugs.  Allergies:  Allergies  Allergen Reactions  . Amoxicillin Swelling  . Apresoline Esidrix [Hydralazine-Hctz] Itching  . Clarithromycin Itching    No prescriptions prior to admission    No results found for this or any previous visit (from the past 48 hour(s)).  No results found.   Pertinent items are noted in HPI.  Height 5\' 9"  (1.753 m), weight 80.287 kg (177 lb).  General appearance: alert, cooperative and appears stated age Head: Normocephalic, without obvious abnormality Neck: no JVD Resp: clear to auscultation bilaterally inspiratory wheeze Cardio: regular rate and rhythm, S1, S2 normal, no murmur, click, rub or gallop GI: soft, non-tender; bowel sounds normal; no masses,  no organomegaly Extremities: extremities normal, atraumatic, no cyanosis or edema Pulses: 2+ and symmetric Skin: Skin color, texture, turgor normal. No rashes or lesions Neurologic: Grossly  normal Incision/Wound: na  Assessment/Plan X-rays reveal very significant degenerative changes at the STT joint, CMC joint, MP joints of the thumb, index and middle, PIP joints of all his fingers, IP joint of his thumb, the DIP joints of all fingers. Angulatory deformities are present to each of the PIP joints with a flexion deformity to the DIP joint of his middle.   Diagnosis: s/p carpal tunnel releases with positive nerve conductions and positive ultrasound.  We have discussed with him the possibility of re-release along with thenar fat pad transfer on his left side. I would not recommend intervention on the right side. The pre, peri and post op course are discussed along  with risks and complications.  He is aware there is no guarantee with surgery, possibility of infection, recurrence, injury to arteries, nerves and tendons, incomplete relief of symptoms and dystrophy.  He is aware there may be no significant relief of his symptoms but he would like to undergo this.  Wynonia Sours 11/20/2013, 8:30 AM

## 2013-11-21 ENCOUNTER — Encounter (HOSPITAL_BASED_OUTPATIENT_CLINIC_OR_DEPARTMENT_OTHER): Payer: Self-pay | Admitting: Orthopedic Surgery

## 2013-11-21 NOTE — Op Note (Signed)
NAMEHIRAN, LEARD NO.:  [id number]  MEDICAL RECORD NO.:  [id number]  LOCATION:                                 FACILITY:  PHYSICIAN:  Daryll Brod, M.D.       DATE OF BIRTH:  05-14-1921  DATE OF PROCEDURE:  11/20/2013 DATE OF DISCHARGE:                              OPERATIVE REPORT   PREOPERATIVE DIAGNOSIS:  Recurrent carpal tunnel syndrome, left wrist.  POSTOPERATIVE DIAGNOSIS:  Recurrent carpal tunnel syndrome, left wrist.  OPERATION:  Re-release of carpal canal with hypothenar fat pad transfer, left hand.  SURGEON:  Daryll Brod, MD  ASSISTANT:  Leanora Cover, MD  ANESTHESIA:  Upper arm IV regional with local infiltration.  ANESTHESIOLOGIST:  Lorrene Reid, M.D.  HISTORY:  The patient is a 80 year old male with a history of carpal tunnel release in the past.  He has had recurrence of symptoms on nerve conductions are positive on his left side with ultrasound showing an enlarged nerve.  He was admitted for re-release with hypothenar fat pad transfer.  Pre, peri, and postoperative course have been discussed along with risks and complications.  He is aware that there is no guarantee with the surgery, possibility of infection; recurrence of injury to arteries, nerves, tendons; incomplete relief of symptoms, dystrophy.  In the preoperative area, the patient is seen, the extremity marked by both patient and surgeon.  Antibiotic given.  PROCEDURE IN DETAIL:  The patient was brought to the operating room, where an upper arm IV regional anesthetic was carried out without difficulty under the direction of Dr. Al Corpus.  He was prepped using ChloraPrep, supine position with the left arm free.  A 3-minute dry time was allowed.  Time-out taken, confirming the patient and procedure.  The old incision was used, extended proximally and distally, carried down through subcutaneous tissue.  Bleeders were electrocauterized with bipolar.  The nerve was identified proximal to  the old incision.  This was then traced distally through the carpal canal releasing the flexor retinaculum, taking care to protect neurovascular bundle and portions of the nerve.  Nerve was found to be intact, scarred to the overlying flexor retinaculum.  This was dissected free.  An epineurolysis was performed.  The hypothenar fat was then harvested.  This was done with blunt and sharp dissection, removing the fat from beneath the hypothenar skin.  This was left attached to its radial border and taken care to maintain blood vessels.  Bleeders were electrocauterized as necessary. The fat was fully mobilized down to the hypothenar musculature.  This was then rotated all over to the carpal canal.  The wound was copiously irrigated with saline.  The hypothenar fat pad transfer was then sutured beneath the flexor retinaculum over the top of the nerve with interrupted 4-0 Vicryl sutures.  This was done over approximately 8 cm fully covering the nerve through the entire carpal canal.  The wound was again irrigated.  The skin was then closed with interrupted 4-0 Vicryl Rapide sutures.  Local infiltration with 0.25% Marcaine without epinephrine was given, approximately 9 mL was used.  A sterile compressive dressing and volar wrist splint were applied.  On deflation of the  tourniquet, all fingers immediately pinked.  He was taken to the recovery room for observation in satisfactory condition.  He will be discharged home to return to the [facility] in 1 week on Percocet which he has at home.          ______________________________ Daryll Brod, M.D.     GK/MEDQ  D:  11/20/2013  T:  11/21/2013  Job:  [id number]

## 2013-12-05 ENCOUNTER — Encounter (INDEPENDENT_AMBULATORY_CARE_PROVIDER_SITE_OTHER): Payer: Self-pay

## 2013-12-05 ENCOUNTER — Encounter: Payer: Self-pay | Admitting: Neurology

## 2013-12-05 ENCOUNTER — Ambulatory Visit (INDEPENDENT_AMBULATORY_CARE_PROVIDER_SITE_OTHER): Payer: Medicare Other | Admitting: Neurology

## 2013-12-05 VITALS — BP 124/60 | HR 60 | Wt 179.0 lb

## 2013-12-05 DIAGNOSIS — R413 Other amnesia: Secondary | ICD-10-CM

## 2013-12-05 DIAGNOSIS — M48061 Spinal stenosis, lumbar region without neurogenic claudication: Secondary | ICD-10-CM

## 2013-12-05 DIAGNOSIS — R269 Unspecified abnormalities of gait and mobility: Secondary | ICD-10-CM

## 2013-12-05 NOTE — Patient Instructions (Signed)

## 2013-12-05 NOTE — Progress Notes (Signed)
Reason for visit: Gait disorder  [name] is an 80 y.o. male  History of present illness:  [name] is a 80 year old right-handed white male with a history of diabetes and a significant diabetic peripheral neuropathy associated with a gait disorder. He has bilateral foot drops, but he does not use AFO braces. He has a walker at home, and he is able to ambulate short distances. For long-distance travel, he uses a walker. He indicates that he is not sleeping well as he is up using the bathroom 3 or more times a night. He has a bedside commode and a urinal, but he does not use it. He sleeps during the daytime. He has had one fall since last seen around Easter, but he did not sustain injury. He indicates that the physical therapy he had about 6 months ago was helpful, but he has had somewhat of the back-slide since then. He has difficulty arising from a seated position, but he does have a lift chair at home. He returns for an evaluation. The patient indicates that there has been essentially no change in his memory since last seen.  Past Medical History  Diagnosis Date  . Kidney disease   . Diabetes mellitus   . Hypertension   . MI (myocardial infarction)     1974  . Peripheral vascular disease     veinsi rerouted in both legs   . Pneumonia     hx of   . Anemia   . Blood transfusion     hx of 6 months ago   . GERD (gastroesophageal reflux disease)   . Arthritis   . Bowel obstruction   . PONV (postoperative nausea and vomiting)   . Gait disorder   . Depression   . Dyslipidemia   . Heart disease (organic)   . C. difficile colitis   . History of GI bleed     Upper  . Lumbosacral spinal stenosis   . Memory deficit 11/29/2012  . Degenerative arthritis   . Peptic ulcer disease   . Wears glasses   . HOH (hard of hearing)     Past Surgical History  Procedure Laterality Date  . Back surgery      back surgery x 2   . Eye surgery      bilateral cataract surgery with  implants   . Other surgical history      2 fatty tumors removed - on different arms   . Other surgical history      prostate surgery x 3   . Other surgical history      veins rerouted in both legs   . Other surgical history      hemorrhoid surgery  . Other surgical history      carpal tunnel right   . Other surgical history      big toe nails removed   . Other surgical history      repair of blood vessel in esophagus and stomach- 2006  . Other surgical history      bilateral kidney stents  2007  . Other surgical history      small bowel obstruction 2011   . Excision/release bursa hip  08/13/2011    Procedure: EXCISION/RELEASE BURSA HIP;  Surgeon: Tobi Bastos, MD;  Location: [facility];  Service: Orthopedics;  Laterality: Right;  Right Hip Excision Tumor  . Esophageal manometry  11/08/2011    Procedure: ESOPHAGEAL MANOMETRY (EM);  Surgeon: Beryle Beams, MD;  Location:  [facility];  Service: Endoscopy;  Laterality: N/A;  . Hemorrhoid surgery    . Cataract extraction Bilateral   . Lipoma resection    . Transurethral resection of prostate      For benign prostate hypertrophy  . Carpal tunnel release Bilateral   . Ureteral stent placement Bilateral   . Cervical laminectomy    . Skin cancer resection      Left forehead  . Right hip surgery    . Femoral-popliteal bypass graft Left 07-08-1997    Curt Jews MD  . Femoral-popliteal bypass graft Right 06-04-1997    Curt Jews MD  . Carpal tunnel release Left 11/20/2013    Procedure: LEFT  CARPAL TUNNEL RELEASE;  Surgeon: Wynonia Sours, MD;  Location: [place];  Service: Orthopedics;  Laterality: Left;  . Hypothenar fat pad transfer Left 11/20/2013    Procedure: HYPOTHENAR FAT PAD TRANSFER LEFT;  Surgeon: Wynonia Sours, MD;  Location: [place];  Service: Orthopedics;  Laterality: Left;    Family History  Problem Relation Age of Onset  . Cancer Sister     Social history:  reports that he quit smoking  about 41 years ago. He has never used smokeless tobacco. He reports that he does not drink alcohol or use illicit drugs.    Allergies  Allergen Reactions  . Amoxicillin Swelling  . Apresoline Esidrix [Hydralazine-Hctz] Itching  . Clarithromycin Itching    Medications:  Current Outpatient Prescriptions on File Prior to Visit  Medication Sig Dispense Refill  . aspirin EC 81 MG tablet Take 81 mg by mouth daily before breakfast.       . calcium-vitamin D (OSCAL WITH D) 500-200 MG-UNIT per tablet Take 2 tablets by mouth daily.      . carvedilol (COREG) 25 MG tablet Take 25 mg by mouth 2 (two) times daily with a meal.      . desoximetasone (TOPICORT) 0.25 % cream Apply 1 application topically 2 (two) times daily as needed. For legs (itching)      . doxazosin (CARDURA) 4 MG tablet Take 4 mg by mouth 2 (two) times daily.       . ferrous sulfate 325 (65 FE) MG tablet Take 325 mg by mouth daily with breakfast.      . finasteride (PROSCAR) 5 MG tablet Take 5 mg by mouth daily.      . furosemide (LASIX) 40 MG tablet Take 40 mg by mouth 3 (three) times daily.       Marland Kitchen losartan (COZAAR) 100 MG tablet Take 50 mg by mouth daily.       . mirtazapine (REMERON) 15 MG tablet Take 15 mg by mouth at bedtime.      . Multiple Vitamin (MULITIVITAMIN WITH MINERALS) TABS Take 1 tablet by mouth daily before breakfast.       . Omeprazole (PRILOSEC PO) Take 20 mg by mouth daily.       Marland Kitchen oxyCODONE-acetaminophen (PERCOCET) 7.5-325 MG per tablet Take 1 tablet by mouth every 6 (six) hours as needed for pain. P/U  90 tablet  0  . polyethylene glycol (MIRALAX / GLYCOLAX) packet Take 17 g by mouth daily before breakfast.       . rosuvastatin (CRESTOR) 10 MG tablet Take 10 mg by mouth daily.      Marland Kitchen senna (SENOKOT) 8.6 MG TABS Take 2 tablets by mouth at bedtime.      . sertraline (ZOLOFT) 25 MG tablet Take 25 mg by mouth  daily.      . sitaGLIPtin (JANUVIA) 50 MG tablet Take 50 mg by mouth daily before breakfast.       .  vitamin B-12 (CYANOCOBALAMIN) 1000 MCG tablet Take 1,000 mcg by mouth daily before breakfast.       . vitamin C (ASCORBIC ACID) 500 MG tablet Take 500 mg by mouth daily before breakfast.        No current facility-administered medications on file prior to visit.    ROS:  Out of a complete 14 system review of symptoms, the patient complains only of the following symptoms, and all other reviewed systems are negative.  Fatigue Difficulty swallowing Wheezing Leg swelling Cold intolerance Constipation Snoring Difficulty urinating, frequency of urination Joint pain, walking difficulties Itching Bruising easily Depression  Blood pressure 124/60, pulse 60, weight 179 lb (81.194 kg).  Physical Exam  General: The patient is alert and cooperative at the time of the examination.  Skin: 1+ edema of ankles is noted bilaterally.   Neurologic Exam  Mental status: The Mini-Mental status examination done today shows a total score of 26/30.  Cranial nerves: Facial symmetry is present. Speech is normal, no aphasia or dysarthria is noted. Extraocular movements are full. Visual fields are full.  Motor: The patient has good strength in all 4 extremities, with exception that there are bilateral foot drops.  Sensory examination: Soft touch sensation is symmetric on the face, arms, and legs.  Coordination: The patient has good finger-nose-finger and heel-to-shin bilaterally.  Gait and station: The patient requires assistance with standing. Once up, he has a wide-based unsteady gait. Tandem gait was not attempted. Romberg is unsteady, the patient has a tendency to fall backwards. No drift is seen. The patient is in a wheelchair today.  Reflexes: Deep tendon reflexes are symmetric, but are depressed to absent.   Assessment/Plan:  1. Gait disorder  2. Peripheral neuropathy  3. Bilateral foot drops  4. Memory disturbance  The patient is doing fairly well with his cognitive functioning at  this time. He has had some back-slide in his ability to ambulate. He gains benefit with physical therapy previously, we will reorder home health therapy. He will need to focus on improving safety in the home environment to prevent falls. The patient will followup in 6 months.  Jill Alexanders MD 12/05/2013 5:13 PM  [facility] [address] [place],  [address]  Phone [phone] Fax [phone]

## 2013-12-24 ENCOUNTER — Other Ambulatory Visit: Payer: Self-pay | Admitting: *Deleted

## 2013-12-24 MED ORDER — OXYCODONE-ACETAMINOPHEN 7.5-325 MG PO TABS: 1.0000 | ORAL_TABLET | Freq: Four times a day (QID) | ORAL | Status: DC | PRN

## 2013-12-24 NOTE — Telephone Encounter (Signed)
Dr Willis is out of the office.  Forwarding request to [facility] for approval.  

## 2013-12-24 NOTE — Telephone Encounter (Signed)
Called pt and left message informing pt that his Rx was ready to be picked up at the front desk and if he has any other problems, questions or concerns to call the office.

## 2014-01-22 ENCOUNTER — Other Ambulatory Visit: Payer: Self-pay | Admitting: Neurology

## 2014-01-22 MED ORDER — OXYCODONE-ACETAMINOPHEN 7.5-325 MG PO TABS: 1.0000 | ORAL_TABLET | Freq: Four times a day (QID) | ORAL | Status: DC | PRN

## 2014-01-22 NOTE — Telephone Encounter (Signed)
Called pt and spoke with pt's wife [name] informing her that the pt's Rx was ready to be picked up at the front desk and if she has any other problems, questions or concerns to call the office. Wife verbalized understanding.

## 2014-01-22 NOTE — Telephone Encounter (Signed)
Patient's wife requesting refill for patient's Percocet written script to pick up.

## 2014-01-22 NOTE — Telephone Encounter (Signed)
Request forwarded to provider for approval  

## 2014-02-01 ENCOUNTER — Emergency Department (HOSPITAL_COMMUNITY): Payer: Medicare Other

## 2014-02-01 ENCOUNTER — Encounter (HOSPITAL_COMMUNITY): Payer: Self-pay | Admitting: Emergency Medicine

## 2014-02-01 ENCOUNTER — Emergency Department (HOSPITAL_COMMUNITY)
Admission: EM | Admit: 2014-02-01 | Discharge: 2014-02-01 | Disposition: A | Discharge status: Home / Self Care | Payer: Medicare Other | Attending: Emergency Medicine | Admitting: Emergency Medicine

## 2014-02-01 DIAGNOSIS — Z8719 Personal history of other diseases of the digestive system: Secondary | ICD-10-CM | POA: Insufficient documentation

## 2014-02-01 DIAGNOSIS — E119 Type 2 diabetes mellitus without complications: Secondary | ICD-10-CM | POA: Insufficient documentation

## 2014-02-01 DIAGNOSIS — Z79899 Other long term (current) drug therapy: Secondary | ICD-10-CM | POA: Diagnosis not present

## 2014-02-01 DIAGNOSIS — Z7982 Long term (current) use of aspirin: Secondary | ICD-10-CM | POA: Insufficient documentation

## 2014-02-01 DIAGNOSIS — I10 Essential (primary) hypertension: Secondary | ICD-10-CM | POA: Insufficient documentation

## 2014-02-01 DIAGNOSIS — E785 Hyperlipidemia, unspecified: Secondary | ICD-10-CM | POA: Diagnosis not present

## 2014-02-01 DIAGNOSIS — Z8701 Personal history of pneumonia (recurrent): Secondary | ICD-10-CM | POA: Diagnosis not present

## 2014-02-01 DIAGNOSIS — F3289 Other specified depressive episodes: Secondary | ICD-10-CM | POA: Diagnosis not present

## 2014-02-01 DIAGNOSIS — I739 Peripheral vascular disease, unspecified: Secondary | ICD-10-CM | POA: Insufficient documentation

## 2014-02-01 DIAGNOSIS — Z880 Allergy status to penicillin: Secondary | ICD-10-CM | POA: Insufficient documentation

## 2014-02-01 DIAGNOSIS — Z8669 Personal history of other diseases of the nervous system and sense organs: Secondary | ICD-10-CM | POA: Insufficient documentation

## 2014-02-01 DIAGNOSIS — Z87448 Personal history of other diseases of urinary system: Secondary | ICD-10-CM | POA: Diagnosis not present

## 2014-02-01 DIAGNOSIS — I252 Old myocardial infarction: Secondary | ICD-10-CM | POA: Insufficient documentation

## 2014-02-01 DIAGNOSIS — R109 Unspecified abdominal pain: Secondary | ICD-10-CM | POA: Insufficient documentation

## 2014-02-01 DIAGNOSIS — I519 Heart disease, unspecified: Secondary | ICD-10-CM | POA: Diagnosis not present

## 2014-02-01 DIAGNOSIS — K5900 Constipation, unspecified: Secondary | ICD-10-CM | POA: Insufficient documentation

## 2014-02-01 DIAGNOSIS — Z87891 Personal history of nicotine dependence: Secondary | ICD-10-CM | POA: Insufficient documentation

## 2014-02-01 DIAGNOSIS — D649 Anemia, unspecified: Secondary | ICD-10-CM | POA: Insufficient documentation

## 2014-02-01 DIAGNOSIS — M129 Arthropathy, unspecified: Secondary | ICD-10-CM | POA: Insufficient documentation

## 2014-02-01 DIAGNOSIS — F329 Major depressive disorder, single episode, unspecified: Secondary | ICD-10-CM | POA: Insufficient documentation

## 2014-02-01 LAB — BASIC METABOLIC PANEL
Anion gap: 13 (ref 5–15)
BUN: 45 mg/dL — ABNORMAL HIGH (ref 6–23)
CO2: 27 mEq/L (ref 19–32)
Calcium: 9 mg/dL (ref 8.4–10.5)
Chloride: 97 mEq/L (ref 96–112)
Creatinine, Ser: 1.57 mg/dL — ABNORMAL HIGH (ref 0.50–1.35)
GFR calc Af Amer: 43 mL/min — ABNORMAL LOW (ref >90)
GFR calc non Af Amer: 37 mL/min — ABNORMAL LOW (ref >90)
Glucose, Bld: 136 mg/dL — ABNORMAL HIGH (ref 70–99)
Potassium: 4.4 mEq/L (ref 3.7–5.3)
Sodium: 137 mEq/L (ref 137–147)

## 2014-02-01 LAB — CBC
HCT: 31.8 % — ABNORMAL LOW (ref 39.0–52.0)
Hemoglobin: 10.3 g/dL — ABNORMAL LOW (ref 13.0–17.0)
MCH: 28.5 pg (ref 26.0–34.0)
MCHC: 32.4 g/dL (ref 30.0–36.0)
MCV: 87.8 fL (ref 78.0–100.0)
Platelets: 156 10*3/uL (ref 150–400)
RBC: 3.62 MIL/uL — ABNORMAL LOW (ref 4.22–5.81)
RDW: 14.8 % (ref 11.5–15.5)
WBC: 9 10*3/uL (ref 4.0–10.5)

## 2014-02-01 MED ORDER — FLEET ENEMA 7-19 GM/118ML RE ENEM
1.0000 | ENEMA | Freq: Once | RECTAL | Status: AC
  Administered 2014-02-01: 1 via RECTAL
  Filled 2014-02-01: qty 1

## 2014-02-01 NOTE — Discharge Instructions (Signed)
Continue taking home stool softeners and using enemas as prescribed. Be sure to call your family doctor for further evaluation and treatment of chronic constipation.

## 2014-02-01 NOTE — ED Notes (Signed)
Pt reports constipation x 1 week and "my stomach hurts and it's real tight." Pt denies n/v.  Pt has hx of constipation and intestinal blockage. Pt alert and oriented.

## 2014-02-01 NOTE — ED Notes (Signed)
Pt reports taking 3 percocet a day.

## 2014-02-01 NOTE — ED Notes (Signed)
Patient is alert and oriented x3.  He was given DC instructions and follow up visit instructions.  Patient gave verbal understanding.  He was DC ambulatory under his own power to home.  V/S stable.  He was not showing any signs of distress on DC 

## 2014-02-01 NOTE — ED Provider Notes (Signed)
CSN: [id number]     Arrival date & time 02/01/14  1437 History   First MD Initiated Contact with Patient 02/01/14 1604     No chief complaint on file.    (Consider location/radiation/quality/duration/timing/severity/associated sxs/prior Treatment) HPI Pt is a 80yo male with hx of intestinal blockage presenting to ED with c/o about 4 days of constipation with gradually worsening tightness sensation in his abdomen.  Pt states "my stomach hurts and it;s real tight."   Pain is constant tightness in abdomen, 5/10 at this time.  Pt does have hx of intestinal blockage but wife states that was about 2 years ago and he never required surgery for the blockage, he was just admitted for treatment. No previous hx of blockage. Denies fever, n/v/d.    Past Medical History  Diagnosis Date  . Kidney disease   . Diabetes mellitus   . Hypertension   . MI (myocardial infarction)     1974  . Peripheral vascular disease     veinsi rerouted in both legs   . Pneumonia     hx of   . Anemia   . Blood transfusion     hx of 6 months ago   . GERD (gastroesophageal reflux disease)   . Arthritis   . Bowel obstruction   . PONV (postoperative nausea and vomiting)   . Gait disorder   . Depression   . Dyslipidemia   . Heart disease (organic)   . C. difficile colitis   . History of GI bleed     Upper  . Lumbosacral spinal stenosis   . Memory deficit 11/29/2012  . Degenerative arthritis   . Peptic ulcer disease   . Wears glasses   . HOH (hard of hearing)    Past Surgical History  Procedure Laterality Date  . Back surgery      back surgery x 2   . Eye surgery      bilateral cataract surgery with implants   . Other surgical history      2 fatty tumors removed - on different arms   . Other surgical history      prostate surgery x 3   . Other surgical history      veins rerouted in both legs   . Other surgical history      hemorrhoid surgery  . Other surgical history      carpal tunnel right   .  Other surgical history      big toe nails removed   . Other surgical history      repair of blood vessel in esophagus and stomach- 2006  . Other surgical history      bilateral kidney stents  2007  . Other surgical history      small bowel obstruction 2011   . Excision/release bursa hip  08/13/2011    Procedure: EXCISION/RELEASE BURSA HIP;  Surgeon: Tobi Bastos, MD;  Location: [facility];  Service: Orthopedics;  Laterality: Right;  Right Hip Excision Tumor  . Esophageal manometry  11/08/2011    Procedure: ESOPHAGEAL MANOMETRY (EM);  Surgeon: Beryle Beams, MD;  Location: [facility];  Service: Endoscopy;  Laterality: N/A;  . Hemorrhoid surgery    . Cataract extraction Bilateral   . Lipoma resection    . Transurethral resection of prostate      For benign prostate hypertrophy  . Carpal tunnel release Bilateral   . Ureteral stent placement Bilateral   . Cervical laminectomy    . Skin cancer  resection      Left forehead  . Right hip surgery    . Femoral-popliteal bypass graft Left 07-08-1997    Curt Jews MD  . Femoral-popliteal bypass graft Right 06-04-1997    Curt Jews MD  . Carpal tunnel release Left 11/20/2013    Procedure: LEFT  CARPAL TUNNEL RELEASE;  Surgeon: Wynonia Sours, MD;  Location: [facility];  Service: Orthopedics;  Laterality: Left;  . Hypothenar fat pad transfer Left 11/20/2013    Procedure: HYPOTHENAR FAT PAD TRANSFER LEFT;  Surgeon: Wynonia Sours, MD;  Location: [facility];  Service: Orthopedics;  Laterality: Left;   Family History  Problem Relation Age of Onset  . Cancer Sister    History  Substance Use Topics  . Smoking status: Former Smoker    Quit date: 07/22/1972  . Smokeless tobacco: Never Used  . Alcohol Use: No    Review of Systems  Constitutional: Negative for fever and chills.  Cardiovascular: Negative for chest pain and palpitations.  Gastrointestinal: Positive for abdominal pain ("it's real tight" ), constipation and  abdominal distention. Negative for nausea, vomiting, diarrhea and blood in stool.  Genitourinary: Negative for dysuria, urgency, hematuria, flank pain and decreased urine volume.  All other systems reviewed and are negative.     Allergies  Amoxicillin; Apresoline esidrix; and Clarithromycin  Home Medications   Prior to Admission medications   Medication Sig Start Date End Date Taking? Authorizing Provider  aspirin EC 81 MG tablet Take 81 mg by mouth daily before breakfast.    Yes Historical Provider, MD  carvedilol (COREG) 25 MG tablet Take 25 mg by mouth 2 (two) times daily with a meal.   Yes Historical Provider, MD  doxazosin (CARDURA) 4 MG tablet Take 4 mg by mouth 2 (two) times daily.    Yes Historical Provider, MD  ferrous sulfate 325 (65 FE) MG tablet Take 325 mg by mouth daily with breakfast.   Yes Historical Provider, MD  finasteride (PROSCAR) 5 MG tablet Take 5 mg by mouth daily. 11/20/12  Yes Historical Provider, MD  furosemide (LASIX) 40 MG tablet Take 40 mg by mouth 3 (three) times daily.    Yes Historical Provider, MD  Linaclotide (LINZESS) 145 MCG CAPS capsule Take 145 mcg by mouth at bedtime.   Yes Historical Provider, MD  losartan (COZAAR) 100 MG tablet Take 50 mg by mouth daily.    Yes Historical Provider, MD  mirtazapine (REMERON) 15 MG tablet Take 15 mg by mouth at bedtime.   Yes Historical Provider, MD  Multiple Vitamin (MULITIVITAMIN WITH MINERALS) TABS Take 1 tablet by mouth daily before breakfast.    Yes Historical Provider, MD  oxyCODONE-acetaminophen (PERCOCET) 7.5-325 MG per tablet Take 1 tablet by mouth every 6 (six) hours as needed for pain.   Yes Historical Provider, MD  polyethylene glycol (MIRALAX / GLYCOLAX) packet Take 17 g by mouth daily before breakfast.    Yes Historical Provider, MD  rosuvastatin (CRESTOR) 10 MG tablet Take 10 mg by mouth daily.   Yes Historical Provider, MD  sertraline (ZOLOFT) 25 MG tablet Take 25 mg by mouth daily. 11/21/12  Yes  Historical Provider, MD  vitamin B-12 (CYANOCOBALAMIN) 1000 MCG tablet Take 1,000 mcg by mouth daily before breakfast.    Yes Historical Provider, MD  vitamin C (ASCORBIC ACID) 500 MG tablet Take 500 mg by mouth daily before breakfast.    Yes Historical Provider, MD  sitaGLIPtin (JANUVIA) 50 MG tablet Take 50 mg  by mouth daily before breakfast.     Historical Provider, MD   BP 157/59  Pulse 60  Temp(Src) 98.1 F (36.7 C) (Oral)  Resp 18  SpO2 99% Physical Exam  Nursing note and vitals reviewed. Constitutional: He appears well-developed and well-nourished.  Lying comfortably in exam bed, NAD  HENT:  Head: Normocephalic and atraumatic.  Eyes: Conjunctivae are normal. No scleral icterus.  Neck: Normal range of motion.  Cardiovascular: Normal rate, regular rhythm and normal heart sounds.   Pulmonary/Chest: Effort normal and breath sounds normal. No respiratory distress. He has no wheezes. He has no rales. He exhibits no tenderness.  Abdominal: Soft. Bowel sounds are normal. He exhibits distension. He exhibits no mass. There is no tenderness. There is no rebound and no guarding.  Normal bowel sounds, soft, mild distension. Non-tender.   Genitourinary:  Chaperoned exam: rectal exam: no fecal impaction  Musculoskeletal: Normal range of motion.  Neurological: He is alert.  Skin: Skin is warm and dry.    ED Course  Procedures (including critical care time) Labs Review Labs Reviewed  CBC - Abnormal; Notable for the following:    RBC 3.62 (*)    Hemoglobin 10.3 (*)    HCT 31.8 (*)    All other components within normal limits  BASIC METABOLIC PANEL - Abnormal; Notable for the following:    Glucose, Bld 136 (*)    BUN 45 (*)    Creatinine, Ser 1.57 (*)    GFR calc non Af Amer 37 (*)    GFR calc Af Amer 43 (*)    All other components within normal limits    Imaging Review Ct Abdomen Pelvis Wo Contrast  02/01/2014   CLINICAL DATA:  Abdominal pain. Constipation. Question small bowel  obstruction.  EXAM: CT ABDOMEN AND PELVIS WITHOUT CONTRAST  TECHNIQUE: Multidetector CT imaging of the abdomen and pelvis was performed following the standard protocol without IV contrast.  COMPARISON:  CT abdomen and pelvis 12/19/2011.  FINDINGS: Mild atelectasis is seen in the lung bases. There is no pleural or pericardial effusion.  Small hiatal hernia is noted. The stomach is otherwise unremarkable. The small bowel and appendix appear normal. Sigmoid diverticulosis without diverticulitis is identified. Aortoiliac atherosclerosis without aneurysm is seen.  Scattered hepatic cysts are unchanged. Gallstones are seen but there is no evidence of cholecystitis. The adrenal glands and pancreas appear normal. Calcifications in the spleen consistent with old granulomatous disease are unchanged. Calcifications are seen in the kidneys are likely vascular. There is no hydronephrosis. No lymphadenopathy or fluid is identified.  There is atrophy of the gluteal musculature bilaterally, unchanged. Lumbar spondylosis is noted. No lytic or sclerotic bony lesion is seen.  IMPRESSION: Negative for small bowel obstruction. There is no acute abnormality.  Sigmoid diverticulosis without diverticulitis.  Atherosclerosis.  Gallstones without evidence of cholecystitis.  Small hiatal hernia.   Electronically Signed   By: Inge Rise M.D.   On: 02/01/2014 18:46     EKG Interpretation None      MDM   Final diagnoses:  Constipation, unspecified constipation type  Abdominal discomfort    Pt is a 80yo male with hx of intestinal obstruction presenting to ED with c/o abdominal discomfort and constipation for at least 4 days.  Abdomen is mildly distended, but normal bowel sounds, soft and non-tender. Pt appears well, non-toxic, afebrile. Rectal exam: no fecal impaction.  Enema attempted in ED w/o passage of stool.  CT abd: unremarkable, no evidence of SBO or other emergent process  taking place at this time. Discussed pt with  Dr. Colin Rhein who also examined pt.  Pt may be discharge home to f/u with his PCP next week for recheck of symptoms. Return precautions provided. Pt and wife verbalized understanding and agreement with tx plan.     Noland Fordyce, PA-C 02/01/14 1919

## 2014-02-02 NOTE — ED Provider Notes (Signed)
Briefly, pt is a 80 y.o. male presenting with abd discomfort and constipation.  I performed an examination on the patient including cardiac, pulmonary, which were unremarkable.  His Abd was remarkable for mild distension and very mild generalized tenderness.  CT scan obtained due to ho SBO and unremarkable.  No fecal impaction on exam.  Likely constipation.  Will have pt take miralax and fu with PCP  Medical screening examination/treatment/procedure(s) were conducted as a shared visit with non-physician practitioner(s) and myself.  I personally evaluated the patient during the encounter.   EKG Interpretation None        Debby Freiberg, MD 02/02/14 1556

## 2014-02-21 ENCOUNTER — Telehealth: Payer: Self-pay | Admitting: *Deleted

## 2014-02-21 MED ORDER — OXYCODONE-ACETAMINOPHEN 7.5-325 MG PO TABS: 1.0000 | ORAL_TABLET | Freq: Four times a day (QID) | ORAL | Status: DC | PRN

## 2014-02-21 NOTE — Telephone Encounter (Signed)
Wife calling for refill on oxycodone refill for pt.  Will be in [place], [place] tomorrow for another doctor's appt wanted to pick up then.

## 2014-02-21 NOTE — Telephone Encounter (Signed)
I called the wife. A prescription has been written, will be ready to pick up.

## 2014-02-22 NOTE — Telephone Encounter (Signed)
Called pt and spoke with pt's wife Vickii Chafe informing her that the pt's Rx was ready to be picked up at the front desk and if the pt has any other problems, questions or concerns to call the office. Wife verbalized understanding.

## 2014-03-19 ENCOUNTER — Other Ambulatory Visit: Payer: Self-pay | Admitting: Neurology

## 2014-03-19 ENCOUNTER — Other Ambulatory Visit: Payer: Self-pay | Admitting: Dermatology

## 2014-03-19 MED ORDER — OXYCODONE-ACETAMINOPHEN 7.5-325 MG PO TABS: 1.0000 | ORAL_TABLET | Freq: Four times a day (QID) | ORAL | Status: DC | PRN

## 2014-03-19 NOTE — Telephone Encounter (Signed)
Patient's wife calling on patient's behalf to request Percocet refill, please return call and advise, patient's wife states that she will be in [facility] tomorrow.

## 2014-03-20 NOTE — Telephone Encounter (Signed)
Called pt and left message informing him that his Rx was ready to be picked up at the front desk and if he has any other problems, questions or concerns to call the office. °

## 2014-04-22 ENCOUNTER — Other Ambulatory Visit: Payer: Self-pay | Admitting: Neurology

## 2014-04-22 MED ORDER — OXYCODONE-ACETAMINOPHEN 7.5-325 MG PO TABS: 1.0000 | ORAL_TABLET | Freq: Four times a day (QID) | ORAL | Status: DC | PRN

## 2014-04-22 NOTE — Telephone Encounter (Signed)
Request entered, forwarded to provider for approval.  

## 2014-04-22 NOTE — Telephone Encounter (Signed)
Patient spouse calling for Rx refill for oxyCODONE-acetaminophen (PERCOCET) 7.5-325 MG per tablet.  Please call when ready for pick up.

## 2014-04-22 NOTE — Telephone Encounter (Signed)
I called the patient to let them know their Rx for Oxycodone was ready for pickup. Patient was instructed to bring Photo ID. 

## 2014-05-08 ENCOUNTER — Encounter: Payer: Self-pay | Admitting: Neurology

## 2014-05-14 ENCOUNTER — Encounter: Payer: Self-pay | Admitting: Neurology

## 2014-06-07 ENCOUNTER — Ambulatory Visit: Payer: Medicare Other | Admitting: Neurology

## 2014-06-26 ENCOUNTER — Encounter: Payer: Self-pay | Admitting: Neurology

## 2014-06-26 ENCOUNTER — Ambulatory Visit (INDEPENDENT_AMBULATORY_CARE_PROVIDER_SITE_OTHER): Payer: Medicare Other | Admitting: Neurology

## 2014-06-26 VITALS — BP 127/59 | HR 61

## 2014-06-26 DIAGNOSIS — E1142 Type 2 diabetes mellitus with diabetic polyneuropathy: Secondary | ICD-10-CM | POA: Diagnosis not present

## 2014-06-26 DIAGNOSIS — R269 Unspecified abnormalities of gait and mobility: Secondary | ICD-10-CM

## 2014-06-26 DIAGNOSIS — R413 Other amnesia: Secondary | ICD-10-CM | POA: Diagnosis not present

## 2014-06-26 MED ORDER — OXYCODONE-ACETAMINOPHEN 7.5-325 MG PO TABS: 1.0000 | ORAL_TABLET | Freq: Four times a day (QID) | ORAL | Status: DC | PRN

## 2014-06-26 NOTE — Patient Instructions (Signed)

## 2014-06-26 NOTE — Progress Notes (Signed)
Reason for visit: Peripheral neuropathy  [name] is an 81 y.o. male  History of present illness:  Mr. [name] is a 81 year old right-handed white male with a severe peripheral neuropathy associated with a gait disorder. The patient has fallen on occasion, the last fall was 4 months ago. The patient has been taking low-dose of Percocet for the pain, but he has had some problems with constipation. Is now on Senokot daily to help this issue. He occasionally will have some headaches. He has a memory disorder, and he does not wish to go on any medications for memory. He denies that there has been a significant change in his cognitive functioning since last seen. The patient uses a walker to ambulate inside the house short distances, and he has a wheelchair to use outside the house. He does have some issues with discomfort in the legs and hands, he reports numbness of the left hand associated with carpal tunnel syndrome. He comes to this office for an evaluation.  Past Medical History  Diagnosis Date  . Kidney disease   . Diabetes mellitus   . Hypertension   . MI (myocardial infarction)     1974  . Peripheral vascular disease     veinsi rerouted in both legs   . Pneumonia     hx of   . Anemia   . Blood transfusion     hx of 6 months ago   . GERD (gastroesophageal reflux disease)   . Arthritis   . Bowel obstruction   . PONV (postoperative nausea and vomiting)   . Gait disorder   . Depression   . Dyslipidemia   . Heart disease (organic)   . C. difficile colitis   . History of GI bleed     Upper  . Lumbosacral spinal stenosis   . Memory deficit 11/29/2012  . Degenerative arthritis   . Peptic ulcer disease   . Wears glasses   . HOH (hard of hearing)     Past Surgical History  Procedure Laterality Date  . Back surgery      back surgery x 2   . Eye surgery      bilateral cataract surgery with implants   . Other surgical history      2 fatty tumors removed - on different  arms   . Other surgical history      prostate surgery x 3   . Other surgical history      veins rerouted in both legs   . Other surgical history      hemorrhoid surgery  . Other surgical history      carpal tunnel right   . Other surgical history      big toe nails removed   . Other surgical history      repair of blood vessel in esophagus and stomach- 2006  . Other surgical history      bilateral kidney stents  2007  . Other surgical history      small bowel obstruction 2011   . Excision/release bursa hip  08/13/2011    Procedure: EXCISION/RELEASE BURSA HIP;  Surgeon: Tobi Bastos, MD;  Location: [facility];  Service: Orthopedics;  Laterality: Right;  Right Hip Excision Tumor  . Esophageal manometry  11/08/2011    Procedure: ESOPHAGEAL MANOMETRY (EM);  Surgeon: Beryle Beams, MD;  Location: [facility];  Service: Endoscopy;  Laterality: N/A;  . Hemorrhoid surgery    . Cataract extraction Bilateral   . Lipoma  resection    . Transurethral resection of prostate      For benign prostate hypertrophy  . Carpal tunnel release Bilateral   . Ureteral stent placement Bilateral   . Cervical laminectomy    . Skin cancer resection      Left forehead  . Right hip surgery    . Femoral-popliteal bypass graft Left 07-08-1997    Curt Jews MD  . Femoral-popliteal bypass graft Right 06-04-1997    Curt Jews MD  . Carpal tunnel release Left 11/20/2013    Procedure: LEFT  CARPAL TUNNEL RELEASE;  Surgeon: Wynonia Sours, MD;  Location: [facility];  Service: Orthopedics;  Laterality: Left;  . Hypothenar fat pad transfer Left 11/20/2013    Procedure: HYPOTHENAR FAT PAD TRANSFER LEFT;  Surgeon: Wynonia Sours, MD;  Location: [facility];  Service: Orthopedics;  Laterality: Left;    Family History  Problem Relation Age of Onset  . Cancer Sister     Social history:  reports that he quit smoking about 41 years ago. He has never used smokeless tobacco. He reports that he does  not drink alcohol or use illicit drugs.    Allergies  Allergen Reactions  . Hctz [Hydrochlorothiazide] Other (See Comments)    hyponatremia  . Tetracyclines & Related   . Amoxicillin Swelling  . Apresoline Esidrix [Hydralazine-Hctz] Itching  . Clarithromycin Itching  . Levaquin [Levofloxacin In D5w]   . Serzone [Nefazodone] Other (See Comments)    Felt bad    Medications:  Current Outpatient Prescriptions on File Prior to Visit  Medication Sig Dispense Refill  . aspirin EC 81 MG tablet Take 81 mg by mouth daily before breakfast.     . carvedilol (COREG) 25 MG tablet Take 25 mg by mouth 2 (two) times daily with a meal.    . doxazosin (CARDURA) 4 MG tablet Take 4 mg by mouth 2 (two) times daily.     . ferrous sulfate 325 (65 FE) MG tablet Take 325 mg by mouth daily with breakfast.    . finasteride (PROSCAR) 5 MG tablet Take 5 mg by mouth daily.    . furosemide (LASIX) 40 MG tablet Take 40 mg by mouth 3 (three) times daily.     Marland Kitchen losartan (COZAAR) 100 MG tablet Take 50 mg by mouth daily.     . mirtazapine (REMERON) 15 MG tablet Take 15 mg by mouth at bedtime.    . Multiple Vitamin (MULITIVITAMIN WITH MINERALS) TABS Take 1 tablet by mouth daily before breakfast.     . rosuvastatin (CRESTOR) 10 MG tablet Take 10 mg by mouth daily.    . sertraline (ZOLOFT) 25 MG tablet Take 25 mg by mouth daily.    . vitamin B-12 (CYANOCOBALAMIN) 1000 MCG tablet Take 1,000 mcg by mouth daily before breakfast.     . vitamin C (ASCORBIC ACID) 500 MG tablet Take 500 mg by mouth daily before breakfast.      No current facility-administered medications on file prior to visit.    ROS:  Out of a complete 14 system review of symptoms, the patient complains only of the following symptoms, and all other reviewed systems are negative.  Fatigue Difficulty swallowing Eye itching Leg swelling Cold intolerance Constipation sleep apnea, snoring Food allergies Frequency of urination Muscle cramps  Itching  of the skin Anemia Dizziness, weakness Depression, suicidal thoughts    Blood pressure 127/59, pulse 61, height 0' (0 m), weight 0 lb (0 kg).  Physical  Exam  General: The patient is alert and cooperative at the time of the examination.  Skin: No significant peripheral edema is noted.   Neurologic Exam  Mental status: The Mini-Mental Status Examination done today shows a total score 21/30.  Cranial nerves: Facial symmetry is present. Speech is normal, no aphasia or dysarthria is noted. Extraocular movements are full. Visual fields are full.  Motor: The patient has good strength in all 4 extremities, with exception that there are bilateral foot drops.  Sensory examination: Soft touch sensation is symmetric on the face, arms, and legs. The patient has significant sensory decreased below the knees bilaterally.   Coordination: The patient has good finger-nose-finger and heel-to-shin bilaterally.  Gait and station: The patient can stand with assistance, he can walk with a wide-based gait with assistance. Tandem gait was not attempted.   Reflexes: Deep tendon reflexes are symmetric, but are depressed.   Assessment/Plan:  1. Memory disturbance  2. Peripheral neuropathy  3. Gait disturbance  The patient does not wish to go on medications for memory. He is taking low-dose Percocet for pain, and he is getting by with this. He indicates that he sleeps fairly well with exception that he has to get up frequently to use the bathroom, not because of pain. He will follow-up in 6 months. I stressed the need for safety with ambulation. The patient has a lift chair at home to help him get up to use his walker.   Jill Alexanders MD 06/26/2014 9:49 AM  [facility] [address] [place], [place] [address]  Phone [phone] Fax [phone]

## 2014-08-01 DIAGNOSIS — I10 Essential (primary) hypertension: Secondary | ICD-10-CM | POA: Diagnosis not present

## 2014-08-01 DIAGNOSIS — E780 Pure hypercholesterolemia: Secondary | ICD-10-CM | POA: Diagnosis not present

## 2014-08-06 DIAGNOSIS — E119 Type 2 diabetes mellitus without complications: Secondary | ICD-10-CM | POA: Diagnosis not present

## 2014-08-06 DIAGNOSIS — I10 Essential (primary) hypertension: Secondary | ICD-10-CM | POA: Diagnosis not present

## 2014-08-06 DIAGNOSIS — E1140 Type 2 diabetes mellitus with diabetic neuropathy, unspecified: Secondary | ICD-10-CM | POA: Diagnosis not present

## 2014-08-06 DIAGNOSIS — E780 Pure hypercholesterolemia: Secondary | ICD-10-CM | POA: Diagnosis not present

## 2014-08-20 DIAGNOSIS — D631 Anemia in chronic kidney disease: Secondary | ICD-10-CM | POA: Diagnosis not present

## 2014-08-20 DIAGNOSIS — N189 Chronic kidney disease, unspecified: Secondary | ICD-10-CM | POA: Diagnosis not present

## 2014-08-20 DIAGNOSIS — I10 Essential (primary) hypertension: Secondary | ICD-10-CM | POA: Diagnosis not present

## 2014-08-20 DIAGNOSIS — N183 Chronic kidney disease, stage 3 (moderate): Secondary | ICD-10-CM | POA: Diagnosis not present

## 2014-08-20 DIAGNOSIS — I701 Atherosclerosis of renal artery: Secondary | ICD-10-CM | POA: Diagnosis not present

## 2014-09-10 DIAGNOSIS — K5900 Constipation, unspecified: Secondary | ICD-10-CM | POA: Diagnosis not present

## 2014-09-10 DIAGNOSIS — I10 Essential (primary) hypertension: Secondary | ICD-10-CM | POA: Diagnosis not present

## 2014-09-10 DIAGNOSIS — M6281 Muscle weakness (generalized): Secondary | ICD-10-CM | POA: Diagnosis not present

## 2014-09-11 DIAGNOSIS — E119 Type 2 diabetes mellitus without complications: Secondary | ICD-10-CM | POA: Diagnosis not present

## 2014-09-11 DIAGNOSIS — E1140 Type 2 diabetes mellitus with diabetic neuropathy, unspecified: Secondary | ICD-10-CM | POA: Diagnosis not present

## 2014-09-11 DIAGNOSIS — Z9181 History of falling: Secondary | ICD-10-CM | POA: Diagnosis not present

## 2014-09-11 DIAGNOSIS — I10 Essential (primary) hypertension: Secondary | ICD-10-CM | POA: Diagnosis not present

## 2014-09-11 DIAGNOSIS — Z8701 Personal history of pneumonia (recurrent): Secondary | ICD-10-CM | POA: Diagnosis not present

## 2014-09-11 DIAGNOSIS — M6281 Muscle weakness (generalized): Secondary | ICD-10-CM | POA: Diagnosis not present

## 2014-09-11 DIAGNOSIS — E780 Pure hypercholesterolemia: Secondary | ICD-10-CM | POA: Diagnosis not present

## 2014-09-16 DIAGNOSIS — I10 Essential (primary) hypertension: Secondary | ICD-10-CM | POA: Diagnosis not present

## 2014-09-16 DIAGNOSIS — E119 Type 2 diabetes mellitus without complications: Secondary | ICD-10-CM | POA: Diagnosis not present

## 2014-09-16 DIAGNOSIS — E1140 Type 2 diabetes mellitus with diabetic neuropathy, unspecified: Secondary | ICD-10-CM | POA: Diagnosis not present

## 2014-09-16 DIAGNOSIS — M6281 Muscle weakness (generalized): Secondary | ICD-10-CM | POA: Diagnosis not present

## 2014-09-16 DIAGNOSIS — Z9181 History of falling: Secondary | ICD-10-CM | POA: Diagnosis not present

## 2014-09-16 DIAGNOSIS — E780 Pure hypercholesterolemia: Secondary | ICD-10-CM | POA: Diagnosis not present

## 2014-09-16 DIAGNOSIS — Z8701 Personal history of pneumonia (recurrent): Secondary | ICD-10-CM | POA: Diagnosis not present

## 2014-09-17 DIAGNOSIS — E780 Pure hypercholesterolemia: Secondary | ICD-10-CM | POA: Diagnosis not present

## 2014-09-17 DIAGNOSIS — Z9181 History of falling: Secondary | ICD-10-CM | POA: Diagnosis not present

## 2014-09-17 DIAGNOSIS — E1140 Type 2 diabetes mellitus with diabetic neuropathy, unspecified: Secondary | ICD-10-CM | POA: Diagnosis not present

## 2014-09-17 DIAGNOSIS — E119 Type 2 diabetes mellitus without complications: Secondary | ICD-10-CM | POA: Diagnosis not present

## 2014-09-17 DIAGNOSIS — I10 Essential (primary) hypertension: Secondary | ICD-10-CM | POA: Diagnosis not present

## 2014-09-17 DIAGNOSIS — M6281 Muscle weakness (generalized): Secondary | ICD-10-CM | POA: Diagnosis not present

## 2014-09-17 DIAGNOSIS — Z8701 Personal history of pneumonia (recurrent): Secondary | ICD-10-CM | POA: Diagnosis not present

## 2014-09-19 DIAGNOSIS — I10 Essential (primary) hypertension: Secondary | ICD-10-CM | POA: Diagnosis not present

## 2014-09-19 DIAGNOSIS — Z9181 History of falling: Secondary | ICD-10-CM | POA: Diagnosis not present

## 2014-09-19 DIAGNOSIS — Z8701 Personal history of pneumonia (recurrent): Secondary | ICD-10-CM | POA: Diagnosis not present

## 2014-09-19 DIAGNOSIS — E1140 Type 2 diabetes mellitus with diabetic neuropathy, unspecified: Secondary | ICD-10-CM | POA: Diagnosis not present

## 2014-09-19 DIAGNOSIS — E780 Pure hypercholesterolemia: Secondary | ICD-10-CM | POA: Diagnosis not present

## 2014-09-19 DIAGNOSIS — M6281 Muscle weakness (generalized): Secondary | ICD-10-CM | POA: Diagnosis not present

## 2014-09-19 DIAGNOSIS — E119 Type 2 diabetes mellitus without complications: Secondary | ICD-10-CM | POA: Diagnosis not present

## 2014-09-23 DIAGNOSIS — E780 Pure hypercholesterolemia: Secondary | ICD-10-CM | POA: Diagnosis not present

## 2014-09-23 DIAGNOSIS — Z9181 History of falling: Secondary | ICD-10-CM | POA: Diagnosis not present

## 2014-09-23 DIAGNOSIS — Z8701 Personal history of pneumonia (recurrent): Secondary | ICD-10-CM | POA: Diagnosis not present

## 2014-09-23 DIAGNOSIS — M6281 Muscle weakness (generalized): Secondary | ICD-10-CM | POA: Diagnosis not present

## 2014-09-23 DIAGNOSIS — E1140 Type 2 diabetes mellitus with diabetic neuropathy, unspecified: Secondary | ICD-10-CM | POA: Diagnosis not present

## 2014-09-23 DIAGNOSIS — I10 Essential (primary) hypertension: Secondary | ICD-10-CM | POA: Diagnosis not present

## 2014-09-23 DIAGNOSIS — E119 Type 2 diabetes mellitus without complications: Secondary | ICD-10-CM | POA: Diagnosis not present

## 2014-09-24 DIAGNOSIS — M6281 Muscle weakness (generalized): Secondary | ICD-10-CM | POA: Diagnosis not present

## 2014-09-24 DIAGNOSIS — E119 Type 2 diabetes mellitus without complications: Secondary | ICD-10-CM | POA: Diagnosis not present

## 2014-09-24 DIAGNOSIS — E780 Pure hypercholesterolemia: Secondary | ICD-10-CM | POA: Diagnosis not present

## 2014-09-24 DIAGNOSIS — Z9181 History of falling: Secondary | ICD-10-CM | POA: Diagnosis not present

## 2014-09-24 DIAGNOSIS — Z8701 Personal history of pneumonia (recurrent): Secondary | ICD-10-CM | POA: Diagnosis not present

## 2014-09-24 DIAGNOSIS — E1140 Type 2 diabetes mellitus with diabetic neuropathy, unspecified: Secondary | ICD-10-CM | POA: Diagnosis not present

## 2014-09-24 DIAGNOSIS — I10 Essential (primary) hypertension: Secondary | ICD-10-CM | POA: Diagnosis not present

## 2014-09-27 DIAGNOSIS — I10 Essential (primary) hypertension: Secondary | ICD-10-CM | POA: Diagnosis not present

## 2014-09-27 DIAGNOSIS — Z9181 History of falling: Secondary | ICD-10-CM | POA: Diagnosis not present

## 2014-09-27 DIAGNOSIS — Z8701 Personal history of pneumonia (recurrent): Secondary | ICD-10-CM | POA: Diagnosis not present

## 2014-09-27 DIAGNOSIS — E780 Pure hypercholesterolemia: Secondary | ICD-10-CM | POA: Diagnosis not present

## 2014-09-27 DIAGNOSIS — M6281 Muscle weakness (generalized): Secondary | ICD-10-CM | POA: Diagnosis not present

## 2014-09-27 DIAGNOSIS — E119 Type 2 diabetes mellitus without complications: Secondary | ICD-10-CM | POA: Diagnosis not present

## 2014-09-27 DIAGNOSIS — E1140 Type 2 diabetes mellitus with diabetic neuropathy, unspecified: Secondary | ICD-10-CM | POA: Diagnosis not present

## 2014-10-01 DIAGNOSIS — E780 Pure hypercholesterolemia: Secondary | ICD-10-CM | POA: Diagnosis not present

## 2014-10-01 DIAGNOSIS — Z8701 Personal history of pneumonia (recurrent): Secondary | ICD-10-CM | POA: Diagnosis not present

## 2014-10-01 DIAGNOSIS — E1140 Type 2 diabetes mellitus with diabetic neuropathy, unspecified: Secondary | ICD-10-CM | POA: Diagnosis not present

## 2014-10-01 DIAGNOSIS — M6281 Muscle weakness (generalized): Secondary | ICD-10-CM | POA: Diagnosis not present

## 2014-10-01 DIAGNOSIS — Z9181 History of falling: Secondary | ICD-10-CM | POA: Diagnosis not present

## 2014-10-01 DIAGNOSIS — E119 Type 2 diabetes mellitus without complications: Secondary | ICD-10-CM | POA: Diagnosis not present

## 2014-10-01 DIAGNOSIS — I10 Essential (primary) hypertension: Secondary | ICD-10-CM | POA: Diagnosis not present

## 2014-10-02 DIAGNOSIS — E1140 Type 2 diabetes mellitus with diabetic neuropathy, unspecified: Secondary | ICD-10-CM | POA: Diagnosis not present

## 2014-10-02 DIAGNOSIS — E780 Pure hypercholesterolemia: Secondary | ICD-10-CM | POA: Diagnosis not present

## 2014-10-02 DIAGNOSIS — Z8701 Personal history of pneumonia (recurrent): Secondary | ICD-10-CM | POA: Diagnosis not present

## 2014-10-02 DIAGNOSIS — M6281 Muscle weakness (generalized): Secondary | ICD-10-CM | POA: Diagnosis not present

## 2014-10-02 DIAGNOSIS — Z9181 History of falling: Secondary | ICD-10-CM | POA: Diagnosis not present

## 2014-10-02 DIAGNOSIS — I10 Essential (primary) hypertension: Secondary | ICD-10-CM | POA: Diagnosis not present

## 2014-10-02 DIAGNOSIS — E119 Type 2 diabetes mellitus without complications: Secondary | ICD-10-CM | POA: Diagnosis not present

## 2014-10-04 DIAGNOSIS — E780 Pure hypercholesterolemia: Secondary | ICD-10-CM | POA: Diagnosis not present

## 2014-10-04 DIAGNOSIS — E1140 Type 2 diabetes mellitus with diabetic neuropathy, unspecified: Secondary | ICD-10-CM | POA: Diagnosis not present

## 2014-10-04 DIAGNOSIS — M6281 Muscle weakness (generalized): Secondary | ICD-10-CM | POA: Diagnosis not present

## 2014-10-04 DIAGNOSIS — E119 Type 2 diabetes mellitus without complications: Secondary | ICD-10-CM | POA: Diagnosis not present

## 2014-10-04 DIAGNOSIS — Z8701 Personal history of pneumonia (recurrent): Secondary | ICD-10-CM | POA: Diagnosis not present

## 2014-10-04 DIAGNOSIS — Z9181 History of falling: Secondary | ICD-10-CM | POA: Diagnosis not present

## 2014-10-04 DIAGNOSIS — I10 Essential (primary) hypertension: Secondary | ICD-10-CM | POA: Diagnosis not present

## 2014-10-07 DIAGNOSIS — I10 Essential (primary) hypertension: Secondary | ICD-10-CM | POA: Diagnosis not present

## 2014-10-07 DIAGNOSIS — Z8701 Personal history of pneumonia (recurrent): Secondary | ICD-10-CM | POA: Diagnosis not present

## 2014-10-07 DIAGNOSIS — E780 Pure hypercholesterolemia: Secondary | ICD-10-CM | POA: Diagnosis not present

## 2014-10-07 DIAGNOSIS — M6281 Muscle weakness (generalized): Secondary | ICD-10-CM | POA: Diagnosis not present

## 2014-10-07 DIAGNOSIS — E119 Type 2 diabetes mellitus without complications: Secondary | ICD-10-CM | POA: Diagnosis not present

## 2014-10-07 DIAGNOSIS — Z9181 History of falling: Secondary | ICD-10-CM | POA: Diagnosis not present

## 2014-10-07 DIAGNOSIS — E1140 Type 2 diabetes mellitus with diabetic neuropathy, unspecified: Secondary | ICD-10-CM | POA: Diagnosis not present

## 2014-10-08 DIAGNOSIS — I10 Essential (primary) hypertension: Secondary | ICD-10-CM | POA: Diagnosis not present

## 2014-10-08 DIAGNOSIS — M6281 Muscle weakness (generalized): Secondary | ICD-10-CM | POA: Diagnosis not present

## 2014-10-08 DIAGNOSIS — Z9181 History of falling: Secondary | ICD-10-CM | POA: Diagnosis not present

## 2014-10-08 DIAGNOSIS — E119 Type 2 diabetes mellitus without complications: Secondary | ICD-10-CM | POA: Diagnosis not present

## 2014-10-08 DIAGNOSIS — Z8701 Personal history of pneumonia (recurrent): Secondary | ICD-10-CM | POA: Diagnosis not present

## 2014-10-08 DIAGNOSIS — E780 Pure hypercholesterolemia: Secondary | ICD-10-CM | POA: Diagnosis not present

## 2014-10-08 DIAGNOSIS — E1140 Type 2 diabetes mellitus with diabetic neuropathy, unspecified: Secondary | ICD-10-CM | POA: Diagnosis not present

## 2014-10-10 DIAGNOSIS — E119 Type 2 diabetes mellitus without complications: Secondary | ICD-10-CM | POA: Diagnosis not present

## 2014-10-10 DIAGNOSIS — E780 Pure hypercholesterolemia: Secondary | ICD-10-CM | POA: Diagnosis not present

## 2014-10-10 DIAGNOSIS — I10 Essential (primary) hypertension: Secondary | ICD-10-CM | POA: Diagnosis not present

## 2014-10-10 DIAGNOSIS — E1140 Type 2 diabetes mellitus with diabetic neuropathy, unspecified: Secondary | ICD-10-CM | POA: Diagnosis not present

## 2014-10-10 DIAGNOSIS — Z9181 History of falling: Secondary | ICD-10-CM | POA: Diagnosis not present

## 2014-10-10 DIAGNOSIS — M6281 Muscle weakness (generalized): Secondary | ICD-10-CM | POA: Diagnosis not present

## 2014-10-10 DIAGNOSIS — Z8701 Personal history of pneumonia (recurrent): Secondary | ICD-10-CM | POA: Diagnosis not present

## 2014-10-11 DIAGNOSIS — L821 Other seborrheic keratosis: Secondary | ICD-10-CM | POA: Diagnosis not present

## 2014-10-11 DIAGNOSIS — Z85828 Personal history of other malignant neoplasm of skin: Secondary | ICD-10-CM | POA: Diagnosis not present

## 2014-10-11 DIAGNOSIS — L570 Actinic keratosis: Secondary | ICD-10-CM | POA: Diagnosis not present

## 2014-10-11 DIAGNOSIS — L723 Sebaceous cyst: Secondary | ICD-10-CM | POA: Diagnosis not present

## 2014-10-14 ENCOUNTER — Other Ambulatory Visit: Payer: Self-pay | Admitting: Neurology

## 2014-10-14 MED ORDER — OXYCODONE-ACETAMINOPHEN 7.5-325 MG PO TABS: 1.0000 | ORAL_TABLET | Freq: Four times a day (QID) | ORAL | Status: DC | PRN

## 2014-10-14 NOTE — Telephone Encounter (Signed)
Request entered, forwarded to provider for approval.  

## 2014-10-14 NOTE — Telephone Encounter (Signed)
Patient requesting refill for Rx oxyCODONE-acetaminophen (PERCOCET) 7.5-325 MG per tablet.  Please call when ready for pick up. °

## 2014-10-15 DIAGNOSIS — Z9181 History of falling: Secondary | ICD-10-CM | POA: Diagnosis not present

## 2014-10-15 DIAGNOSIS — Z8701 Personal history of pneumonia (recurrent): Secondary | ICD-10-CM | POA: Diagnosis not present

## 2014-10-15 DIAGNOSIS — E1140 Type 2 diabetes mellitus with diabetic neuropathy, unspecified: Secondary | ICD-10-CM | POA: Diagnosis not present

## 2014-10-15 DIAGNOSIS — M6281 Muscle weakness (generalized): Secondary | ICD-10-CM | POA: Diagnosis not present

## 2014-10-15 DIAGNOSIS — I10 Essential (primary) hypertension: Secondary | ICD-10-CM | POA: Diagnosis not present

## 2014-10-15 DIAGNOSIS — E780 Pure hypercholesterolemia: Secondary | ICD-10-CM | POA: Diagnosis not present

## 2014-10-15 DIAGNOSIS — E119 Type 2 diabetes mellitus without complications: Secondary | ICD-10-CM | POA: Diagnosis not present

## 2014-10-18 DIAGNOSIS — E119 Type 2 diabetes mellitus without complications: Secondary | ICD-10-CM | POA: Diagnosis not present

## 2014-10-18 DIAGNOSIS — E1140 Type 2 diabetes mellitus with diabetic neuropathy, unspecified: Secondary | ICD-10-CM | POA: Diagnosis not present

## 2014-10-18 DIAGNOSIS — Z8701 Personal history of pneumonia (recurrent): Secondary | ICD-10-CM | POA: Diagnosis not present

## 2014-10-18 DIAGNOSIS — M6281 Muscle weakness (generalized): Secondary | ICD-10-CM | POA: Diagnosis not present

## 2014-10-18 DIAGNOSIS — E780 Pure hypercholesterolemia: Secondary | ICD-10-CM | POA: Diagnosis not present

## 2014-10-18 DIAGNOSIS — Z9181 History of falling: Secondary | ICD-10-CM | POA: Diagnosis not present

## 2014-10-18 DIAGNOSIS — I10 Essential (primary) hypertension: Secondary | ICD-10-CM | POA: Diagnosis not present

## 2014-10-21 DIAGNOSIS — Z9181 History of falling: Secondary | ICD-10-CM | POA: Diagnosis not present

## 2014-10-21 DIAGNOSIS — E119 Type 2 diabetes mellitus without complications: Secondary | ICD-10-CM | POA: Diagnosis not present

## 2014-10-21 DIAGNOSIS — E1140 Type 2 diabetes mellitus with diabetic neuropathy, unspecified: Secondary | ICD-10-CM | POA: Diagnosis not present

## 2014-10-21 DIAGNOSIS — M6281 Muscle weakness (generalized): Secondary | ICD-10-CM | POA: Diagnosis not present

## 2014-10-21 DIAGNOSIS — I10 Essential (primary) hypertension: Secondary | ICD-10-CM | POA: Diagnosis not present

## 2014-10-21 DIAGNOSIS — Z8701 Personal history of pneumonia (recurrent): Secondary | ICD-10-CM | POA: Diagnosis not present

## 2014-10-21 DIAGNOSIS — E780 Pure hypercholesterolemia: Secondary | ICD-10-CM | POA: Diagnosis not present

## 2014-10-24 DIAGNOSIS — E119 Type 2 diabetes mellitus without complications: Secondary | ICD-10-CM | POA: Diagnosis not present

## 2014-10-24 DIAGNOSIS — E780 Pure hypercholesterolemia: Secondary | ICD-10-CM | POA: Diagnosis not present

## 2014-10-24 DIAGNOSIS — Z8701 Personal history of pneumonia (recurrent): Secondary | ICD-10-CM | POA: Diagnosis not present

## 2014-10-24 DIAGNOSIS — E1140 Type 2 diabetes mellitus with diabetic neuropathy, unspecified: Secondary | ICD-10-CM | POA: Diagnosis not present

## 2014-10-24 DIAGNOSIS — I10 Essential (primary) hypertension: Secondary | ICD-10-CM | POA: Diagnosis not present

## 2014-10-24 DIAGNOSIS — M6281 Muscle weakness (generalized): Secondary | ICD-10-CM | POA: Diagnosis not present

## 2014-10-24 DIAGNOSIS — Z9181 History of falling: Secondary | ICD-10-CM | POA: Diagnosis not present

## 2014-10-28 DIAGNOSIS — E119 Type 2 diabetes mellitus without complications: Secondary | ICD-10-CM | POA: Diagnosis not present

## 2014-10-28 DIAGNOSIS — I10 Essential (primary) hypertension: Secondary | ICD-10-CM | POA: Diagnosis not present

## 2014-10-28 DIAGNOSIS — E780 Pure hypercholesterolemia: Secondary | ICD-10-CM | POA: Diagnosis not present

## 2014-10-29 DIAGNOSIS — I10 Essential (primary) hypertension: Secondary | ICD-10-CM | POA: Diagnosis not present

## 2014-10-29 DIAGNOSIS — Z9181 History of falling: Secondary | ICD-10-CM | POA: Diagnosis not present

## 2014-10-29 DIAGNOSIS — E1140 Type 2 diabetes mellitus with diabetic neuropathy, unspecified: Secondary | ICD-10-CM | POA: Diagnosis not present

## 2014-10-29 DIAGNOSIS — Z8701 Personal history of pneumonia (recurrent): Secondary | ICD-10-CM | POA: Diagnosis not present

## 2014-10-29 DIAGNOSIS — M6281 Muscle weakness (generalized): Secondary | ICD-10-CM | POA: Diagnosis not present

## 2014-10-29 DIAGNOSIS — E119 Type 2 diabetes mellitus without complications: Secondary | ICD-10-CM | POA: Diagnosis not present

## 2014-10-29 DIAGNOSIS — E780 Pure hypercholesterolemia: Secondary | ICD-10-CM | POA: Diagnosis not present

## 2014-11-01 DIAGNOSIS — M6281 Muscle weakness (generalized): Secondary | ICD-10-CM | POA: Diagnosis not present

## 2014-11-01 DIAGNOSIS — E1140 Type 2 diabetes mellitus with diabetic neuropathy, unspecified: Secondary | ICD-10-CM | POA: Diagnosis not present

## 2014-11-01 DIAGNOSIS — Z8701 Personal history of pneumonia (recurrent): Secondary | ICD-10-CM | POA: Diagnosis not present

## 2014-11-01 DIAGNOSIS — Z9181 History of falling: Secondary | ICD-10-CM | POA: Diagnosis not present

## 2014-11-01 DIAGNOSIS — I10 Essential (primary) hypertension: Secondary | ICD-10-CM | POA: Diagnosis not present

## 2014-11-01 DIAGNOSIS — E119 Type 2 diabetes mellitus without complications: Secondary | ICD-10-CM | POA: Diagnosis not present

## 2014-11-01 DIAGNOSIS — E780 Pure hypercholesterolemia: Secondary | ICD-10-CM | POA: Diagnosis not present

## 2014-11-05 DIAGNOSIS — I10 Essential (primary) hypertension: Secondary | ICD-10-CM | POA: Diagnosis not present

## 2014-11-05 DIAGNOSIS — E119 Type 2 diabetes mellitus without complications: Secondary | ICD-10-CM | POA: Diagnosis not present

## 2014-11-05 DIAGNOSIS — E780 Pure hypercholesterolemia: Secondary | ICD-10-CM | POA: Diagnosis not present

## 2014-11-05 DIAGNOSIS — Z8701 Personal history of pneumonia (recurrent): Secondary | ICD-10-CM | POA: Diagnosis not present

## 2014-11-05 DIAGNOSIS — M6281 Muscle weakness (generalized): Secondary | ICD-10-CM | POA: Diagnosis not present

## 2014-11-05 DIAGNOSIS — E1140 Type 2 diabetes mellitus with diabetic neuropathy, unspecified: Secondary | ICD-10-CM | POA: Diagnosis not present

## 2014-11-05 DIAGNOSIS — Z9181 History of falling: Secondary | ICD-10-CM | POA: Diagnosis not present

## 2014-11-06 DIAGNOSIS — I10 Essential (primary) hypertension: Secondary | ICD-10-CM | POA: Diagnosis not present

## 2014-11-06 DIAGNOSIS — Z9181 History of falling: Secondary | ICD-10-CM | POA: Diagnosis not present

## 2014-11-06 DIAGNOSIS — E1140 Type 2 diabetes mellitus with diabetic neuropathy, unspecified: Secondary | ICD-10-CM | POA: Diagnosis not present

## 2014-11-06 DIAGNOSIS — M6281 Muscle weakness (generalized): Secondary | ICD-10-CM | POA: Diagnosis not present

## 2014-11-06 DIAGNOSIS — Z8701 Personal history of pneumonia (recurrent): Secondary | ICD-10-CM | POA: Diagnosis not present

## 2014-11-06 DIAGNOSIS — E119 Type 2 diabetes mellitus without complications: Secondary | ICD-10-CM | POA: Diagnosis not present

## 2014-11-06 DIAGNOSIS — E780 Pure hypercholesterolemia: Secondary | ICD-10-CM | POA: Diagnosis not present

## 2014-11-11 DIAGNOSIS — E780 Pure hypercholesterolemia: Secondary | ICD-10-CM | POA: Diagnosis not present

## 2014-11-11 DIAGNOSIS — M6281 Muscle weakness (generalized): Secondary | ICD-10-CM | POA: Diagnosis not present

## 2014-11-11 DIAGNOSIS — I10 Essential (primary) hypertension: Secondary | ICD-10-CM | POA: Diagnosis not present

## 2014-11-11 DIAGNOSIS — Z8701 Personal history of pneumonia (recurrent): Secondary | ICD-10-CM | POA: Diagnosis not present

## 2014-11-11 DIAGNOSIS — E1140 Type 2 diabetes mellitus with diabetic neuropathy, unspecified: Secondary | ICD-10-CM | POA: Diagnosis not present

## 2014-11-11 DIAGNOSIS — Z9181 History of falling: Secondary | ICD-10-CM | POA: Diagnosis not present

## 2014-11-13 DIAGNOSIS — Z8701 Personal history of pneumonia (recurrent): Secondary | ICD-10-CM | POA: Diagnosis not present

## 2014-11-13 DIAGNOSIS — E780 Pure hypercholesterolemia: Secondary | ICD-10-CM | POA: Diagnosis not present

## 2014-11-13 DIAGNOSIS — E1140 Type 2 diabetes mellitus with diabetic neuropathy, unspecified: Secondary | ICD-10-CM | POA: Diagnosis not present

## 2014-11-13 DIAGNOSIS — I10 Essential (primary) hypertension: Secondary | ICD-10-CM | POA: Diagnosis not present

## 2014-11-13 DIAGNOSIS — Z9181 History of falling: Secondary | ICD-10-CM | POA: Diagnosis not present

## 2014-11-13 DIAGNOSIS — M6281 Muscle weakness (generalized): Secondary | ICD-10-CM | POA: Diagnosis not present

## 2014-11-20 DIAGNOSIS — E780 Pure hypercholesterolemia: Secondary | ICD-10-CM | POA: Diagnosis not present

## 2014-11-20 DIAGNOSIS — Z9181 History of falling: Secondary | ICD-10-CM | POA: Diagnosis not present

## 2014-11-20 DIAGNOSIS — I10 Essential (primary) hypertension: Secondary | ICD-10-CM | POA: Diagnosis not present

## 2014-11-20 DIAGNOSIS — E1140 Type 2 diabetes mellitus with diabetic neuropathy, unspecified: Secondary | ICD-10-CM | POA: Diagnosis not present

## 2014-11-20 DIAGNOSIS — M6281 Muscle weakness (generalized): Secondary | ICD-10-CM | POA: Diagnosis not present

## 2014-11-20 DIAGNOSIS — Z8701 Personal history of pneumonia (recurrent): Secondary | ICD-10-CM | POA: Diagnosis not present

## 2014-11-22 DIAGNOSIS — E780 Pure hypercholesterolemia: Secondary | ICD-10-CM | POA: Diagnosis not present

## 2014-11-22 DIAGNOSIS — E1140 Type 2 diabetes mellitus with diabetic neuropathy, unspecified: Secondary | ICD-10-CM | POA: Diagnosis not present

## 2014-11-22 DIAGNOSIS — M6281 Muscle weakness (generalized): Secondary | ICD-10-CM | POA: Diagnosis not present

## 2014-11-22 DIAGNOSIS — I10 Essential (primary) hypertension: Secondary | ICD-10-CM | POA: Diagnosis not present

## 2014-11-22 DIAGNOSIS — Z9181 History of falling: Secondary | ICD-10-CM | POA: Diagnosis not present

## 2014-11-22 DIAGNOSIS — Z8701 Personal history of pneumonia (recurrent): Secondary | ICD-10-CM | POA: Diagnosis not present

## 2014-11-25 DIAGNOSIS — Z9181 History of falling: Secondary | ICD-10-CM | POA: Diagnosis not present

## 2014-11-25 DIAGNOSIS — I10 Essential (primary) hypertension: Secondary | ICD-10-CM | POA: Diagnosis not present

## 2014-11-25 DIAGNOSIS — Z8701 Personal history of pneumonia (recurrent): Secondary | ICD-10-CM | POA: Diagnosis not present

## 2014-11-25 DIAGNOSIS — E780 Pure hypercholesterolemia: Secondary | ICD-10-CM | POA: Diagnosis not present

## 2014-11-25 DIAGNOSIS — M6281 Muscle weakness (generalized): Secondary | ICD-10-CM | POA: Diagnosis not present

## 2014-11-25 DIAGNOSIS — E1140 Type 2 diabetes mellitus with diabetic neuropathy, unspecified: Secondary | ICD-10-CM | POA: Diagnosis not present

## 2014-11-26 DIAGNOSIS — N183 Chronic kidney disease, stage 3 (moderate): Secondary | ICD-10-CM | POA: Diagnosis not present

## 2014-11-26 DIAGNOSIS — D631 Anemia in chronic kidney disease: Secondary | ICD-10-CM | POA: Diagnosis not present

## 2014-11-26 DIAGNOSIS — I10 Essential (primary) hypertension: Secondary | ICD-10-CM | POA: Diagnosis not present

## 2014-11-26 DIAGNOSIS — E119 Type 2 diabetes mellitus without complications: Secondary | ICD-10-CM | POA: Diagnosis not present

## 2014-11-28 DIAGNOSIS — E1140 Type 2 diabetes mellitus with diabetic neuropathy, unspecified: Secondary | ICD-10-CM | POA: Diagnosis not present

## 2014-11-28 DIAGNOSIS — Z8701 Personal history of pneumonia (recurrent): Secondary | ICD-10-CM | POA: Diagnosis not present

## 2014-11-28 DIAGNOSIS — E780 Pure hypercholesterolemia: Secondary | ICD-10-CM | POA: Diagnosis not present

## 2014-11-28 DIAGNOSIS — I10 Essential (primary) hypertension: Secondary | ICD-10-CM | POA: Diagnosis not present

## 2014-11-28 DIAGNOSIS — M6281 Muscle weakness (generalized): Secondary | ICD-10-CM | POA: Diagnosis not present

## 2014-11-28 DIAGNOSIS — Z9181 History of falling: Secondary | ICD-10-CM | POA: Diagnosis not present

## 2014-12-24 ENCOUNTER — Encounter: Payer: Self-pay | Admitting: Neurology

## 2014-12-24 ENCOUNTER — Ambulatory Visit (INDEPENDENT_AMBULATORY_CARE_PROVIDER_SITE_OTHER): Payer: Medicare Other | Admitting: Neurology

## 2014-12-24 VITALS — BP 133/59 | HR 58 | Ht 69.0 in

## 2014-12-24 DIAGNOSIS — R269 Unspecified abnormalities of gait and mobility: Secondary | ICD-10-CM

## 2014-12-24 DIAGNOSIS — M48061 Spinal stenosis, lumbar region without neurogenic claudication: Secondary | ICD-10-CM

## 2014-12-24 DIAGNOSIS — E1142 Type 2 diabetes mellitus with diabetic polyneuropathy: Secondary | ICD-10-CM | POA: Diagnosis not present

## 2014-12-24 DIAGNOSIS — R413 Other amnesia: Secondary | ICD-10-CM

## 2014-12-24 DIAGNOSIS — M4806 Spinal stenosis, lumbar region: Secondary | ICD-10-CM

## 2014-12-24 MED ORDER — OXYCODONE-ACETAMINOPHEN 7.5-325 MG PO TABS: 1.0000 | ORAL_TABLET | Freq: Four times a day (QID) | ORAL | Status: DC | PRN

## 2014-12-24 NOTE — Progress Notes (Signed)
Reason for visit: Peripheral neuropathy  [name] is an 81 y.o. male  History of present illness:  [name] is a 81 year old right-handed white male with a history of a diabetic peripheral neuropathy and spinal stenosis. The patient has significant discomfort in the thighs and knees when he stands up, the pain onset is almost immediately. He feels much more comfortable when he is sitting. The patient uses a wheelchair for mobility when he is outside the house, he will walk short distances with a walker inside the house, he has not had any recent falls. He just recently completed a course of physical therapy without much benefit. He is had some increased numbness and some weakness of the left hand. He continues to have some memory issues, with good days and bad days associated with this. He takes Percocet on occasion for discomfort. Overall, his ability to ambulate has declined. He has a ramp for going into the house. The patient lives in a double wide trailer. He returns for an evaluation. He indicates that he sleeps fairly well at night.  Past Medical History  Diagnosis Date  . Kidney disease   . Diabetes mellitus   . Hypertension   . MI (myocardial infarction)     1974  . Peripheral vascular disease     veinsi rerouted in both legs   . Pneumonia     hx of   . Anemia   . Blood transfusion     hx of 6 months ago   . GERD (gastroesophageal reflux disease)   . Arthritis   . Bowel obstruction   . PONV (postoperative nausea and vomiting)   . Gait disorder   . Depression   . Dyslipidemia   . Heart disease (organic)   . C. difficile colitis   . History of GI bleed     Upper  . Lumbosacral spinal stenosis   . Memory deficit 11/29/2012  . Degenerative arthritis   . Peptic ulcer disease   . Wears glasses   . HOH (hard of hearing)     Past Surgical History  Procedure Laterality Date  . Back surgery      back surgery x 2   . Eye surgery      bilateral cataract surgery  with implants   . Other surgical history      2 fatty tumors removed - on different arms   . Other surgical history      prostate surgery x 3   . Other surgical history      veins rerouted in both legs   . Other surgical history      hemorrhoid surgery  . Other surgical history      carpal tunnel right   . Other surgical history      big toe nails removed   . Other surgical history      repair of blood vessel in esophagus and stomach- 2006  . Other surgical history      bilateral kidney stents  2007  . Other surgical history      small bowel obstruction 2011   . Excision/release bursa hip  08/13/2011    Procedure: EXCISION/RELEASE BURSA HIP;  Surgeon: Joshua Bastos, [name];  Location: [facility];  Service: Orthopedics;  Laterality: Right;  Right Hip Excision Tumor  . Esophageal manometry  11/08/2011    Procedure: ESOPHAGEAL MANOMETRY (EM);  Surgeon: Joshua Beams, [name];  Location: [facility];  Service: Endoscopy;  Laterality: N/A;  .  Hemorrhoid surgery    . Cataract extraction Bilateral   . Lipoma resection    . Transurethral resection of prostate      For benign prostate hypertrophy  . Carpal tunnel release Bilateral   . Ureteral stent placement Bilateral   . Cervical laminectomy    . Skin cancer resection      Left forehead  . Right hip surgery    . Femoral-popliteal bypass graft Left 07-08-1997    Joshua Jews [name]  . Femoral-popliteal bypass graft Right 06-04-1997    Joshua Jews [name]  . Carpal tunnel release Left 11/20/2013    Procedure: LEFT  CARPAL TUNNEL RELEASE;  Surgeon: Joshua Sours, [name];  Location: [facility];  Service: Orthopedics;  Laterality: Left;  . Hypothenar fat pad transfer Left 11/20/2013    Procedure: HYPOTHENAR FAT PAD TRANSFER LEFT;  Surgeon: Joshua Sours, [name];  Location: [facility];  Service: Orthopedics;  Laterality: Left;    Family History  Problem Relation Age of Onset  . Cancer Sister     Social history:  reports that he quit  smoking about 42 years ago. He has never used smokeless tobacco. He reports that he does not drink alcohol or use illicit drugs.    Allergies  Allergen Reactions  . Hctz [Hydrochlorothiazide] Other (See Comments)    hyponatremia  . Tetracyclines & Related   . Amoxicillin Swelling  . Apresoline Esidrix [Hydralazine-Hctz] Itching  . Clarithromycin Itching  . Levaquin [Levofloxacin In D5w]   . Other     CT CONTRAST DYE  . Serzone [Nefazodone] Other (See Comments)    Felt bad    Medications:  Prior to Admission medications   Medication Sig Start Date End Date Taking? Authorizing Provider  aspirin EC 81 MG tablet Take 81 mg by mouth daily before breakfast.    Yes Historical Provider, [name]  carvedilol (COREG) 25 MG tablet Take 25 mg by mouth 2 (two) times daily with a meal.   Yes Historical Provider, [name]  doxazosin (CARDURA) 4 MG tablet Take 4 mg by mouth 2 (two) times daily.    Yes Historical Provider, [name]  ferrous sulfate 325 (65 FE) MG tablet Take 325 mg by mouth daily with breakfast.   Yes Historical Provider, [name]  finasteride (PROSCAR) 5 MG tablet Take 5 mg by mouth daily. 11/20/12  Yes Historical Provider, [name]  furosemide (LASIX) 40 MG tablet Take 40 mg by mouth 3 (three) times daily.    Yes Historical Provider, [name]  losartan (COZAAR) 100 MG tablet Take 50 mg by mouth daily.    Yes Historical Provider, [name]  mirtazapine (REMERON) 15 MG tablet Take 15 mg by mouth at bedtime.   Yes Historical Provider, [name]  Multiple Vitamin (MULITIVITAMIN WITH MINERALS) TABS Take 1 tablet by mouth daily before breakfast.    Yes Historical Provider, [name]  oxyCODONE-acetaminophen (PERCOCET) 7.5-325 MG per tablet Take 1 tablet by mouth every 6 (six) hours as needed. 10/14/14  Yes Joshua Ducking, [name]  polyethylene glycol [facility] / Floria Raveling) packet Take 17 g by mouth daily.   Yes Historical Provider, [name]  rosuvastatin (CRESTOR) 10 MG tablet Take 10 mg by mouth daily.   Yes Historical Provider, [name]  senna (SENOKOT) 8.6 MG  tablet Take 1 tablet by mouth 4 (four) times daily.   Yes Historical Provider, [name]  sertraline (ZOLOFT) 25 MG tablet Take 25 mg by mouth daily. 11/21/12  Yes Historical Provider, [name]  [place] Name: [name]  DB-foot cream   Yes Historical Provider, [name]  vitamin B-12 (CYANOCOBALAMIN) 1000 MCG tablet Take 1,000 mcg by mouth daily before breakfast.    Yes Historical Provider, [name]  vitamin C (ASCORBIC ACID) 500 MG tablet Take 500 mg by mouth daily before breakfast.    Yes Historical Provider, [name]    ROS:  Out of a complete 14 system review of symptoms, the patient complains only of the following symptoms, and all other reviewed systems are negative.  Difficulty swallowing Leg swelling Constipation Snoring Difficulty urinating Joint pain, walking difficulty Moles Bruising easily Numbness Anxiety, suicidal thoughts  Blood pressure 133/59, pulse 58, height 5\' 9"  (1.753 m).  Physical Exam  General: The patient is alert and cooperative at the time of the examination.  Skin: No significant peripheral edema is noted.   Neurologic Exam  Mental status: The patient is alert and oriented x 2 at the time of the examination (not oriented to date). The patient has apparent normal recent and remote memory, with an apparently normal attention span and concentration ability. Mini-Mental Status Examination done today shows a total score of 20/30. The patient is able to name 7 animals in 30 seconds.   Cranial nerves: Facial symmetry is present. Speech is normal, no aphasia or dysarthria is noted. Extraocular movements are full. Visual fields are full.  Motor: The patient has good strength in all 4 extremities, with exception that there is some weakness of intrinsic muscles of the hands bilaterally and bilateral foot drops.  Sensory examination: Soft touch sensation is symmetric on the face, arms, and legs. Numbness below the knees is noted.  Coordination: The patient has good finger-nose-finger  and heel-to-shin bilaterally.  Gait and station: The patient has the ability to stand with some assistance. Once up, the patient can stand on his own, he cannot effectively ambulate even with assistance.  Reflexes: Deep tendon reflexes are symmetric, but are depressed.   Assessment/Plan:  1. Diabetic peripheral neuropathy  2. Gait disorder  3. Memory disorder  The patient is having ongoing progression of his ability to ambulate. He uses a wheelchair when he is out of the house. I discussed the possibility of getting a motorized wheelchair for him, but I am not clear that he will have enough room inside of his house to use this. The patient will "think about it" and he will contact me if he desires to pursue a mobility assessment through physical therapy. The patient has not wished to go on medications for memory in the past. The patient will follow-up in 6 months. A prescription was given for Percocet.   Jill Alexanders [name] 12/24/2014 5:16 PM  [facility] [address] [place], [place] [address]  Phone [phone] Fax [phone]

## 2014-12-24 NOTE — Patient Instructions (Signed)

## 2014-12-25 ENCOUNTER — Ambulatory Visit: Payer: Medicare Other | Admitting: Neurology

## 2015-01-15 DIAGNOSIS — I10 Essential (primary) hypertension: Secondary | ICD-10-CM | POA: Diagnosis not present

## 2015-01-15 DIAGNOSIS — Z0000 Encounter for general adult medical examination without abnormal findings: Secondary | ICD-10-CM | POA: Diagnosis not present

## 2015-01-15 DIAGNOSIS — E1122 Type 2 diabetes mellitus with diabetic chronic kidney disease: Secondary | ICD-10-CM | POA: Diagnosis not present

## 2015-01-15 DIAGNOSIS — E780 Pure hypercholesterolemia: Secondary | ICD-10-CM | POA: Diagnosis not present

## 2015-02-05 DIAGNOSIS — Z23 Encounter for immunization: Secondary | ICD-10-CM | POA: Diagnosis not present

## 2015-02-05 DIAGNOSIS — I10 Essential (primary) hypertension: Secondary | ICD-10-CM | POA: Diagnosis not present

## 2015-02-05 DIAGNOSIS — Z0000 Encounter for general adult medical examination without abnormal findings: Secondary | ICD-10-CM | POA: Diagnosis not present

## 2015-02-25 DIAGNOSIS — N183 Chronic kidney disease, stage 3 (moderate): Secondary | ICD-10-CM | POA: Diagnosis not present

## 2015-02-25 DIAGNOSIS — I10 Essential (primary) hypertension: Secondary | ICD-10-CM | POA: Diagnosis not present

## 2015-02-25 DIAGNOSIS — E119 Type 2 diabetes mellitus without complications: Secondary | ICD-10-CM | POA: Diagnosis not present

## 2015-02-25 DIAGNOSIS — D631 Anemia in chronic kidney disease: Secondary | ICD-10-CM | POA: Diagnosis not present

## 2015-03-19 ENCOUNTER — Telehealth: Payer: Self-pay | Admitting: Neurology

## 2015-03-19 ENCOUNTER — Other Ambulatory Visit: Payer: Self-pay | Admitting: Neurology

## 2015-03-19 DIAGNOSIS — N312 Flaccid neuropathic bladder, not elsewhere classified: Secondary | ICD-10-CM | POA: Diagnosis not present

## 2015-03-19 DIAGNOSIS — R351 Nocturia: Secondary | ICD-10-CM | POA: Diagnosis not present

## 2015-03-19 DIAGNOSIS — N401 Enlarged prostate with lower urinary tract symptoms: Secondary | ICD-10-CM | POA: Diagnosis not present

## 2015-03-19 MED ORDER — OXYCODONE-ACETAMINOPHEN 7.5-325 MG PO TABS: 1.0000 | ORAL_TABLET | Freq: Four times a day (QID) | ORAL | Status: DC | PRN

## 2015-03-19 NOTE — Telephone Encounter (Signed)
Patient's wife is calling to get a written Rx for oxyCODONE-acetaminophen (PERCOCET) 7.5-325 MG per tablet for the patient. I advised the Rx will be ready in 24 hours unless otherwise advised by the nurse. Thank you.

## 2015-03-19 NOTE — Telephone Encounter (Signed)
A prescription was written for the oxycodone.

## 2015-03-20 ENCOUNTER — Telehealth: Payer: Self-pay

## 2015-03-20 NOTE — Telephone Encounter (Signed)
Rx ready for pick up. 

## 2015-04-18 DIAGNOSIS — L570 Actinic keratosis: Secondary | ICD-10-CM | POA: Diagnosis not present

## 2015-04-18 DIAGNOSIS — D044 Carcinoma in situ of skin of scalp and neck: Secondary | ICD-10-CM | POA: Diagnosis not present

## 2015-04-18 DIAGNOSIS — L820 Inflamed seborrheic keratosis: Secondary | ICD-10-CM | POA: Diagnosis not present

## 2015-05-06 DIAGNOSIS — E119 Type 2 diabetes mellitus without complications: Secondary | ICD-10-CM | POA: Diagnosis not present

## 2015-05-06 DIAGNOSIS — E7800 Pure hypercholesterolemia, unspecified: Secondary | ICD-10-CM | POA: Diagnosis not present

## 2015-05-13 DIAGNOSIS — K5900 Constipation, unspecified: Secondary | ICD-10-CM | POA: Diagnosis not present

## 2015-05-13 DIAGNOSIS — E785 Hyperlipidemia, unspecified: Secondary | ICD-10-CM | POA: Diagnosis not present

## 2015-05-13 DIAGNOSIS — I10 Essential (primary) hypertension: Secondary | ICD-10-CM | POA: Diagnosis not present

## 2015-05-13 DIAGNOSIS — E119 Type 2 diabetes mellitus without complications: Secondary | ICD-10-CM | POA: Diagnosis not present

## 2015-06-19 DIAGNOSIS — D631 Anemia in chronic kidney disease: Secondary | ICD-10-CM | POA: Diagnosis not present

## 2015-06-19 DIAGNOSIS — E119 Type 2 diabetes mellitus without complications: Secondary | ICD-10-CM | POA: Diagnosis not present

## 2015-06-19 DIAGNOSIS — N183 Chronic kidney disease, stage 3 (moderate): Secondary | ICD-10-CM | POA: Diagnosis not present

## 2015-06-19 DIAGNOSIS — I701 Atherosclerosis of renal artery: Secondary | ICD-10-CM | POA: Diagnosis not present

## 2015-06-19 DIAGNOSIS — I10 Essential (primary) hypertension: Secondary | ICD-10-CM | POA: Diagnosis not present

## 2015-06-27 ENCOUNTER — Encounter: Payer: Self-pay | Admitting: Neurology

## 2015-06-27 ENCOUNTER — Ambulatory Visit (INDEPENDENT_AMBULATORY_CARE_PROVIDER_SITE_OTHER): Payer: Medicare Other | Admitting: Neurology

## 2015-06-27 VITALS — BP 138/55 | HR 60 | Ht 69.5 in

## 2015-06-27 DIAGNOSIS — M48061 Spinal stenosis, lumbar region without neurogenic claudication: Secondary | ICD-10-CM

## 2015-06-27 DIAGNOSIS — M4806 Spinal stenosis, lumbar region: Secondary | ICD-10-CM | POA: Diagnosis not present

## 2015-06-27 DIAGNOSIS — R269 Unspecified abnormalities of gait and mobility: Secondary | ICD-10-CM | POA: Diagnosis not present

## 2015-06-27 DIAGNOSIS — R413 Other amnesia: Secondary | ICD-10-CM | POA: Diagnosis not present

## 2015-06-27 DIAGNOSIS — E1142 Type 2 diabetes mellitus with diabetic polyneuropathy: Secondary | ICD-10-CM

## 2015-06-27 MED ORDER — OXYCODONE-ACETAMINOPHEN 7.5-325 MG PO TABS: 1.0000 | ORAL_TABLET | Freq: Four times a day (QID) | ORAL | Status: DC | PRN

## 2015-06-27 MED ORDER — LIDOCAINE 5 % EX OINT: 1.0000 "application " | TOPICAL_OINTMENT | CUTANEOUS | Status: DC | PRN

## 2015-06-27 NOTE — Patient Instructions (Signed)
Fall Prevention in the Home  Falls can cause injuries and can affect people from all age groups. There are many simple things that you can do to make your home safe and to help prevent falls. WHAT CAN I DO ON THE OUTSIDE OF MY HOME?  Regularly repair the edges of walkways and driveways and fix any cracks.  Remove high doorway thresholds.  Trim any shrubbery on the main path into your home.  Use bright outdoor lighting.  Clear walkways of debris and clutter, including tools and rocks.  Regularly check that handrails are securely fastened and in good repair. Both sides of any steps should have handrails.  Install guardrails along the edges of any raised decks or porches.  Have leaves, snow, and ice cleared regularly.  Use sand or salt on walkways during winter months.  In the garage, clean up any spills right away, including grease or oil spills. WHAT CAN I DO IN THE BATHROOM?  Use night lights.  Install grab bars by the toilet and in the tub and shower. Do not use towel bars as grab bars.  Use non-skid mats or decals on the floor of the tub or shower.  If you need to sit down while you are in the shower, use a plastic, non-slip stool..  Keep the floor dry. Immediately clean up any water that spills on the floor.  Remove soap buildup in the tub or shower on a regular basis.  Attach bath mats securely with double-sided non-slip rug tape.  Remove throw rugs and other tripping hazards from the floor. WHAT CAN I DO IN THE BEDROOM?  Use night lights.  Make sure that a bedside light is easy to reach.  Do not use oversized bedding that drapes onto the floor.  Have a firm chair that has side arms to use for getting dressed.  Remove throw rugs and other tripping hazards from the floor. WHAT CAN I DO IN THE KITCHEN?   Clean up any spills right away.  Avoid walking on wet floors.  Place frequently used items in easy-to-reach places.  If you need to reach for something  above you, use a sturdy step stool that has a grab bar.  Keep electrical cables out of the way.  Do not use floor polish or wax that makes floors slippery. If you have to use wax, make sure that it is non-skid floor wax.  Remove throw rugs and other tripping hazards from the floor. WHAT CAN I DO IN THE STAIRWAYS?  Do not leave any items on the stairs.  Make sure that there are handrails on both sides of the stairs. Fix handrails that are broken or loose. Make sure that handrails are as long as the stairways.  Check any carpeting to make sure that it is firmly attached to the stairs. Fix any carpet that is loose or worn.  Avoid having throw rugs at the top or bottom of stairways, or secure the rugs with carpet tape to prevent them from moving.  Make sure that you have a light switch at the top of the stairs and the bottom of the stairs. If you do not have them, have them installed. WHAT ARE SOME OTHER FALL PREVENTION TIPS?  Wear closed-toe shoes that fit well and support your feet. Wear shoes that have rubber soles or low heels.  When you use a stepladder, make sure that it is completely opened and that the sides are firmly locked. Have someone hold the ladder while you   are using it. Do not climb a closed stepladder.  Add color or contrast paint or tape to grab bars and handrails in your home. Place contrasting color strips on the first and last steps.  Use mobility aids as needed, such as canes, walkers, scooters, and crutches.  Turn on lights if it is dark. Replace any light bulbs that burn out.  Set up furniture so that there are clear paths. Keep the furniture in the same spot.  Fix any uneven floor surfaces.  Choose a carpet design that does not hide the edge of steps of a stairway.  Be aware of any and all pets.  Review your medicines with your healthcare provider. Some medicines can cause dizziness or changes in blood pressure, which increase your risk of falling. Talk  with your health care provider about other ways that you can decrease your risk of falls. This may include working with a physical therapist or trainer to improve your strength, balance, and endurance.   This information is not intended to replace advice given to you by your health care provider. Make sure you discuss any questions you have with your health care provider.   Document Released: 05/28/2002 Document Revised: 10/22/2014 Document Reviewed: 07/12/2014 Elsevier Interactive Patient Education 2016 [employer]  

## 2015-06-27 NOTE — Progress Notes (Signed)
Reason for visit: Peripheral neuropathy  [name] is an 82 y.o. male  History of present illness:  [name] is a 82 year old right-handed white male with a history of diabetes with a severe diabetic peripheral neuropathy. The patient also has significant degenerative arthritis affecting the hips and knees. The patient has a lot of pain with weightbearing. He also has significant balance issues, he may use a walker for short distance ambulation inside the house, but he generally will use a wheelchair outside the house. The patient has fallen on occasion, his wife cannot get him up when he falls, they have to call EMS. The patient has been using a topical over-the-counter cream for his neuropathy discomfort at night which has been effective over the last 2 years, but it is losing its effectiveness. The patient has some burning and itching of the feet at night that keeps him awake frequently. He does have some mild memory issues that are persistent, they have not progressed much, but the wife indicates that he may have good days and bad days with the memory. He has had significant constipation issues, initially thought secondary to the Percocet, but the patient continues to have severe constipation off the medication as well. The patient reports some issues with numbness of the left hand that has developed recently, he will drop things from the hand, and hand is uncomfortable for him. He returns for further evaluation.  Past Medical History  Diagnosis Date  . Kidney disease   . Diabetes mellitus   . Hypertension   . MI (myocardial infarction) ([place])     1974  . Peripheral vascular disease ([place])     veinsi rerouted in both legs   . Pneumonia     hx of   . Anemia   . Blood transfusion     hx of 6 months ago   . GERD (gastroesophageal reflux disease)   . Arthritis   . Bowel obstruction ([place])   . PONV (postoperative nausea and vomiting)   . Gait disorder   . Depression   .  Dyslipidemia   . Heart disease (organic)   . C. difficile colitis   . History of GI bleed     Upper  . Lumbosacral spinal stenosis   . Memory deficit 11/29/2012  . Degenerative arthritis   . Peptic ulcer disease   . Wears glasses   . HOH (hard of hearing)     Past Surgical History  Procedure Laterality Date  . Back surgery      back surgery x 2   . Eye surgery      bilateral cataract surgery with implants   . Other surgical history      2 fatty tumors removed - on different arms   . Other surgical history      prostate surgery x 3   . Other surgical history      veins rerouted in both legs   . Other surgical history      hemorrhoid surgery  . Other surgical history      carpal tunnel right   . Other surgical history      big toe nails removed   . Other surgical history      repair of blood vessel in esophagus and stomach- 2006  . Other surgical history      bilateral kidney stents  2007  . Other surgical history      small bowel obstruction 2011   . Excision/release bursa  hip  08/13/2011    Procedure: EXCISION/RELEASE BURSA HIP;  Surgeon: Tobi Bastos, MD;  [facility];  Service: Orthopedics;  Laterality: Right;  Right Hip Excision Tumor  . Esophageal manometry  11/08/2011    Procedure: ESOPHAGEAL MANOMETRY (EM);  Surgeon: Beryle Beams, MD;  [facility];  Service: Endoscopy;  Laterality: N/A;  . Hemorrhoid surgery    . Cataract extraction Bilateral   . Lipoma resection    . Transurethral resection of prostate      For benign prostate hypertrophy  . Carpal tunnel release Bilateral   . Ureteral stent placement Bilateral   . Cervical laminectomy    . Skin cancer resection      Left forehead  . Right hip surgery    . Femoral-popliteal bypass graft Left 07-08-1997    Curt Jews MD  . Femoral-popliteal bypass graft Right 06-04-1997    Curt Jews MD  . Carpal tunnel release Left 11/20/2013    Procedure: LEFT  CARPAL TUNNEL RELEASE;  Surgeon: Wynonia Sours, MD;  Location: [facility];  Service: Orthopedics;  Laterality: Left;  . Hypothenar fat pad transfer Left 11/20/2013    Procedure: HYPOTHENAR FAT PAD TRANSFER LEFT;  Surgeon: Wynonia Sours, MD;  Location: [facility];  Service: Orthopedics;  Laterality: Left;    Family History  Problem Relation Age of Onset  . Cancer Sister     Social history:  reports that he quit smoking about 42 years ago. He has never used smokeless tobacco. He reports that he does not drink alcohol or use illicit drugs.    Allergies  Allergen Reactions  . Hctz [Hydrochlorothiazide] Other (See Comments)    hyponatremia  . Tetracyclines & Related   . Amoxicillin Swelling  . Apresoline Esidrix [Hydralazine-Hctz] Itching  . Biaxin [Clarithromycin]     rash  . Clarithromycin Itching  . Hydralazine   . Levaquin [Levofloxacin In D5w]   . Other     CT CONTRAST DYE  . Serzone [Nefazodone] Other (See Comments)    Felt bad    Medications:  Prior to Admission medications   Medication Sig Start Date End Date Taking? Authorizing Provider  aspirin EC 81 MG tablet Take 81 mg by mouth daily before breakfast.     Historical Provider, MD  carvedilol (COREG) 25 MG tablet Take 25 mg by mouth 2 (two) times daily with a meal.    Historical Provider, MD  doxazosin (CARDURA) 4 MG tablet Take 4 mg by mouth 2 (two) times daily.     Historical Provider, MD  ferrous sulfate 325 (65 FE) MG tablet Take 325 mg by mouth daily with breakfast.    Historical Provider, MD  finasteride (PROSCAR) 5 MG tablet Take 5 mg by mouth daily. 11/20/12   Historical Provider, MD  furosemide (LASIX) 40 MG tablet Take 40 mg by mouth 3 (three) times daily.     Historical Provider, MD  losartan (COZAAR) 100 MG tablet Take 50 mg by mouth daily.     Historical Provider, MD  mirtazapine (REMERON) 15 MG tablet Take 15 mg by mouth at bedtime.    Historical Provider, MD  Multiple Vitamin (MULITIVITAMIN WITH MINERALS) TABS Take 1  tablet by mouth daily before breakfast.     Historical Provider, MD  oxyCODONE-acetaminophen (PERCOCET) 7.5-325 MG tablet Take 1 tablet by mouth every 6 (six) hours as needed. 03/19/15   Kathrynn Ducking, MD  polyethylene glycol [facility] / Floria Raveling) packet Take 17  g by mouth daily.    Historical Provider, MD  rosuvastatin (CRESTOR) 10 MG tablet Take 10 mg by mouth daily.    Historical Provider, MD  senna (SENOKOT) 8.6 MG tablet Take 1 tablet by mouth 4 (four) times daily.    Historical Provider, MD  sertraline (ZOLOFT) 25 MG tablet Take 25 mg by mouth daily. 11/21/12   Historical Provider, MD  UNABLE TO FIND Med Name: Magnlife DB-foot cream    Historical Provider, MD  vitamin B-12 (CYANOCOBALAMIN) 1000 MCG tablet Take 1,000 mcg by mouth daily before breakfast.     Historical Provider, MD  vitamin C (ASCORBIC ACID) 500 MG tablet Take 500 mg by mouth daily before breakfast.     Historical Provider, MD    ROS:  Out of a complete 14 system review of symptoms, the patient complains only of the following symptoms, and all other reviewed systems are negative.  Leg swelling Constipation Difficulty urinating Walking difficulty  Blood pressure 138/55, pulse 60, height 5' 9.5" (1.765 m).  Physical Exam  General: The patient is alert and cooperative at the time of the examination.  Skin: No significant peripheral edema is noted.   Neurologic Exam  Mental status: The patient is alert and oriented x 2 at the time of the examination (not oriented to date). The Mini-Mental Status Examination done today shows a total score of 15/30. The patient is able to name 3 animals in 30 seconds.   Cranial nerves: Facial symmetry is present. Speech is normal, no aphasia or dysarthria is noted. Extraocular movements are full. Visual fields are full.  Motor: The patient has good strength in all 4 extremities, with exception that there are bilateral foot drops.  Sensory examination: Soft touch sensation is  symmetric on the face, arms, and legs. The patient has a stocking pattern pinprick sensory deficit up to the knees bilaterally.  Coordination: The patient has good finger-nose-finger and heel-to-shin bilaterally.  Gait and station: The patient requires assistance with standing. Once up, he cannot effectively ambulate even with assistance.  Reflexes: Deep tendon reflexes are symmetric, but are depressed.   Assessment/Plan:  1. Diabetic peripheral neuropathy  2. Gait disturbance  3. Memory disturbance  4. Chronic constipation  5. Degenerative arthritis  The patient has had ongoing issues with significant discomfort of the feet, he now has developed some numbness and discomfort of the left hand. We will place him in a wrist splint on the left hand to help the numbness. The patient will be given a prescription for lidocaine ointment to use at night to see if this help some of his discomfort. If this is not effective, they are to contact me, and I will add low-dose gabapentin at night. The patient does not wish to start a medication such as Amitiza for constipation. He was given another prescription for Percocet. He will follow-up in 6 months, sooner if needed. His mobility is getting quite limited at this time. The Mini-Mental Status Examination has shown a significant decline from last evaluation where he scored 20/30 in July 2016.  Jill Alexanders MD 06/27/2015 3:55 PM  [facility] [address] [place], [place] [address]  Phone [phone] Fax [phone]

## 2015-07-07 ENCOUNTER — Emergency Department (HOSPITAL_COMMUNITY): Payer: Medicare Other

## 2015-07-07 ENCOUNTER — Other Ambulatory Visit (HOSPITAL_COMMUNITY): Payer: Self-pay

## 2015-07-07 ENCOUNTER — Encounter (HOSPITAL_COMMUNITY): Payer: Self-pay | Admitting: Emergency Medicine

## 2015-07-07 ENCOUNTER — Inpatient Hospital Stay (HOSPITAL_COMMUNITY)
Admission: EM | Admit: 2015-07-07 | Discharge: 2015-07-11 | DRG: 470 | Disposition: A | Discharge status: Skilled Nursing Facility | Payer: Medicare Other | Attending: Internal Medicine | Admitting: Internal Medicine

## 2015-07-07 DIAGNOSIS — H9190 Unspecified hearing loss, unspecified ear: Secondary | ICD-10-CM | POA: Diagnosis not present

## 2015-07-07 DIAGNOSIS — E119 Type 2 diabetes mellitus without complications: Secondary | ICD-10-CM

## 2015-07-07 DIAGNOSIS — S72009A Fracture of unspecified part of neck of unspecified femur, initial encounter for closed fracture: Secondary | ICD-10-CM | POA: Diagnosis present

## 2015-07-07 DIAGNOSIS — S72012A Unspecified intracapsular fracture of left femur, initial encounter for closed fracture: Principal | ICD-10-CM | POA: Diagnosis present

## 2015-07-07 DIAGNOSIS — M25552 Pain in left hip: Secondary | ICD-10-CM | POA: Diagnosis not present

## 2015-07-07 DIAGNOSIS — I739 Peripheral vascular disease, unspecified: Secondary | ICD-10-CM | POA: Diagnosis present

## 2015-07-07 DIAGNOSIS — I252 Old myocardial infarction: Secondary | ICD-10-CM | POA: Diagnosis not present

## 2015-07-07 DIAGNOSIS — N139 Obstructive and reflux uropathy, unspecified: Secondary | ICD-10-CM | POA: Diagnosis not present

## 2015-07-07 DIAGNOSIS — N183 Chronic kidney disease, stage 3 unspecified: Secondary | ICD-10-CM | POA: Diagnosis present

## 2015-07-07 DIAGNOSIS — Z01818 Encounter for other preprocedural examination: Secondary | ICD-10-CM | POA: Diagnosis not present

## 2015-07-07 DIAGNOSIS — Z9841 Cataract extraction status, right eye: Secondary | ICD-10-CM

## 2015-07-07 DIAGNOSIS — N401 Enlarged prostate with lower urinary tract symptoms: Secondary | ICD-10-CM | POA: Diagnosis not present

## 2015-07-07 DIAGNOSIS — E0842 Diabetes mellitus due to underlying condition with diabetic polyneuropathy: Secondary | ICD-10-CM | POA: Diagnosis not present

## 2015-07-07 DIAGNOSIS — N138 Other obstructive and reflux uropathy: Secondary | ICD-10-CM | POA: Diagnosis present

## 2015-07-07 DIAGNOSIS — D638 Anemia in other chronic diseases classified elsewhere: Secondary | ICD-10-CM | POA: Diagnosis present

## 2015-07-07 DIAGNOSIS — F329 Major depressive disorder, single episode, unspecified: Secondary | ICD-10-CM | POA: Diagnosis present

## 2015-07-07 DIAGNOSIS — I129 Hypertensive chronic kidney disease with stage 1 through stage 4 chronic kidney disease, or unspecified chronic kidney disease: Secondary | ICD-10-CM | POA: Diagnosis not present

## 2015-07-07 DIAGNOSIS — N184 Chronic kidney disease, stage 4 (severe): Secondary | ICD-10-CM | POA: Diagnosis present

## 2015-07-07 DIAGNOSIS — E1142 Type 2 diabetes mellitus with diabetic polyneuropathy: Secondary | ICD-10-CM | POA: Diagnosis not present

## 2015-07-07 DIAGNOSIS — I10 Essential (primary) hypertension: Secondary | ICD-10-CM | POA: Diagnosis not present

## 2015-07-07 DIAGNOSIS — K219 Gastro-esophageal reflux disease without esophagitis: Secondary | ICD-10-CM | POA: Diagnosis not present

## 2015-07-07 DIAGNOSIS — E785 Hyperlipidemia, unspecified: Secondary | ICD-10-CM | POA: Diagnosis not present

## 2015-07-07 DIAGNOSIS — M25531 Pain in right wrist: Secondary | ICD-10-CM | POA: Diagnosis not present

## 2015-07-07 DIAGNOSIS — I5022 Chronic systolic (congestive) heart failure: Secondary | ICD-10-CM | POA: Diagnosis not present

## 2015-07-07 DIAGNOSIS — Z7982 Long term (current) use of aspirin: Secondary | ICD-10-CM

## 2015-07-07 DIAGNOSIS — I2510 Atherosclerotic heart disease of native coronary artery without angina pectoris: Secondary | ICD-10-CM | POA: Diagnosis present

## 2015-07-07 DIAGNOSIS — M7071 Other bursitis of hip, right hip: Secondary | ICD-10-CM | POA: Diagnosis present

## 2015-07-07 DIAGNOSIS — T148 Other injury of unspecified body region: Secondary | ICD-10-CM | POA: Diagnosis not present

## 2015-07-07 DIAGNOSIS — E1151 Type 2 diabetes mellitus with diabetic peripheral angiopathy without gangrene: Secondary | ICD-10-CM | POA: Diagnosis not present

## 2015-07-07 DIAGNOSIS — E860 Dehydration: Secondary | ICD-10-CM | POA: Diagnosis present

## 2015-07-07 DIAGNOSIS — D696 Thrombocytopenia, unspecified: Secondary | ICD-10-CM | POA: Diagnosis present

## 2015-07-07 DIAGNOSIS — D649 Anemia, unspecified: Secondary | ICD-10-CM | POA: Diagnosis not present

## 2015-07-07 DIAGNOSIS — W06XXXA Fall from bed, initial encounter: Secondary | ICD-10-CM | POA: Diagnosis present

## 2015-07-07 DIAGNOSIS — E1122 Type 2 diabetes mellitus with diabetic chronic kidney disease: Secondary | ICD-10-CM | POA: Diagnosis not present

## 2015-07-07 DIAGNOSIS — R338 Other retention of urine: Secondary | ICD-10-CM | POA: Diagnosis not present

## 2015-07-07 DIAGNOSIS — M1990 Unspecified osteoarthritis, unspecified site: Secondary | ICD-10-CM | POA: Diagnosis not present

## 2015-07-07 DIAGNOSIS — Z9842 Cataract extraction status, left eye: Secondary | ICD-10-CM | POA: Diagnosis not present

## 2015-07-07 DIAGNOSIS — Z87891 Personal history of nicotine dependence: Secondary | ICD-10-CM

## 2015-07-07 DIAGNOSIS — S72002A Fracture of unspecified part of neck of left femur, initial encounter for closed fracture: Secondary | ICD-10-CM

## 2015-07-07 DIAGNOSIS — D500 Iron deficiency anemia secondary to blood loss (chronic): Secondary | ICD-10-CM | POA: Diagnosis not present

## 2015-07-07 DIAGNOSIS — R918 Other nonspecific abnormal finding of lung field: Secondary | ICD-10-CM | POA: Diagnosis not present

## 2015-07-07 DIAGNOSIS — Z96642 Presence of left artificial hip joint: Secondary | ICD-10-CM | POA: Diagnosis not present

## 2015-07-07 DIAGNOSIS — G629 Polyneuropathy, unspecified: Secondary | ICD-10-CM | POA: Diagnosis present

## 2015-07-07 DIAGNOSIS — I5032 Chronic diastolic (congestive) heart failure: Secondary | ICD-10-CM | POA: Diagnosis present

## 2015-07-07 DIAGNOSIS — L8990 Pressure ulcer of unspecified site, unspecified stage: Secondary | ICD-10-CM

## 2015-07-07 DIAGNOSIS — Z471 Aftercare following joint replacement surgery: Secondary | ICD-10-CM | POA: Diagnosis not present

## 2015-07-07 DIAGNOSIS — M7989 Other specified soft tissue disorders: Secondary | ICD-10-CM | POA: Diagnosis not present

## 2015-07-07 DIAGNOSIS — R296 Repeated falls: Secondary | ICD-10-CM | POA: Diagnosis not present

## 2015-07-07 DIAGNOSIS — M6281 Muscle weakness (generalized): Secondary | ICD-10-CM | POA: Diagnosis not present

## 2015-07-07 DIAGNOSIS — S72002S Fracture of unspecified part of neck of left femur, sequela: Secondary | ICD-10-CM | POA: Diagnosis not present

## 2015-07-07 DIAGNOSIS — I119 Hypertensive heart disease without heart failure: Secondary | ICD-10-CM | POA: Diagnosis not present

## 2015-07-07 DIAGNOSIS — L89310 Pressure ulcer of right buttock, unstageable: Secondary | ICD-10-CM | POA: Diagnosis not present

## 2015-07-07 DIAGNOSIS — S728X2A Other fracture of left femur, initial encounter for closed fracture: Secondary | ICD-10-CM | POA: Diagnosis not present

## 2015-07-07 HISTORY — DX: Chronic kidney disease, stage 4 (severe): N18.4

## 2015-07-07 HISTORY — DX: Type 2 diabetes mellitus with diabetic polyneuropathy: E11.42

## 2015-07-07 LAB — CBC
HCT: 37.1 % — ABNORMAL LOW (ref 39.0–52.0)
Hemoglobin: 11.8 g/dL — ABNORMAL LOW (ref 13.0–17.0)
MCH: 29.3 pg (ref 26.0–34.0)
MCHC: 31.8 g/dL (ref 30.0–36.0)
MCV: 92.1 fL (ref 78.0–100.0)
PLATELETS: 151 10*3/uL (ref 150–400)
RBC: 4.03 MIL/uL — ABNORMAL LOW (ref 4.22–5.81)
RDW: 14.4 % (ref 11.5–15.5)
WBC: 14.3 10*3/uL — ABNORMAL HIGH (ref 4.0–10.5)

## 2015-07-07 LAB — BASIC METABOLIC PANEL
Anion gap: 14 (ref 5–15)
BUN: 50 mg/dL — AB (ref 6–20)
CHLORIDE: 99 mmol/L — AB (ref 101–111)
CO2: 27 mmol/L (ref 22–32)
CREATININE: 2.14 mg/dL — AB (ref 0.61–1.24)
Calcium: 8.8 mg/dL — ABNORMAL LOW (ref 8.9–10.3)
GFR calc non Af Amer: 25 mL/min — ABNORMAL LOW (ref >60)
GFR, EST AFRICAN AMERICAN: 29 mL/min — AB (ref >60)
Glucose, Bld: 168 mg/dL — ABNORMAL HIGH (ref 65–99)
POTASSIUM: 4.6 mmol/L (ref 3.5–5.1)
Sodium: 140 mmol/L (ref 135–145)

## 2015-07-07 LAB — URINALYSIS, ROUTINE W REFLEX MICROSCOPIC
Bilirubin Urine: NEGATIVE
Glucose, UA: NEGATIVE mg/dL
Hgb urine dipstick: NEGATIVE
KETONES UR: NEGATIVE mg/dL
LEUKOCYTES UA: NEGATIVE
NITRITE: NEGATIVE
PH: 7.5 (ref 5.0–8.0)
PROTEIN: NEGATIVE mg/dL
Specific Gravity, Urine: 1.013 (ref 1.005–1.030)

## 2015-07-07 LAB — CBC WITH DIFFERENTIAL/PLATELET
Basophils Absolute: 0 10*3/uL (ref 0.0–0.1)
Basophils Relative: 0 %
Eosinophils Absolute: 0.3 10*3/uL (ref 0.0–0.7)
Eosinophils Relative: 4 %
HEMATOCRIT: 33.6 % — AB (ref 39.0–52.0)
HEMOGLOBIN: 11 g/dL — AB (ref 13.0–17.0)
LYMPHS ABS: 1 10*3/uL (ref 0.7–4.0)
Lymphocytes Relative: 13 %
MCH: 29.3 pg (ref 26.0–34.0)
MCHC: 32.7 g/dL (ref 30.0–36.0)
MCV: 89.6 fL (ref 78.0–100.0)
MONOS PCT: 7 %
Monocytes Absolute: 0.5 10*3/uL (ref 0.1–1.0)
NEUTROS ABS: 5.7 10*3/uL (ref 1.7–7.7)
NEUTROS PCT: 76 %
Platelets: 144 10*3/uL — ABNORMAL LOW (ref 150–400)
RBC: 3.75 MIL/uL — AB (ref 4.22–5.81)
RDW: 14.1 % (ref 11.5–15.5)
WBC: 7.5 10*3/uL (ref 4.0–10.5)

## 2015-07-07 LAB — TSH: TSH: 2.946 u[IU]/mL (ref 0.350–4.500)

## 2015-07-07 LAB — MAGNESIUM: MAGNESIUM: 3 mg/dL — AB (ref 1.7–2.4)

## 2015-07-07 LAB — HEPATIC FUNCTION PANEL
ALBUMIN: 4.1 g/dL (ref 3.5–5.0)
ALT: 18 U/L (ref 17–63)
AST: 26 U/L (ref 15–41)
Alkaline Phosphatase: 72 U/L (ref 38–126)
Bilirubin, Direct: 0.1 mg/dL (ref 0.1–0.5)
Indirect Bilirubin: 0.7 mg/dL (ref 0.3–0.9)
Total Bilirubin: 0.8 mg/dL (ref 0.3–1.2)
Total Protein: 7.4 g/dL (ref 6.5–8.1)

## 2015-07-07 LAB — CBG MONITORING, ED
GLUCOSE-CAPILLARY: 159 mg/dL — AB (ref 65–99)
GLUCOSE-CAPILLARY: 172 mg/dL — AB (ref 65–99)

## 2015-07-07 LAB — CREATININE, SERUM
Creatinine, Ser: 2.06 mg/dL — ABNORMAL HIGH (ref 0.61–1.24)
GFR calc Af Amer: 31 mL/min — ABNORMAL LOW (ref >60)
GFR calc non Af Amer: 26 mL/min — ABNORMAL LOW (ref >60)

## 2015-07-07 LAB — BRAIN NATRIURETIC PEPTIDE: B Natriuretic Peptide: 459.1 pg/mL — ABNORMAL HIGH (ref 0.0–100.0)

## 2015-07-07 MED ORDER — OXYCODONE HCL 5 MG/5ML PO SOLN
7.5000 mg | Freq: Four times a day (QID) | ORAL | Status: DC | PRN
  Administered 2015-07-07 – 2015-07-08 (×3): 7.5 mg via ORAL
  Filled 2015-07-07 (×3): qty 10

## 2015-07-07 MED ORDER — ACETAMINOPHEN 325 MG PO TABS: 650.0000 mg | ORAL_TABLET | Freq: Four times a day (QID) | ORAL | Status: DC | PRN

## 2015-07-07 MED ORDER — POLYETHYLENE GLYCOL 3350 17 G PO PACK
17.0000 g | PACK | Freq: Every day | ORAL | Status: DC | PRN
  Filled 2015-07-07: qty 1

## 2015-07-07 MED ORDER — ONDANSETRON HCL 4 MG/2ML IJ SOLN
4.0000 mg | Freq: Four times a day (QID) | INTRAMUSCULAR | Status: DC | PRN
  Administered 2015-07-07: 4 mg via INTRAVENOUS
  Filled 2015-07-07: qty 2

## 2015-07-07 MED ORDER — MORPHINE SULFATE (PF) 4 MG/ML IV SOLN
4.0000 mg | Freq: Once | INTRAVENOUS | Status: AC
  Administered 2015-07-07: 4 mg via INTRAVENOUS
  Filled 2015-07-07: qty 1

## 2015-07-07 MED ORDER — OXYCODONE-ACETAMINOPHEN 5-325 MG PO TABS: 1.0000 | ORAL_TABLET | Freq: Four times a day (QID) | ORAL | Status: DC | PRN

## 2015-07-07 MED ORDER — FERROUS SULFATE 325 (65 FE) MG PO TABS
325.0000 mg | ORAL_TABLET | Freq: Every day | ORAL | Status: DC
  Administered 2015-07-09 – 2015-07-11 (×3): 325 mg via ORAL
  Filled 2015-07-07 (×4): qty 1

## 2015-07-07 MED ORDER — ATORVASTATIN CALCIUM 20 MG PO TABS
20.0000 mg | ORAL_TABLET | Freq: Every day | ORAL | Status: DC
  Administered 2015-07-07 – 2015-07-10 (×4): 20 mg via ORAL
  Filled 2015-07-07 (×4): qty 1

## 2015-07-07 MED ORDER — ONDANSETRON HCL 4 MG/2ML IJ SOLN: 4.0000 mg | Freq: Three times a day (TID) | INTRAMUSCULAR | Status: DC | PRN

## 2015-07-07 MED ORDER — HYDROCODONE-ACETAMINOPHEN 5-325 MG PO TABS: 1.0000 | ORAL_TABLET | ORAL | Status: DC | PRN

## 2015-07-07 MED ORDER — DOCUSATE SODIUM 100 MG PO CAPS
100.0000 mg | ORAL_CAPSULE | Freq: Two times a day (BID) | ORAL | Status: DC
  Administered 2015-07-07 – 2015-07-11 (×7): 100 mg via ORAL
  Filled 2015-07-07 (×8): qty 1

## 2015-07-07 MED ORDER — LORAZEPAM 2 MG/ML IJ SOLN
0.5000 mg | Freq: Once | INTRAMUSCULAR | Status: DC
  Filled 2015-07-07: qty 1

## 2015-07-07 MED ORDER — SERTRALINE HCL 25 MG PO TABS
25.0000 mg | ORAL_TABLET | Freq: Every day | ORAL | Status: DC
  Administered 2015-07-09 – 2015-07-11 (×3): 25 mg via ORAL
  Filled 2015-07-07 (×3): qty 1

## 2015-07-07 MED ORDER — ACETAMINOPHEN 160 MG/5ML PO SOLN
325.0000 mg | Freq: Four times a day (QID) | ORAL | Status: DC | PRN
  Administered 2015-07-07: 320 mg via ORAL
  Filled 2015-07-07 (×2): qty 10.2

## 2015-07-07 MED ORDER — SODIUM CHLORIDE 0.9 % IJ SOLN
3.0000 mL | Freq: Two times a day (BID) | INTRAMUSCULAR | Status: DC
  Administered 2015-07-07 – 2015-07-11 (×2): 3 mL via INTRAVENOUS

## 2015-07-07 MED ORDER — ONDANSETRON HCL 4 MG PO TABS: 4.0000 mg | ORAL_TABLET | Freq: Four times a day (QID) | ORAL | Status: DC | PRN

## 2015-07-07 MED ORDER — CARVEDILOL 25 MG PO TABS
25.0000 mg | ORAL_TABLET | Freq: Two times a day (BID) | ORAL | Status: DC
  Administered 2015-07-08 – 2015-07-10 (×5): 25 mg via ORAL
  Filled 2015-07-07 (×5): qty 1

## 2015-07-07 MED ORDER — ENOXAPARIN SODIUM 30 MG/0.3ML ~~LOC~~ SOLN
30.0000 mg | SUBCUTANEOUS | Status: DC
  Filled 2015-07-07: qty 0.3

## 2015-07-07 MED ORDER — LEVALBUTEROL HCL 0.63 MG/3ML IN NEBU: 0.6300 mg | INHALATION_SOLUTION | Freq: Four times a day (QID) | RESPIRATORY_TRACT | Status: DC | PRN

## 2015-07-07 MED ORDER — VITAMIN C 500 MG PO TABS
500.0000 mg | ORAL_TABLET | Freq: Every day | ORAL | Status: DC
  Administered 2015-07-09 – 2015-07-11 (×3): 500 mg via ORAL
  Filled 2015-07-07 (×3): qty 1

## 2015-07-07 MED ORDER — FINASTERIDE 5 MG PO TABS
5.0000 mg | ORAL_TABLET | Freq: Every day | ORAL | Status: DC
  Administered 2015-07-09 – 2015-07-11 (×3): 5 mg via ORAL
  Filled 2015-07-07 (×3): qty 1

## 2015-07-07 MED ORDER — SODIUM CHLORIDE 0.9 % IV SOLN
INTRAVENOUS | Status: DC
  Administered 2015-07-07: 23:00:00 via INTRAVENOUS

## 2015-07-07 MED ORDER — VITAMIN B-12 1000 MCG PO TABS
1000.0000 ug | ORAL_TABLET | Freq: Every day | ORAL | Status: DC
  Administered 2015-07-09 – 2015-07-11 (×3): 1000 ug via ORAL
  Filled 2015-07-07 (×3): qty 1

## 2015-07-07 MED ORDER — ONDANSETRON HCL 4 MG/2ML IJ SOLN
4.0000 mg | Freq: Once | INTRAMUSCULAR | Status: AC
  Administered 2015-07-07: 4 mg via INTRAVENOUS
  Filled 2015-07-07: qty 2

## 2015-07-07 MED ORDER — DOXAZOSIN MESYLATE 4 MG PO TABS
4.0000 mg | ORAL_TABLET | Freq: Two times a day (BID) | ORAL | Status: DC
Start: 2015-07-07 — End: 2015-07-11
  Administered 2015-07-07 – 2015-07-11 (×7): 4 mg via ORAL
  Filled 2015-07-07 (×10): qty 1

## 2015-07-07 MED ORDER — OXYCODONE HCL 5 MG PO TABS: 2.5000 mg | ORAL_TABLET | Freq: Four times a day (QID) | ORAL | Status: DC | PRN

## 2015-07-07 MED ORDER — SODIUM CHLORIDE 0.9 % IV SOLN
INTRAVENOUS | Status: DC
  Administered 2015-07-07 – 2015-07-10 (×3): via INTRAVENOUS

## 2015-07-07 MED ORDER — ALUM & MAG HYDROXIDE-SIMETH 200-200-20 MG/5ML PO SUSP: 30.0000 mL | Freq: Four times a day (QID) | ORAL | Status: DC | PRN

## 2015-07-07 MED ORDER — HYDROMORPHONE HCL 1 MG/ML IJ SOLN
1.0000 mg | INTRAMUSCULAR | Status: DC | PRN
  Administered 2015-07-07: 1 mg via INTRAVENOUS
  Filled 2015-07-07: qty 1

## 2015-07-07 MED ORDER — OXYCODONE-ACETAMINOPHEN 7.5-325 MG PO TABS: 1.0000 | ORAL_TABLET | Freq: Four times a day (QID) | ORAL | Status: DC | PRN

## 2015-07-07 MED ORDER — MIRTAZAPINE 15 MG PO TABS
15.0000 mg | ORAL_TABLET | Freq: Every day | ORAL | Status: DC
  Administered 2015-07-07 – 2015-07-10 (×4): 15 mg via ORAL
  Filled 2015-07-07 (×4): qty 1

## 2015-07-07 MED ORDER — POLYETHYLENE GLYCOL 3350 17 G PO PACK
17.0000 g | PACK | Freq: Every day | ORAL | Status: DC
  Administered 2015-07-07 – 2015-07-10 (×4): 17 g via ORAL
  Filled 2015-07-07 (×4): qty 1

## 2015-07-07 MED ORDER — ENOXAPARIN SODIUM 30 MG/0.3ML ~~LOC~~ SOLN
30.0000 mg | SUBCUTANEOUS | Status: DC
  Administered 2015-07-08 – 2015-07-09 (×2): 30 mg via SUBCUTANEOUS
  Filled 2015-07-07 (×5): qty 0.3

## 2015-07-07 MED ORDER — ASPIRIN EC 81 MG PO TBEC: 81.0000 mg | DELAYED_RELEASE_TABLET | Freq: Every day | ORAL | Status: DC

## 2015-07-07 MED ORDER — ACETAMINOPHEN 650 MG RE SUPP: 650.0000 mg | Freq: Four times a day (QID) | RECTAL | Status: DC | PRN

## 2015-07-07 NOTE — ED Notes (Signed)
Bed: WA17 Expected date:  Expected time:  Means of arrival:  Comments: EMS 82yo M ; fall hip pain

## 2015-07-07 NOTE — Consult Note (Signed)
Joshua Nakayama, MD  Chauncey Cruel, PA-C  Loni Dolly, PA-C                                  [facility]                [address] [place], [place]  [address]   ORTHOPAEDIC CONSULTATION  Mikeal Florencio            MRN:  [id number] DOB/SEX:  1921/02/15/male     CHIEF COMPLAINT:  Painful left hip  HISTORY: Grzegorz Hamor a 82 y.o. male with broken hip.  Fell in bathroom at home early this morning in [place].  Was taken to [facility] where hip fracture was diagnosed.  Was transferred to [facility] for surgery tomorrow.  Admitted to medical service.  Denies pain elsewhere including at his neck.  No LOC   PAST MEDICAL HISTORY: Patient Active Problem List   Diagnosis Date Noted  . Hip fracture ([place]) 07/07/2015  . Peripheral vascular disease, unspecified ([place]) 12/12/2012  . Aftercare following surgery of the circulatory system, [place] 12/12/2012  . Memory deficit 11/29/2012  . Carpal tunnel syndrome 07/18/2012  . Spinal stenosis, lumbar region, without neurogenic claudication 07/18/2012  . Other vitamin B12 deficiency anemia 07/18/2012  . Other general symptoms(780.99) 07/18/2012  . Abnormality of gait 07/18/2012  . Diabetic polyneuropathy ([place]) 07/18/2012  . CHF, acute on chronic ([place]) 05/26/2012  . Leukocytosis 05/25/2012  . Anemia 05/25/2012  . Hyponatremia 05/25/2012  . CHF (congestive heart failure) ([place]) 05/24/2012  . PVD (peripheral vascular disease) ([place]) 05/24/2012  . CAD (coronary artery disease) 05/24/2012  . CKD (chronic kidney disease) stage 3, GFR 30-59 ml/min 05/24/2012  . Diabetes mellitus type II, controlled ([place]) 05/24/2012  . HTN (hypertension) 05/24/2012  . Bursitis of right hip 08/13/2011   Past Medical History  Diagnosis Date  . Kidney disease   . Diabetes mellitus   . Hypertension   . MI (myocardial infarction) ([place])     1974  . Peripheral vascular disease ([place])     veinsi rerouted in both legs   . Pneumonia     hx of   . Anemia   . Blood  transfusion     hx of 6 months ago   . GERD (gastroesophageal reflux disease)   . Arthritis   . Bowel obstruction ([place])   . PONV (postoperative nausea and vomiting)   . Gait disorder   . Depression   . Dyslipidemia   . Heart disease (organic)   . C. difficile colitis   . History of GI bleed     Upper  . Lumbosacral spinal stenosis   . Memory deficit 11/29/2012  . Degenerative arthritis   . Peptic ulcer disease   . Wears glasses   . HOH (hard of hearing)    Past Surgical History  Procedure Laterality Date  . Back surgery      back surgery x 2   . Eye surgery      bilateral cataract surgery with implants   . Other surgical history      2 fatty tumors removed - on different arms   . Other surgical history      prostate surgery x 3   . Other surgical history      veins rerouted in both legs   . Other surgical history      hemorrhoid surgery  . Other surgical history  carpal tunnel right   . Other surgical history      big toe nails removed   . Other surgical history      repair of blood vessel in esophagus and stomach- 2006  . Other surgical history      bilateral kidney stents  2007  . Other surgical history      small bowel obstruction 2011   . Excision/release bursa hip  08/13/2011    Procedure: EXCISION/RELEASE BURSA HIP;  Surgeon: Tobi Bastos, MD;  [facility];  Service: Orthopedics;  Laterality: Right;  Right Hip Excision Tumor  . Esophageal manometry  11/08/2011    Procedure: ESOPHAGEAL MANOMETRY (EM);  Surgeon: Beryle Beams, MD;  [facility];  Service: Endoscopy;  Laterality: N/A;  . Hemorrhoid surgery    . Cataract extraction Bilateral   . Lipoma resection    . Transurethral resection of prostate      For benign prostate hypertrophy  . Carpal tunnel release Bilateral   . Ureteral stent placement Bilateral   . Cervical laminectomy    . Skin cancer resection      Left forehead  . Right hip surgery    . Femoral-popliteal bypass  graft Left 07-08-1997    Curt Jews MD  . Femoral-popliteal bypass graft Right 06-04-1997    Curt Jews MD  . Carpal tunnel release Left 11/20/2013    Procedure: LEFT  CARPAL TUNNEL RELEASE;  Surgeon: Wynonia Sours, MD;  Location: [facility];  Service: Orthopedics;  Laterality: Left;  . Hypothenar fat pad transfer Left 11/20/2013    Procedure: HYPOTHENAR FAT PAD TRANSFER LEFT;  Surgeon: Wynonia Sours, MD;  Location: [facility];  Service: Orthopedics;  Laterality: Left;     MEDICATIONS:   Current facility-administered medications:  .  0.9 %  sodium chloride infusion, , Intravenous, Continuous, Reyne Dumas, MD, Last Rate: 100 mL/hr at 07/07/15 1021 .  0.9 %  sodium chloride infusion, , Intravenous, Continuous, Reyne Dumas, MD, 0  at 07/07/15 1021 .  oxyCODONE (ROXICODONE) 5 MG/5ML solution 7.5 mg, 7.5 mg, Oral, Q6H PRN, 7.5 mg at 07/07/15 1553 **AND** acetaminophen (TYLENOL) solution 325 mg, 325 mg, Oral, Q6H PRN, Reyne Dumas, MD .  acetaminophen (TYLENOL) tablet 650 mg, 650 mg, Oral, Q6H PRN **OR** acetaminophen (TYLENOL) suppository 650 mg, 650 mg, Rectal, Q6H PRN, Reyne Dumas, MD .  alum & mag hydroxide-simeth (MAALOX/MYLANTA) 200-200-20 MG/5ML suspension 30 mL, 30 mL, Oral, Q6H PRN, Reyne Dumas, MD .  Derrill Memo ON 07/08/2015] aspirin EC tablet 81 mg, 81 mg, Oral, QAC breakfast, Reyne Dumas, MD .  atorvastatin (LIPITOR) tablet 20 mg, 20 mg, Oral, Daily, Reyne Dumas, MD, Stopped at 07/07/15 1601 .  carvedilol (COREG) tablet 25 mg, 25 mg, Oral, BID WC, Reyne Dumas, MD, 25 mg at 07/07/15 1705 .  docusate sodium (COLACE) capsule 100 mg, 100 mg, Oral, BID, Reyne Dumas, MD, 100 mg at 07/07/15 1029 .  doxazosin (CARDURA) tablet 4 mg, 4 mg, Oral, BID, Reyne Dumas, MD, 4 mg at 07/07/15 1602 .  [START ON 07/08/2015] enoxaparin (LOVENOX) injection 30 mg, 30 mg, Subcutaneous, Q24H, Reyne Dumas, MD .  Derrill Memo ON 07/08/2015] ferrous sulfate tablet 325 mg, 325 mg, Oral, Q  breakfast, Reyne Dumas, MD .  finasteride (PROSCAR) tablet 5 mg, 5 mg, Oral, Daily, Reyne Dumas, MD, 5 mg at 07/07/15 1602 .  HYDROcodone-acetaminophen (NORCO/VICODIN) 5-325 MG per tablet 1-2 tablet, 1-2 tablet, Oral, Q4H PRN, Lacretia Leigh, MD .  HYDROmorphone (DILAUDID) injection 1 mg, 1 mg, Intravenous, Q4H PRN, Reyne Dumas, MD, 1 mg at 07/07/15 1029 .  levalbuterol (XOPENEX) nebulizer solution 0.63 mg, 0.63 mg, Nebulization, Q6H PRN, Reyne Dumas, MD .  LORazepam (ATIVAN) injection 0.5 mg, 0.5 mg, Intravenous, Once, Reyne Dumas, MD, Stopped at 07/07/15 1912 .  mirtazapine (REMERON) tablet 15 mg, 15 mg, Oral, QHS, Reyne Dumas, MD .  ondansetron (ZOFRAN) injection 4 mg, 4 mg, Intravenous, Q8H PRN, Lacretia Leigh, MD .  ondansetron [facility]) tablet 4 mg, 4 mg, Oral, Q6H PRN **OR** ondansetron (ZOFRAN) injection 4 mg, 4 mg, Intravenous, Q6H PRN, Reyne Dumas, MD, 4 mg at 07/07/15 1455 .  oxyCODONE-acetaminophen (PERCOCET) 7.5-325 MG per tablet 1 tablet, 1 tablet, Oral, Q6H PRN, Reyne Dumas, MD .  polyethylene glycol (MIRALAX / GLYCOLAX) packet 17 g, 17 g, Oral, QHS, Nayana Abrol, MD .  polyethylene glycol (MIRALAX / GLYCOLAX) packet 17 g, 17 g, Oral, Daily PRN, Reyne Dumas, MD .  sertraline (ZOLOFT) tablet 25 mg, 25 mg, Oral, Daily, Reyne Dumas, MD, 25 mg at 07/07/15 1602 .  sodium chloride 0.9 % injection 3 mL, 3 mL, Intravenous, Q12H, Reyne Dumas, MD, 3 mL at 07/07/15 1029 .  [START ON 07/08/2015] vitamin B-12 (CYANOCOBALAMIN) tablet 1,000 mcg, 1,000 mcg, Oral, QAC breakfast, Reyne Dumas, MD .  Derrill Memo ON 07/08/2015] vitamin C (ASCORBIC ACID) tablet 500 mg, 500 mg, Oral, QAC breakfast, Reyne Dumas, MD  ALLERGIES:   Allergies  Allergen Reactions  . Amoxicillin Swelling    Has patient had a PCN reaction causing immediate rash, facial/tongue/throat swelling, SOB or lightheadedness with hypotension: Unknown Has patient had a PCN reaction causing severe rash involving mucus membranes or skin  necrosis: Unknown Has patient had a PCN reaction that required hospitalization: Unknown Has patient had a PCN reaction occurring within the last 10 years: Unknown If all of the above answers are "NO", then may proceed with Cephalosporin use.   Marland Kitchen Apresoline Esidrix [Hydralazine-Hctz] Itching and Swelling  . Hctz [Hydrochlorothiazide] Other (See Comments)    hyponatremia  . Tetracyclines & Related   . Levaquin [Levofloxacin In D5w]     Unknown reaction  . Serzone [Nefazodone] Other (See Comments)    Felt bad  . Biaxin [Clarithromycin] Rash  . Other Itching    CT CONTRAST DYE    REVIEW OF SYSTEMS: REVIEWED IN DETAIL IN CHART  FAMILY HISTORY:   Family History  Problem Relation Age of Onset  . Cancer Sister     SOCIAL HISTORY:   Social History  Substance Use Topics  . Smoking status: Former Smoker    Quit date: 07/22/1972  . Smokeless tobacco: Never Used  . Alcohol Use: No     EXAMINATION: Vital signs in last 24 hours: Temp:  [97.9 F (36.6 C)-98.2 F (36.8 C)] 98.1 F (36.7 C) (01/16 1013) Pulse Rate:  [52-71] 71 (01/16 1946) Resp:  [14-22] 18 (01/16 2000) BP: (103-169)/(49-76) 111/54 mmHg (01/16 2000) SpO2:  [88 %-98 %] 95 % (01/16 1946)  BP 111/54 mmHg  Pulse 71  Temp(Src) 98.1 F (36.7 C) (Oral)  Resp 18  SpO2 95%  General Appearance:    Alert, cooperative, no distress, appears stated age  Head:    Normocephalic, without obvious abnormality, atraumatic  Eyes:    PERRL, conjunctiva/corneas clear, EOM's intact, fundi    benign, both eyes       Ears:    Normal TM's and external ear canals, both ears  Nose:   Nares normal, septum midline,  mucosa normal, no drainage    or sinus tenderness  Throat:   Lips, mucosa, and tongue no; teeth and gums normal  Neck:   Supple, symmetrical, trachea midline, no adenopathy;       thyroid:  No enlargement/tenderness/nodules; no carotid   bruit or JVD, moves neck fully  Back:     Symmetric, no curvature, ROM normal, no CVA  tenderness  Lungs:     Clear to auscultation bilaterally, respirations unlabored  Chest wall:    No tenderness or deformity  Heart:    Regular rate and rhythm, S1 and S2 normal, no murmur, rub   or gallop  Abdomen:     Soft, non-tender, bowel sounds active all four quadrants,    no masses, no organomegaly  Genitalia:    Rectal:    Extremities:   Extremities normal, atraumatic, no cyanosis or edema  Pulses:   2+ and symmetric all extremities  Skin:   Skin color, texture, turgor normal, no rashes or lesions  Lymph nodes:   Cervical, supraclavicular, and axillary nodes normal  Neurologic:   CNII-XII intact. Normal strength, sensation and reflexes      throughout    Musculoskeletal Exam:   Left leg SER, calves soft, UE move fully   DIAGNOSTIC STUDIES: Recent laboratory studies:  Recent Labs  07/07/15 0741 07/07/15 1121  WBC 7.5 14.3*  HGB 11.0* 11.8*  HCT 33.6* 37.1*  PLT 144* 151    Recent Labs  07/07/15 0741 07/07/15 1121  NA 140  --   K 4.6  --   CL 99*  --   CO2 27  --   BUN 50*  --   CREATININE 2.14* 2.06*  GLUCOSE 168*  --   CALCIUM 8.8*  --    Lab Results  Component Value Date   INR 1.13 08/09/2011     Recent Radiographic Studies :  Dg Chest 1 View  07/07/2015  CLINICAL DATA:  Preoperative for left femur fracture repair. EXAM: CHEST 1 VIEW COMPARISON:  05/24/2012 chest radiograph. FINDINGS: Partially visualized is surgical plate with interlocking screws overlying the lower cervical spine. Stable cardiomediastinal silhouette with normal heart size. No pneumothorax. No pleural effusion. Stable biapical pleural-parenchymal scarring, asymmetric to the right. There is faint focal round opacity in the right midlung. IMPRESSION: Faint focal round opacity in the right midlung, cannot exclude a pulmonary nodule. Correlation with chest CT is advised. Electronically Signed   By: Ilona Sorrel M.D.   On: 07/07/2015 08:26   Dg Wrist Complete Right  07/07/2015  CLINICAL  DATA:  Fall with right wrist pain EXAM: RIGHT WRIST - COMPLETE 3+ VIEW COMPARISON:  None. FINDINGS: There is prominent soft tissue swelling in the radial distal right forearm. No fracture, dislocation or suspicious focal osseous lesion. Severe osteoarthritis in the scaphoid- trapezium, first carpometacarpal and first metacarpophalangeal joints. Vascular calcifications throughout the soft tissues. IMPRESSION: Prominent soft tissue swelling in the radial distal right forearm. No fracture or dislocation in the right wrist. Polyarticular osteoarthritis as described. Electronically Signed   By: Ilona Sorrel M.D.   On: 07/07/2015 08:22   Dg Hip Unilat With Pelvis 2-3 Views Left  07/07/2015  CLINICAL DATA:  Fall with left hip pain.  Initial encounter. EXAM: DG HIP (WITH OR WITHOUT PELVIS) 2-3V LEFT COMPARISON:  None. FINDINGS: Subcapital left femoral neck fracture with lateral displacement and mild posterior impaction with anterior fracture widening. The left femoral head is located. No notable degenerative changes to the acetabulum. The right hip  is intact and located. No evidence of pelvic ring fracture or diastasis. Osteopenia and extensive atherosclerosis. IMPRESSION: Subcapital left femoral neck fracture. Electronically Signed   By: Monte Fantasia M.D.   On: 07/07/2015 07:47    ASSESSMENT:  Left hip femoral neck fracture   PLAN:  Needs hemi hip in hopes of sitting and getting OOB.  Marginal walker with a walker prior to this fall so may never walk again.  Will likely need rehab stay postop.  Plan on surgery tomorrow.  Hoping for spinal but up to anesthesia.  Reviewed risks with patient and his wife.  Athanasia Stanwood G 07/07/2015, 10:02 PM

## 2015-07-07 NOTE — ED Provider Notes (Signed)
5:19 PM Asked by nurse to see patient due to complaint of dyspnea. Patient states he just really started feeling short of breath. He has been waiting in the ER for a bed at [facility] due to a femur fracture. Patient is on 2 L of oxygen, oxygen saturation in between 94 and 98%. Breathing about 16-18 respirations per minute. Appears in no acute distress. On exam his lungs are completely clear. Unclear why he is feeling short of breath but with no tachycardia or new hypoxia, will very low suspicion for a pulmonary or fat embolus. Provided reassurance, will continue to watch. I have asked the nurse to try to continue to get in touch with the hospitalist about this patient.  Sherwood Gambler, MD 07/07/15 [phone]

## 2015-07-07 NOTE — H&P (Addendum)
[facility] History and Physical  Jashad Ganus [id number] DOB: 10/22/1920 DOA: 07/07/2015  Referring physician:  PCP: Horatio Pel, MD   Chief Complaint: Fall and hip pain HPI:  82 year old male, with known history of DM-2, HTN, CAD, s/p MI 1974, CHF, PVD, dyslipidemia, GERD, OA, s/p back surgery x 2, s/p right carpal tunnel surgery, s/p hemorrhoidectomy, BPH/obstructive uropathy, s/p prostate surgery, PVD, s/p revascularization, bilateral LE, s/p bilateral renal artery stents, CKD stage III, presents to the ER after a mechanical fall while trying to get out of bed and losing his balance, and falling on the left hip without any loss of consciousness. Patient uses a walker at baseline. He is followed by [facility] neurology for his severe diabetic peripheral neuropathy affecting his hips and knees, associated with a lot of pain with weightbearing. He has had balance issues and uses a wheelchair outside the house. Recently the patient has also had some memory issues, severe constipation related to Percocet, numbness of the left hand, recently placed in a wrist splint. Workup in the ER reveals that the patient slightly dehydrated creatinine of 2.14, baseline 1.5, x-ray of the left hip reveals a subcapital left femoral neck fracture. Orthopedics , Dr. Berenice Primas has been notified      Review of Systems: negative for the following  Constitutional: Denies fever, chills, diaphoresis, appetite change and fatigue.  HEENT: Denies photophobia, eye pain, redness, hearing loss, ear pain, congestion, sore throat, rhinorrhea, sneezing, mouth sores, trouble swallowing, neck pain, neck stiffness and tinnitus.  Respiratory: Denies SOB, DOE, cough, chest tightness, and wheezing.  Cardiovascular: Denies chest pain, palpitations and leg swelling.  Gastrointestinal: Denies nausea, vomiting, abdominal pain, diarrhea, constipation, blood in stool and abdominal distention.  Genitourinary: Denies dysuria,  urgency, frequency, hematuria, flank pain and difficulty urinating.  Musculoskeletal: left hip pain, back pain, joint swelling, arthralgias and gait problem.  Skin: Denies pallor, rash and wound.  Neurological: Denies dizziness, seizures, syncope, weakness, light-headedness, numbness and headaches.  Hematological: Denies adenopathy. Easy bruising, personal or family bleeding history  Psychiatric/Behavioral: Denies suicidal ideation, mood changes, confusion, nervousness, sleep disturbance and agitation       Past Medical History  Diagnosis Date  . Kidney disease   . Diabetes mellitus   . Hypertension   . MI (myocardial infarction) ([place])     1974  . Peripheral vascular disease ([place])     veinsi rerouted in both legs   . Pneumonia     hx of   . Anemia   . Blood transfusion     hx of 6 months ago   . GERD (gastroesophageal reflux disease)   . Arthritis   . Bowel obstruction ([place])   . PONV (postoperative nausea and vomiting)   . Gait disorder   . Depression   . Dyslipidemia   . Heart disease (organic)   . C. difficile colitis   . History of GI bleed     Upper  . Lumbosacral spinal stenosis   . Memory deficit 11/29/2012  . Degenerative arthritis   . Peptic ulcer disease   . Wears glasses   . HOH (hard of hearing)      Past Surgical History  Procedure Laterality Date  . Back surgery      back surgery x 2   . Eye surgery      bilateral cataract surgery with implants   . Other surgical history      2 fatty tumors removed - on different arms   . Other  surgical history      prostate surgery x 3   . Other surgical history      veins rerouted in both legs   . Other surgical history      hemorrhoid surgery  . Other surgical history      carpal tunnel right   . Other surgical history      big toe nails removed   . Other surgical history      repair of blood vessel in esophagus and stomach- 2006  . Other surgical history      bilateral kidney stents  2007  . Other  surgical history      small bowel obstruction 2011   . Excision/release bursa hip  08/13/2011    Procedure: EXCISION/RELEASE BURSA HIP;  Surgeon: Tobi Bastos, MD;  Location: [facility];  Service: Orthopedics;  Laterality: Right;  Right Hip Excision Tumor  . Esophageal manometry  11/08/2011    Procedure: ESOPHAGEAL MANOMETRY (EM);  Surgeon: Beryle Beams, MD;  Location: [facility];  Service: Endoscopy;  Laterality: N/A;  . Hemorrhoid surgery    . Cataract extraction Bilateral   . Lipoma resection    . Transurethral resection of prostate      For benign prostate hypertrophy  . Carpal tunnel release Bilateral   . Ureteral stent placement Bilateral   . Cervical laminectomy    . Skin cancer resection      Left forehead  . Right hip surgery    . Femoral-popliteal bypass graft Left 07-08-1997    Curt Jews MD  . Femoral-popliteal bypass graft Right 06-04-1997    Curt Jews MD  . Carpal tunnel release Left 11/20/2013    Procedure: LEFT  CARPAL TUNNEL RELEASE;  Surgeon: Wynonia Sours, MD;  Location: [facility];  Service: Orthopedics;  Laterality: Left;  . Hypothenar fat pad transfer Left 11/20/2013    Procedure: HYPOTHENAR FAT PAD TRANSFER LEFT;  Surgeon: Wynonia Sours, MD;  Location: [place];  Service: Orthopedics;  Laterality: Left;      Social History:  reports that he quit smoking about 42 years ago. He has never used smokeless tobacco. He reports that he does not drink alcohol or use illicit drugs.    Allergies  Allergen Reactions  . Amoxicillin Swelling    Has patient had a PCN reaction causing immediate rash, facial/tongue/throat swelling, SOB or lightheadedness with hypotension: Unknown Has patient had a PCN reaction causing severe rash involving mucus membranes or skin necrosis: Unknown Has patient had a PCN reaction that required hospitalization: Unknown Has patient had a PCN reaction occurring within the last 10 years: Unknown If all of the above  answers are "NO", then may proceed with Cephalosporin use.   Marland Kitchen Apresoline Esidrix [Hydralazine-Hctz] Itching and Swelling  . Hctz [Hydrochlorothiazide] Other (See Comments)    hyponatremia  . Tetracyclines & Related   . Levaquin [Levofloxacin In D5w]     Unknown reaction  . Serzone [Nefazodone] Other (See Comments)    Felt bad  . Biaxin [Clarithromycin] Rash  . Other Itching    CT CONTRAST DYE    Family History  Problem Relation Age of Onset  . Cancer Sister          Prior to Admission medications   Medication Sig Start Date End Date Taking? Authorizing Provider  aspirin EC 81 MG tablet Take 81 mg by mouth daily before breakfast.    Yes Historical Provider, MD  atorvastatin (LIPITOR) 20 MG  tablet Take 20 mg by mouth daily.   Yes Historical Provider, MD  carvedilol (COREG) 25 MG tablet Take 25 mg by mouth 2 (two) times daily with a meal.   Yes Historical Provider, MD  doxazosin (CARDURA) 4 MG tablet Take 4 mg by mouth 2 (two) times daily.    Yes Historical Provider, MD  ferrous sulfate 325 (65 FE) MG tablet Take 325 mg by mouth daily with breakfast.   Yes Historical Provider, MD  finasteride (PROSCAR) 5 MG tablet Take 5 mg by mouth daily. 11/20/12  Yes Historical Provider, MD  furosemide (LASIX) 40 MG tablet Take 40 mg by mouth 3 (three) times daily.    Yes Historical Provider, MD  losartan (COZAAR) 100 MG tablet Take 50 mg by mouth daily.    Yes Historical Provider, MD  magnesium hydroxide (MILK OF MAGNESIA) 400 MG/5ML suspension Take 7.5 mLs by mouth at bedtime.   Yes Historical Provider, MD  mirtazapine (REMERON) 15 MG tablet Take 15 mg by mouth at bedtime.   Yes Historical Provider, MD  Multiple Vitamin (MULITIVITAMIN WITH MINERALS) TABS Take 1 tablet by mouth daily before breakfast.    Yes Historical Provider, MD  oxyCODONE-acetaminophen (PERCOCET) 7.5-325 MG tablet Take 1 tablet by mouth every 6 (six) hours as needed. Patient taking differently: Take 1 tablet by mouth at  bedtime.  06/27/15  Yes Kathrynn Ducking, MD  polyethylene glycol [facility] / Floria Raveling) packet Take 17 g by mouth at bedtime.    Yes Historical Provider, MD  senna (SENOKOT) 8.6 MG tablet Take 1 tablet by mouth 4 (four) times daily.   Yes Historical Provider, MD  sertraline (ZOLOFT) 25 MG tablet Take 25 mg by mouth daily. 11/21/12  Yes Historical Provider, MD  UNABLE TO FIND Apply 1 application topically daily. Med Name: Magnlife DB-foot cream   Yes Historical Provider, MD  vitamin B-12 (CYANOCOBALAMIN) 1000 MCG tablet Take 1,000 mcg by mouth daily before breakfast.    Yes Historical Provider, MD  vitamin C (ASCORBIC ACID) 500 MG tablet Take 500 mg by mouth daily before breakfast.    Yes Historical Provider, MD  lidocaine (XYLOCAINE) 5 % ointment Apply 1 application topically as needed. Patient not taking: Reported on 07/07/2015 06/27/15   Kathrynn Ducking, MD     Physical Exam: Filed Vitals:   07/07/15 [id number] 07/07/15 0755 07/07/15 0759 07/07/15 0818  BP: 169/64 119/55  125/59  Pulse: 63 61  52  Temp: 98.2 F (36.8 C) 97.9 F (36.6 C)    TempSrc: Oral Oral    Resp: 18 18  16   SpO2: 92% 88% 93% 98%     Constitutional: Vital signs reviewed. Patient is a well-developed and well-nourished in no acute distress and cooperative with exam. Alert and oriented x3.  Head: Normocephalic and atraumatic  Ear: TM normal bilaterally  Mouth: no erythema or exudates, MMM  Eyes: PERRL, EOMI, conjunctivae normal, No scleral icterus.  Neck: Supple, Trachea midline normal ROM, No JVD, mass, thyromegaly, or carotid bruit present.  Cardiovascular: RRR, S1 normal, S2 normal, no MRG, pulses symmetric and intact bilaterally  Pulmonary/Chest: CTAB, no wheezes, rales, or rhonchi  Abdominal: Soft. Non-tender, non-distended, bowel sounds are normal, no masses, organomegaly, or guarding present.  GU: no CVA tenderness Musculoskeletal: Normal range of motion. He exhibits no edema or tenderness.  Left le w/o shortening or  rotation Pain with range or motion Ext: no edema and no cyanosis, pulses palpable bilaterally (DP and PT)  Hematology: no cervical, inginal, or  axillary adenopathy.  Neurological: A&O x3, Strenght is normal and symmetric bilaterally, cranial nerve II-XII are grossly intact, no focal motor deficit, sensory intact to light touch bilaterally.  Skin: Warm, dry and intact. No rash, cyanosis, or clubbing.  Psychiatric: Normal mood and affect. speech and behavior is normal. Judgment and thought content normal. Cognition and memory are normal.      Data Review   Micro Results No results found for this or any previous visit (from the past 240 hour(s)).  Radiology Reports Dg Chest 1 View  07/07/2015  CLINICAL DATA:  Preoperative for left femur fracture repair. EXAM: CHEST 1 VIEW COMPARISON:  05/24/2012 chest radiograph. FINDINGS: Partially visualized is surgical plate with interlocking screws overlying the lower cervical spine. Stable cardiomediastinal silhouette with normal heart size. No pneumothorax. No pleural effusion. Stable biapical pleural-parenchymal scarring, asymmetric to the right. There is faint focal round opacity in the right midlung. IMPRESSION: Faint focal round opacity in the right midlung, cannot exclude a pulmonary nodule. Correlation with chest CT is advised. Electronically Signed   By: Ilona Sorrel M.D.   On: 07/07/2015 08:26   Dg Wrist Complete Right  07/07/2015  CLINICAL DATA:  Fall with right wrist pain EXAM: RIGHT WRIST - COMPLETE 3+ VIEW COMPARISON:  None. FINDINGS: There is prominent soft tissue swelling in the radial distal right forearm. No fracture, dislocation or suspicious focal osseous lesion. Severe osteoarthritis in the scaphoid- trapezium, first carpometacarpal and first metacarpophalangeal joints. Vascular calcifications throughout the soft tissues. IMPRESSION: Prominent soft tissue swelling in the radial distal right forearm. No fracture or dislocation in the right  wrist. Polyarticular osteoarthritis as described. Electronically Signed   By: Ilona Sorrel M.D.   On: 07/07/2015 08:22   Dg Hip Unilat With Pelvis 2-3 Views Left  07/07/2015  CLINICAL DATA:  Fall with left hip pain.  Initial encounter. EXAM: DG HIP (WITH OR WITHOUT PELVIS) 2-3V LEFT COMPARISON:  None. FINDINGS: Subcapital left femoral neck fracture with lateral displacement and mild posterior impaction with anterior fracture widening. The left femoral head is located. No notable degenerative changes to the acetabulum. The right hip is intact and located. No evidence of pelvic ring fracture or diastasis. Osteopenia and extensive atherosclerosis. IMPRESSION: Subcapital left femoral neck fracture. Electronically Signed   By: Monte Fantasia M.D.   On: 07/07/2015 07:47     CBC  Recent Labs Lab 07/07/15 0741  WBC 7.5  HGB 11.0*  HCT 33.6*  PLT 144*  MCV 89.6  MCH 29.3  MCHC 32.7  RDW 14.1  LYMPHSABS 1.0  MONOABS 0.5  EOSABS 0.3  BASOSABS 0.0    Chemistries   Recent Labs Lab 07/07/15 0741  NA 140  K 4.6  CL 99*  CO2 27  GLUCOSE 168*  BUN 50*  CREATININE 2.14*  CALCIUM 8.8*   ------------------------------------------------------------------------------------------------------------------ CrCl cannot be calculated (Unknown ideal weight.). ------------------------------------------------------------------------------------------------------------------ No results for input(s): HGBA1C in the last 72 hours. ------------------------------------------------------------------------------------------------------------------ No results for input(s): CHOL, HDL, LDLCALC, TRIG, CHOLHDL, LDLDIRECT in the last 72 hours. ------------------------------------------------------------------------------------------------------------------ No results for input(s): TSH, T4TOTAL, T3FREE, THYROIDAB in the last 72 hours.  Invalid input(s):  FREET3 ------------------------------------------------------------------------------------------------------------------ No results for input(s): VITAMINB12, FOLATE, FERRITIN, TIBC, IRON, RETICCTPCT in the last 72 hours.  Coagulation profile No results for input(s): INR, PROTIME in the last 168 hours.  No results for input(s): DDIMER in the last 72 hours.  Cardiac Enzymes No results for input(s): CKMB, TROPONINI, MYOGLOBIN in the last 168 hours.  Invalid input(s): CK ------------------------------------------------------------------------------------------------------------------ Invalid  input(s): POCBNP   CBG: No results for input(s): GLUCAP in the last 168 hours.     EKG: Independently reviewed. *   Assessment/Plan Active Problems:   Bursitis of right hip   CHF (congestive heart failure) (HCC)   PVD (peripheral vascular disease) (HCC)   CAD (coronary artery disease)   CKD (chronic kidney disease) stage 3, GFR 30-59 ml/min   Diabetes mellitus type II, controlled ([place])   HTN (hypertension)   Anemia   Diabetic polyneuropathy (HCC)   Hip fracture (HCC)   1. subcapital left femoral neck fracture-hip and patient to telemetry, orthopedics has been notified, obtain preop 2-D echo given history of congestive heart failure,dilauidid and percocet for pain control, check UA , tx to [facility] per Dr Berenice Primas  Pending cardiology clearance  ,discussesd with family revised cardiac risk index is   moderate given prior extensive  cardiovascular hx ,      2. CHF (congestive heart failure),   chronic: check ProBNP   CXR no pulmonary edema. Hold   lasix given dehydration,start IVF for one day    3. PVD (peripheral vascular disease): Patient is sl/p fem-pop bypass in the past. He does not have symptoms of claudication at this timer, and LE appear well perfused. Followed by Dr Donnetta Hutching.   4. CAD (coronary artery disease): Stable/asymptomatic. cycle enzymes  5. CKD (chronic kidney disease), stage  III: baseline around 1.4 to 1.5mg /dl. Now 2.14 Patient is currently under care of Dr Jamal Maes, nephrologist. hold lasix and cozaar  5. Diabetes mellitus type II, controlled: last A1c 5.4. start ssi  6. HTN (hypertension): low normal BP.cont coreg  7. Neuropathy/foot pain:previously on low dose gabapentin   8. Urinary retention, chronic problem secondary to BPH: place  foley  due to urinary retention in the emergency department. He has previously needed home foley, cont doxazosin to 8mg .   9.  Constipation: Added miralax, colace, senna and milk of magnesia prn  10. Anemia of chronic disease, hemoglobin stable baseline  10. Defer work up to PCP if not already complete.  11. Thrombocytopenia, mild. No signs of bleeding ,follow    Code Status:   full Family Communication: bedside Disposition Plan: admit   Total time spent 55 minutes.Greater than 50% of this time was spent in counseling, explanation of diagnosis, planning of further management, and coordination of care  [facility] Pager [phone]  If 7PM-7AM, please contact night-coverage [email or web address] Password [email or web address] 07/07/2015, 10:07 AM

## 2015-07-07 NOTE — ED Notes (Signed)
Ativan was held d/t pt was less anxious after eating. Ativan was already taken and wasted out of pyxis and I received the ativan for PRN from [facility]

## 2015-07-07 NOTE — ED Provider Notes (Signed)
8:48 PM Care link requests cervical spine clearance. Patient has acute on chronic neck pain. He cannot return if he hit his neck. He does have some mild left-sided neck tenderness. At this point his room is ready at [facility], I have low suspicion that he actually broke his neck but cannot fully clear him given his femur fracture with severe pain as well as possible injury to his neck. We'll place in a collar, when he arrives at [facility] he will need neck imaging to clear his neck.  Sherwood Gambler, MD 07/07/15 2051

## 2015-07-07 NOTE — ED Provider Notes (Signed)
CSN: [id number]     Arrival date & time 07/07/15  [id number] History   First MD Initiated Contact with Patient 07/07/15 [phone]     Chief Complaint  Patient presents with  . Fall  . Hip Pain     (Consider location/radiation/quality/duration/timing/severity/associated sxs/prior Treatment) HPI Comments: Pt here after mechanical fall while getting out of bed Denies any recent illness or new meds, takes asa Fell onto left hip, no loc Uses a walker normally, now unable to bear weight  Pain is sharp, located at left hip and worse with movement and better w/ remaning still No tx used pta and ems called and pt transported here  Patient is a 82 y.o. male presenting with fall and hip pain. The history is provided by the patient and the spouse.  Fall This is a new problem. The current episode started less than 1 hour ago. The problem has not changed since onset.Pertinent negatives include no chest pain and no abdominal pain. Nothing relieves the symptoms.  Hip Pain Pertinent negatives include no chest pain and no abdominal pain.    Past Medical History  Diagnosis Date  . Kidney disease   . Diabetes mellitus   . Hypertension   . MI (myocardial infarction) ([place])     1974  . Peripheral vascular disease ([place])     veinsi rerouted in both legs   . Pneumonia     hx of   . Anemia   . Blood transfusion     hx of 6 months ago   . GERD (gastroesophageal reflux disease)   . Arthritis   . Bowel obstruction ([place])   . PONV (postoperative nausea and vomiting)   . Gait disorder   . Depression   . Dyslipidemia   . Heart disease (organic)   . C. difficile colitis   . History of GI bleed     Upper  . Lumbosacral spinal stenosis   . Memory deficit 11/29/2012  . Degenerative arthritis   . Peptic ulcer disease   . Wears glasses   . HOH (hard of hearing)    Past Surgical History  Procedure Laterality Date  . Back surgery      back surgery x 2   . Eye surgery      bilateral cataract surgery with  implants   . Other surgical history      2 fatty tumors removed - on different arms   . Other surgical history      prostate surgery x 3   . Other surgical history      veins rerouted in both legs   . Other surgical history      hemorrhoid surgery  . Other surgical history      carpal tunnel right   . Other surgical history      big toe nails removed   . Other surgical history      repair of blood vessel in esophagus and stomach- 2006  . Other surgical history      bilateral kidney stents  2007  . Other surgical history      small bowel obstruction 2011   . Excision/release bursa hip  08/13/2011    Procedure: EXCISION/RELEASE BURSA HIP;  Surgeon: Tobi Bastos, MD;  Location: [facility];  Service: Orthopedics;  Laterality: Right;  Right Hip Excision Tumor  . Esophageal manometry  11/08/2011    Procedure: ESOPHAGEAL MANOMETRY (EM);  Surgeon: Beryle Beams, MD;  Location: [facility];  Service: Endoscopy;  Laterality:  N/A;  . Hemorrhoid surgery    . Cataract extraction Bilateral   . Lipoma resection    . Transurethral resection of prostate      For benign prostate hypertrophy  . Carpal tunnel release Bilateral   . Ureteral stent placement Bilateral   . Cervical laminectomy    . Skin cancer resection      Left forehead  . Right hip surgery    . Femoral-popliteal bypass graft Left 07-08-1997    Curt Jews MD  . Femoral-popliteal bypass graft Right 06-04-1997    Curt Jews MD  . Carpal tunnel release Left 11/20/2013    Procedure: LEFT  CARPAL TUNNEL RELEASE;  Surgeon: Wynonia Sours, MD;  Location: [facility];  Service: Orthopedics;  Laterality: Left;  . Hypothenar fat pad transfer Left 11/20/2013    Procedure: HYPOTHENAR FAT PAD TRANSFER LEFT;  Surgeon: Wynonia Sours, MD;  Location: [facility];  Service: Orthopedics;  Laterality: Left;   Family History  Problem Relation Age of Onset  . Cancer Sister    Social History  Substance Use Topics  . Smoking  status: Former Smoker    Quit date: 07/22/1972  . Smokeless tobacco: Never Used  . Alcohol Use: No    Review of Systems  Cardiovascular: Negative for chest pain.  Gastrointestinal: Negative for abdominal pain.  All other systems reviewed and are negative.     Allergies  Hctz; Tetracyclines & related; Amoxicillin; Apresoline esidrix; Biaxin; Clarithromycin; Hydralazine; Levaquin; Other; and Serzone  Home Medications   Prior to Admission medications   Medication Sig Start Date End Date Taking? Authorizing Provider  aspirin EC 81 MG tablet Take 81 mg by mouth daily before breakfast.     Historical Provider, MD  atorvastatin (LIPITOR) 20 MG tablet Take 20 mg by mouth daily.    Historical Provider, MD  carvedilol (COREG) 25 MG tablet Take 25 mg by mouth 2 (two) times daily with a meal.    Historical Provider, MD  doxazosin (CARDURA) 4 MG tablet Take 4 mg by mouth 2 (two) times daily.     Historical Provider, MD  ferrous sulfate 325 (65 FE) MG tablet Take 325 mg by mouth daily with breakfast.    Historical Provider, MD  finasteride (PROSCAR) 5 MG tablet Take 5 mg by mouth daily. 11/20/12   Historical Provider, MD  furosemide (LASIX) 40 MG tablet Take 40 mg by mouth 3 (three) times daily.     Historical Provider, MD  lidocaine (XYLOCAINE) 5 % ointment Apply 1 application topically as needed. 06/27/15   Kathrynn Ducking, MD  losartan (COZAAR) 100 MG tablet Take 50 mg by mouth daily.     Historical Provider, MD  mirtazapine (REMERON) 15 MG tablet Take 15 mg by mouth at bedtime.    Historical Provider, MD  Multiple Vitamin (MULITIVITAMIN WITH MINERALS) TABS Take 1 tablet by mouth daily before breakfast.     Historical Provider, MD  oxyCODONE-acetaminophen (PERCOCET) 7.5-325 MG tablet Take 1 tablet by mouth every 6 (six) hours as needed. 06/27/15   Kathrynn Ducking, MD  polyethylene glycol [facility] Floria Raveling) packet Take 17 g by mouth daily.    Historical Provider, MD  senna (SENOKOT) 8.6 MG tablet  Take 1 tablet by mouth 4 (four) times daily.    Historical Provider, MD  sertraline (ZOLOFT) 25 MG tablet Take 25 mg by mouth daily. 11/21/12   Historical Provider, MD  [place] Name: Magnlife DB-foot cream  Historical Provider, MD  vitamin B-12 (CYANOCOBALAMIN) 1000 MCG tablet Take 1,000 mcg by mouth daily before breakfast.     Historical Provider, MD  vitamin C (ASCORBIC ACID) 500 MG tablet Take 500 mg by mouth daily before breakfast.     Historical Provider, MD   BP 169/64 mmHg  Pulse 63  Temp(Src) 98.2 F (36.8 C) (Oral)  Resp 18  SpO2 92% Physical Exam  Constitutional: He is oriented to person, place, and time. He appears well-developed and well-nourished.  Non-toxic appearance. No distress.  HENT:  Head: Normocephalic and atraumatic.  Eyes: Conjunctivae, EOM and lids are normal. Pupils are equal, round, and reactive to light.  Neck: Normal range of motion. Neck supple. No spinous process tenderness and no muscular tenderness present. No tracheal deviation present. No thyroid mass present.  Cardiovascular: Normal rate, regular rhythm and normal heart sounds.  Exam reveals no gallop.   No murmur heard. Pulmonary/Chest: Effort normal and breath sounds normal. No stridor. No respiratory distress. He has no decreased breath sounds. He has no wheezes. He has no rhonchi. He has no rales.  Abdominal: Soft. Normal appearance and bowel sounds are normal. He exhibits no distension. There is no tenderness. There is no rebound and no CVA tenderness.  Musculoskeletal: Normal range of motion. He exhibits no edema or tenderness.  Left le w/o shortening or rotation Pain with range or motion NVI  Neurological: He is alert and oriented to person, place, and time. He has normal strength. No cranial nerve deficit or sensory deficit. GCS eye subscore is 4. GCS verbal subscore is 5. GCS motor subscore is 6.  Skin: Skin is warm and dry. No abrasion and no rash noted.  Psychiatric: He has a normal  mood and affect. His speech is normal and behavior is normal.  Nursing note and vitals reviewed.   ED Course  Procedures (including critical care time) Labs Review Labs Reviewed  CBC WITH DIFFERENTIAL/PLATELET  BASIC METABOLIC PANEL    Imaging Review No results found. I have personally reviewed and evaluated these images and lab results as part of my medical decision-making.   EKG Interpretation None      MDM   Final diagnoses:  None    Patient given morphine for pain along with Zofran for nausea. Spoke with orthopedics, Dr. Earl Lites service, will see patient in consultation and patient will be admitted to the medical floor    Lacretia Leigh, MD 07/07/15 [phone]

## 2015-07-07 NOTE — ED Notes (Signed)
[place] in to see patient, states he didn't see anything wrong with him, placed in reverse trendelenburg to help relieve some SHOB. Will reassess shortly.

## 2015-07-07 NOTE — ED Notes (Signed)
Pt says wife is on her way and has the copy of living will/

## 2015-07-07 NOTE — ED Notes (Signed)
Pt states he's feeling SOB, appears anxious, vitals stable, lung sounds clear bilaterally. Alerted ER MD Regenia Skeeter who stated he would come see the patient momentarily.

## 2015-07-07 NOTE — Progress Notes (Signed)
CSW met with patient at bedside. Wife was present during this encounter [name]). She reports patient resides at home with her and they live in [place], [place]. She reports patient fell around 5:15a when he was going to use the bathroom. She reports she heard him holler "oh no" and she knew something was wrong. She reports she then found him lying on the floor. She reports patient had fallen on his left hip.   She reports patient uses a walker everywhere he goes and he had the walker when going to the bathroom this morning. She reports she is his main support system. No questions noted for CSW at this time.  Bron Snellings [phone] [cell] [phone] [home]  Genice Rouge [phone] ED CSW 07/07/2015 9:40 AM

## 2015-07-07 NOTE — ED Notes (Signed)
Patient's wife stated he couldn't swallow pills well. Patient also unable to sit upright due to pain/fracture in left leg. Sent message to Dr. Allyson Sabal about holding pill-form medications until NPO is released in order to crush and give with applesauce. Changed pain medication to liquid via pharmacy. Will hold pill medications until I have a safe route to give them by or until otherwise told by doctor.

## 2015-07-07 NOTE — ED Notes (Signed)
Sent page to attending asking whether or not lovenox was needed prior to surgery. Awaiting response prior to giving blood thinner.

## 2015-07-07 NOTE — ED Notes (Signed)
Pt comes to ed via EMS, c/o fall/ left hip pain, no LOC, no deformity found. Pt has a ping ball ball sized hematoma on right forearm from fall. Fall was witnessed. bp 168/90, pulse 64, CBg 188, Sp02 94,  On arrival, alert. Pt has a hearing deficit.

## 2015-07-08 ENCOUNTER — Inpatient Hospital Stay (HOSPITAL_COMMUNITY): Payer: Medicare Other

## 2015-07-08 ENCOUNTER — Other Ambulatory Visit (HOSPITAL_COMMUNITY): Payer: Self-pay

## 2015-07-08 ENCOUNTER — Inpatient Hospital Stay (HOSPITAL_COMMUNITY): Payer: Medicare Other | Admitting: Certified Registered Nurse Anesthetist

## 2015-07-08 ENCOUNTER — Encounter (HOSPITAL_COMMUNITY)
Admission: EM | Disposition: A | Discharge status: Skilled Nursing Facility | Payer: Self-pay | Source: Home / Self Care | Attending: Internal Medicine

## 2015-07-08 ENCOUNTER — Inpatient Hospital Stay (HOSPITAL_COMMUNITY): Admission: RE | Admit: 2015-07-08 | Payer: Medicare Other | Source: Ambulatory Visit | Admitting: Orthopaedic Surgery

## 2015-07-08 ENCOUNTER — Other Ambulatory Visit: Payer: Self-pay | Admitting: Orthopaedic Surgery

## 2015-07-08 ENCOUNTER — Encounter (HOSPITAL_COMMUNITY): Payer: Self-pay | Admitting: Cardiology

## 2015-07-08 DIAGNOSIS — I2510 Atherosclerotic heart disease of native coronary artery without angina pectoris: Secondary | ICD-10-CM

## 2015-07-08 HISTORY — PX: HIP ARTHROPLASTY: SHX981

## 2015-07-08 LAB — GLUCOSE, CAPILLARY
GLUCOSE-CAPILLARY: 140 mg/dL — AB (ref 65–99)
GLUCOSE-CAPILLARY: 142 mg/dL — AB (ref 65–99)
GLUCOSE-CAPILLARY: 161 mg/dL — AB (ref 65–99)
Glucose-Capillary: 145 mg/dL — ABNORMAL HIGH (ref 65–99)
Glucose-Capillary: 152 mg/dL — ABNORMAL HIGH (ref 65–99)
Glucose-Capillary: 169 mg/dL — ABNORMAL HIGH (ref 65–99)

## 2015-07-08 LAB — HEMOGLOBIN A1C
Hgb A1c MFr Bld: 6.4 % — ABNORMAL HIGH (ref 4.8–5.6)
MEAN PLASMA GLUCOSE: 137 mg/dL

## 2015-07-08 LAB — SURGICAL PCR SCREEN
MRSA, PCR: POSITIVE — AB
Staphylococcus aureus: POSITIVE — AB

## 2015-07-08 SURGERY — HEMIARTHROPLASTY, HIP, DIRECT ANTERIOR APPROACH, FOR FRACTURE
Anesthesia: General | Site: Hip | Laterality: Left

## 2015-07-08 MED ORDER — PHENYLEPHRINE HCL 10 MG/ML IJ SOLN
INTRAMUSCULAR | Status: DC | PRN
  Administered 2015-07-08 (×4): 40 ug via INTRAVENOUS

## 2015-07-08 MED ORDER — METOCLOPRAMIDE HCL 5 MG/ML IJ SOLN: 5.0000 mg | Freq: Three times a day (TID) | INTRAMUSCULAR | Status: DC | PRN

## 2015-07-08 MED ORDER — SODIUM CHLORIDE 0.9 % IV SOLN
INTRAVENOUS | Status: DC
  Administered 2015-07-08: 09:00:00 via INTRAVENOUS

## 2015-07-08 MED ORDER — ACETAMINOPHEN 325 MG PO TABS: 325.0000 mg | ORAL_TABLET | ORAL | Status: DC | PRN

## 2015-07-08 MED ORDER — ACETAMINOPHEN 650 MG RE SUPP: 650.0000 mg | Freq: Four times a day (QID) | RECTAL | Status: DC | PRN

## 2015-07-08 MED ORDER — OXYCODONE HCL 5 MG PO TABS: 5.0000 mg | ORAL_TABLET | Freq: Once | ORAL | Status: DC | PRN

## 2015-07-08 MED ORDER — FENTANYL CITRATE (PF) 100 MCG/2ML IJ SOLN: 25.0000 ug | INTRAMUSCULAR | Status: DC | PRN

## 2015-07-08 MED ORDER — MAGNESIUM HYDROXIDE 400 MG/5ML PO SUSP
7.5000 mL | Freq: Every day | ORAL | Status: DC
  Administered 2015-07-08: 7.5 mL via ORAL
  Filled 2015-07-08: qty 30

## 2015-07-08 MED ORDER — SENNA 8.6 MG PO TABS
1.0000 | ORAL_TABLET | Freq: Four times a day (QID) | ORAL | Status: DC
  Administered 2015-07-08 – 2015-07-11 (×12): 8.6 mg via ORAL
  Filled 2015-07-08 (×12): qty 1

## 2015-07-08 MED ORDER — SODIUM CHLORIDE 0.9 % IR SOLN
Status: DC | PRN
  Administered 2015-07-08: 1000 mL

## 2015-07-08 MED ORDER — ONDANSETRON HCL 4 MG PO TABS: 4.0000 mg | ORAL_TABLET | Freq: Four times a day (QID) | ORAL | Status: DC | PRN

## 2015-07-08 MED ORDER — OXYCODONE HCL 5 MG/5ML PO SOLN: 5.0000 mg | Freq: Once | ORAL | Status: DC | PRN

## 2015-07-08 MED ORDER — ROCURONIUM BROMIDE 100 MG/10ML IV SOLN
INTRAVENOUS | Status: DC | PRN
  Administered 2015-07-08: 50 mg via INTRAVENOUS

## 2015-07-08 MED ORDER — VANCOMYCIN HCL 500 MG IV SOLR
500.0000 mg | Freq: Two times a day (BID) | INTRAVENOUS | Status: AC
  Administered 2015-07-08: 500 mg via INTRAVENOUS
  Filled 2015-07-08: qty 500

## 2015-07-08 MED ORDER — PROPOFOL 10 MG/ML IV BOLUS
INTRAVENOUS | Status: DC | PRN
  Administered 2015-07-08: 50 mg via INTRAVENOUS

## 2015-07-08 MED ORDER — HYDROMORPHONE HCL 1 MG/ML IJ SOLN
0.5000 mg | INTRAMUSCULAR | Status: DC | PRN
  Administered 2015-07-08 – 2015-07-09 (×2): 0.5 mg via INTRAVENOUS
  Filled 2015-07-08 (×2): qty 1

## 2015-07-08 MED ORDER — OXYCODONE HCL 5 MG PO TABS: 2.5000 mg | ORAL_TABLET | Freq: Four times a day (QID) | ORAL | Status: DC | PRN

## 2015-07-08 MED ORDER — EPHEDRINE SULFATE 50 MG/ML IJ SOLN
INTRAMUSCULAR | Status: DC | PRN
  Administered 2015-07-08 (×2): 10 mg via INTRAVENOUS

## 2015-07-08 MED ORDER — ACETAMINOPHEN 160 MG/5ML PO SOLN
325.0000 mg | ORAL | Status: DC | PRN
  Filled 2015-07-08: qty 20.3

## 2015-07-08 MED ORDER — ONDANSETRON HCL 4 MG/2ML IJ SOLN: 4.0000 mg | Freq: Four times a day (QID) | INTRAMUSCULAR | Status: DC | PRN

## 2015-07-08 MED ORDER — PHENOL 1.4 % MT LIQD: 1.0000 | OROMUCOSAL | Status: DC | PRN

## 2015-07-08 MED ORDER — ONDANSETRON HCL 4 MG/2ML IJ SOLN
INTRAMUSCULAR | Status: DC | PRN
  Administered 2015-07-08: 4 mg via INTRAVENOUS

## 2015-07-08 MED ORDER — OXYCODONE-ACETAMINOPHEN 5-325 MG PO TABS
1.0000 | ORAL_TABLET | Freq: Four times a day (QID) | ORAL | Status: DC | PRN
  Administered 2015-07-08 – 2015-07-11 (×2): 1 via ORAL
  Filled 2015-07-08 (×3): qty 1

## 2015-07-08 MED ORDER — MENTHOL 3 MG MT LOZG: 1.0000 | LOZENGE | OROMUCOSAL | Status: DC | PRN

## 2015-07-08 MED ORDER — FENTANYL CITRATE (PF) 100 MCG/2ML IJ SOLN
INTRAMUSCULAR | Status: DC | PRN
  Administered 2015-07-08: 50 ug via INTRAVENOUS

## 2015-07-08 MED ORDER — CLINDAMYCIN PHOSPHATE 600 MG/50ML IV SOLN
600.0000 mg | INTRAVENOUS | Status: AC
  Administered 2015-07-08: 600 mg via INTRAVENOUS
  Filled 2015-07-08: qty 50

## 2015-07-08 MED ORDER — PHENYLEPHRINE HCL 10 MG/ML IJ SOLN
10.0000 mg | INTRAVENOUS | Status: DC | PRN
  Administered 2015-07-08: 25 ug/min via INTRAVENOUS

## 2015-07-08 MED ORDER — METOCLOPRAMIDE HCL 5 MG PO TABS: 5.0000 mg | ORAL_TABLET | Freq: Three times a day (TID) | ORAL | Status: DC | PRN

## 2015-07-08 MED ORDER — ACETAMINOPHEN 325 MG PO TABS
650.0000 mg | ORAL_TABLET | Freq: Four times a day (QID) | ORAL | Status: DC | PRN
  Administered 2015-07-09: 650 mg via ORAL
  Filled 2015-07-08: qty 2

## 2015-07-08 MED ORDER — SUGAMMADEX SODIUM 200 MG/2ML IV SOLN
INTRAVENOUS | Status: DC | PRN
  Administered 2015-07-08: 162 mg via INTRAVENOUS

## 2015-07-08 MED ORDER — LIDOCAINE HCL (CARDIAC) 20 MG/ML IV SOLN
INTRAVENOUS | Status: DC | PRN
  Administered 2015-07-08: 80 mg via INTRATRACHEAL

## 2015-07-08 MED ORDER — ASPIRIN EC 325 MG PO TBEC
325.0000 mg | DELAYED_RELEASE_TABLET | Freq: Two times a day (BID) | ORAL | Status: DC
  Administered 2015-07-09 – 2015-07-11 (×5): 325 mg via ORAL
  Filled 2015-07-08 (×5): qty 1

## 2015-07-08 MED ORDER — SODIUM CHLORIDE 0.9 % IV SOLN
INTRAVENOUS | Status: DC | PRN
  Administered 2015-07-08: 10:00:00 via INTRAVENOUS

## 2015-07-08 MED ORDER — VANCOMYCIN HCL 500 MG IV SOLR
500.0000 mg | INTRAVENOUS | Status: AC
  Administered 2015-07-08: 500 mg via INTRAVENOUS
  Filled 2015-07-08: qty 500

## 2015-07-08 SURGICAL SUPPLY — 60 items
BLADE SAW SAG 73X25 THK (BLADE) ×2
BLADE SAW SGTL 73X25 THK (BLADE) ×1 IMPLANT
BLADE SURG ROTATE 9660 (MISCELLANEOUS) IMPLANT
BRUSH FEMORAL CANAL (MISCELLANEOUS) IMPLANT
CAPT HIP HEMI 1 ×3 IMPLANT
COVER SURGICAL LIGHT HANDLE (MISCELLANEOUS) ×3 IMPLANT
DRAPE IMP U-DRAPE 54X76 (DRAPES) ×3 IMPLANT
DRAPE ORTHO SPLIT 77X108 STRL (DRAPES) ×4
DRAPE PROXIMA HALF (DRAPES) ×3 IMPLANT
DRAPE SURG ORHT 6 SPLT 77X108 (DRAPES) ×2 IMPLANT
DRAPE U-SHAPE 47X51 STRL (DRAPES) ×3 IMPLANT
DRILL BIT 7/64X5 (BIT) ×3 IMPLANT
DRSG ADAPTIC 3X8 NADH LF (GAUZE/BANDAGES/DRESSINGS) ×3 IMPLANT
DRSG PAD ABDOMINAL 8X10 ST (GAUZE/BANDAGES/DRESSINGS) ×3 IMPLANT
DURAPREP 26ML APPLICATOR (WOUND CARE) ×3 IMPLANT
ELECT BLADE 6.5 EXT (BLADE) IMPLANT
ELECT REM PT RETURN 9FT ADLT (ELECTROSURGICAL) ×3
ELECTRODE REM PT RTRN 9FT ADLT (ELECTROSURGICAL) ×1 IMPLANT
EVACUATOR 1/8 PVC DRAIN (DRAIN) IMPLANT
GAUZE SPONGE 4X4 12PLY STRL (GAUZE/BANDAGES/DRESSINGS) ×3 IMPLANT
GLOVE BIO SURGEON STRL SZ8 (GLOVE) ×12 IMPLANT
GLOVE BIOGEL PI IND STRL 8 (GLOVE) ×2 IMPLANT
GLOVE BIOGEL PI INDICATOR 8 (GLOVE) ×4
GLOVE BIOGEL PI ORTHO PRO 7.5 (GLOVE) ×4
GLOVE PI ORTHO PRO STRL 7.5 (GLOVE) ×2 IMPLANT
GOWN STRL REUS W/ TWL LRG LVL3 (GOWN DISPOSABLE) ×1 IMPLANT
GOWN STRL REUS W/ TWL XL LVL3 (GOWN DISPOSABLE) ×3 IMPLANT
GOWN STRL REUS W/TWL LRG LVL3 (GOWN DISPOSABLE) ×2
GOWN STRL REUS W/TWL XL LVL3 (GOWN DISPOSABLE) ×6
HANDPIECE INTERPULSE COAX TIP (DISPOSABLE)
IMMOBILIZER KNEE 20 (SOFTGOODS) IMPLANT
IMMOBILIZER KNEE 22 UNIV (SOFTGOODS) ×3 IMPLANT
IMMOBILIZER KNEE 24 THIGH 36 (MISCELLANEOUS) IMPLANT
IMMOBILIZER KNEE 24 UNIV (MISCELLANEOUS)
KIT BASIN OR (CUSTOM PROCEDURE TRAY) ×3 IMPLANT
KIT ROOM TURNOVER OR (KITS) ×3 IMPLANT
MANIFOLD NEPTUNE II (INSTRUMENTS) ×3 IMPLANT
NEEDLE 1/2 CIR MAYO (NEEDLE) IMPLANT
NS IRRIG 1000ML POUR BTL (IV SOLUTION) ×3 IMPLANT
PACK TOTAL JOINT (CUSTOM PROCEDURE TRAY) ×3 IMPLANT
PACK UNIVERSAL I (CUSTOM PROCEDURE TRAY) ×3 IMPLANT
PAD ARMBOARD 7.5X6 YLW CONV (MISCELLANEOUS) ×6 IMPLANT
PASSER SUT SWANSON 36MM LOOP (INSTRUMENTS) IMPLANT
SET HNDPC FAN SPRY TIP SCT (DISPOSABLE) IMPLANT
SPONGE GAUZE 4X4 12PLY STER LF (GAUZE/BANDAGES/DRESSINGS) ×3 IMPLANT
STAPLER VISISTAT 35W (STAPLE) ×3 IMPLANT
SUCTION FRAZIER TIP 10 FR DISP (SUCTIONS) ×3 IMPLANT
SUT ETHIBOND NAB CT1 #1 30IN (SUTURE) ×3 IMPLANT
SUT VIC AB 0 CT1 27 (SUTURE) ×2
SUT VIC AB 0 CT1 27XBRD ANBCTR (SUTURE) ×1 IMPLANT
SUT VIC AB 1 CT1 27 (SUTURE) ×3
SUT VIC AB 1 CT1 27XBRD ANTBC (SUTURE) ×1 IMPLANT
SUT VIC AB 1 CTB1 27 (SUTURE) ×9 IMPLANT
SUT VIC AB 2-0 CT1 27 (SUTURE) ×4
SUT VIC AB 2-0 CT1 TAPERPNT 27 (SUTURE) ×2 IMPLANT
TOWEL OR 17X24 6PK STRL BLUE (TOWEL DISPOSABLE) ×3 IMPLANT
TOWEL OR 17X26 10 PK STRL BLUE (TOWEL DISPOSABLE) ×3 IMPLANT
TOWER CARTRIDGE SMART MIX (DISPOSABLE) IMPLANT
TRAY FOLEY CATH 16FRSI W/METER (SET/KITS/TRAYS/PACK) IMPLANT
WATER STERILE IRR 1000ML POUR (IV SOLUTION) ×12 IMPLANT

## 2015-07-08 NOTE — Anesthesia Preprocedure Evaluation (Signed)
Anesthesia Evaluation  Patient identified by MRN, date of birth, ID band Patient awake    Reviewed: Allergy & Precautions, NPO status , Patient's Chart, lab work & pertinent test results  History of Anesthesia Complications (+) PONV and history of anesthetic complications  Airway Mallampati: II  TM Distance: >3 FB Neck ROM: Full    Dental  (+) Edentulous Upper, Edentulous Lower   Pulmonary neg shortness of breath, neg sleep apnea, neg COPD, neg recent URI, former smoker, neg PE   breath sounds clear to auscultation       Cardiovascular hypertension, + CAD, + Past MI, + Peripheral Vascular Disease and +CHF   Rhythm:Regular     Neuro/Psych PSYCHIATRIC DISORDERS Depression  Neuromuscular disease    GI/Hepatic Neg liver ROS, PUD, GERD  Medicated and Controlled,  Endo/Other  diabetes  Renal/GU Renal InsufficiencyRenal disease     Musculoskeletal  (+) Arthritis ,   Abdominal   Peds  Hematology   Anesthesia Other Findings   Reproductive/Obstetrics                             Anesthesia Physical Anesthesia Plan  ASA: III  Anesthesia Plan: General   Post-op Pain Management:    Induction: Intravenous  Airway Management Planned: Oral ETT  Additional Equipment: None  Intra-op Plan:   Post-operative Plan: Extubation in OR  Informed Consent: I have reviewed the patients History and Physical, chart, labs and discussed the procedure including the risks, benefits and alternatives for the proposed anesthesia with the patient or authorized representative who has indicated his/her understanding and acceptance.   Dental advisory given  Plan Discussed with: CRNA and Surgeon  Anesthesia Plan Comments:         Anesthesia Quick Evaluation

## 2015-07-08 NOTE — Transfer of Care (Signed)
Immediate Anesthesia Transfer of Care Note  Patient: [name]  Procedure(s) Performed: Procedure(s): LEFT  HIP HEMIARTHROPLASTY (Left)  Patient Location: PACU  Anesthesia Type:General  Level of Consciousness: awake, alert , patient cooperative and responds to stimulation  Airway & Oxygen Therapy: Patient Spontanous Breathing and Patient connected to nasal cannula oxygen  Post-op Assessment: Report given to RN, Post -op Vital signs reviewed and stable and Patient moving all extremities X 4  Post vital signs: Reviewed and stable  Last Vitals:  Filed Vitals:   07/08/15 0602 07/08/15 0820  BP: 144/51 127/47  Pulse: 63 60  Temp: 37.3 C 37.4 C  Resp: 18 18    Complications: No apparent anesthesia complications

## 2015-07-08 NOTE — Progress Notes (Signed)
PROGRESS NOTE    [name] [id number] DOB: 11/27/1920 DOA: 07/07/2015 PCP: Horatio Pel, MD  HPI/Brief narrative 82 year old male, ambulates with the help of a walker, with history of DM 2 with peripheral neuropathy, HTN, CAD, MI, chronic CHF (unknown type-no echo in system), PAD, dyslipidemia, GERD, RAS status post bilateral stents, stage III chronic kidney disease, hard of hearing, initially presented to [facility] ED on 07/07/15 following mechanical fall at home while trying to get out of bed. Imaging revealed subcapital left femoral neck fracture. Orthopedics was consulted and recommended transferring to [facility] for surgery on 1/17.  Assessment/Plan:   Left femoral neck fracture, status post hemiarthroplasty - Orthopedics was consulted and recommended transferring patient from [facility] to [facility] for surgery on 1/17. - Patient underwent left hip hemiarthroplasty on 1/17. Management per orthopedics.  Chronic CHF, unknown type - No echo in system. Follow 2-D echo that has been ordered. - Clinically compensated. Lasix currently on hold but may be resumed 1/18. Chest x-ray without pulmonary edema.  PAD - Status post femoropopliteal bypass in the past. No symptoms suggestive of claudication. Follows with Dr. Donnetta Hutching  CAD - Asymptomatic of chest pain  Stage III chronic kidney disease - Baseline creatinine said to be in the 1.4-1.5. Initially presented with creatinine of 2.14. Lasix and ARB temporarily held. Follow BMP in a.m. - Under the care of Dr. Lorrene Reid as outpatient.  Type II DM with peripheral neuropathy - Last A1c: 5.4. Continue SSI. Controlled in the hospital.  Essential hypertension - Controlled. Continue Coreg. They are be on hold.  Acute urinary retention/BPH - Foley catheter placed in ED due to urinary retention. He has apparently needed home Foley catheter in the past. Continue doxazosin.  Constipation - Bowel regimen.  Anemia of chronic disease/chronic kidney disease -  Stable. Follow CBC in a.m.  Right mid lung opacity on chest x-ray - Cannot exclude pulmonary nodule. Outpatient evaluation as & if deemed necessary.   DVT prophylaxis: Lovenox Code Status: Full Family Communication: Discussed with patient's spouse and grandson at bedside. Disposition Plan: DC to SNF when medically stable.   Consultants:  Orthopedics  Procedures:  Foley catheter  Left hip hemiarthroplasty 07/08/15  Antimicrobials:  None   Subjective: When I came to see this patient this morning, he was already in the OR. Patient was seen postoperatively on the floor. Denies complaints. Denies pain. No chest pain or dyspnea reported.  Objective: Filed Vitals:   07/08/15 1145 07/08/15 1200 07/08/15 1215 07/08/15 1246  BP: 123/52 113/83 111/47 132/49  Pulse: 45 53 46 53  Temp:   97.2 F (36.2 C) 98.4 F (36.9 C)  TempSrc:      Resp: 18 15 15 18   SpO2: 94% 94% 92% 93%    Intake/Output Summary (Last 24 hours) at 07/08/15 1647 Last data filed at 07/08/15 1215  Gross per 24 hour  Intake 1063.33 ml  Output    800 ml  Net 263.33 ml   There were no vitals filed for this visit.  Exam:  General exam: Pleasant elderly male lying comfortably propped up in bed. Respiratory system: Slightly diminished breath sounds in the bases/poor inspiratory effort but otherwise clear to auscultation. No increased work of breathing. Cardiovascular system: S1 & S2 heard, RRR. No JVD, murmurs, gallops, clicks or pedal edema. Gastrointestinal system: Abdomen is nondistended/obese, soft and nontender. Normal bowel sounds heard. Only catheter +. Central nervous system: Alert and oriented. No focal neurological deficits. Extremities: Symmetric 5 x 5 power. Left hip surgical site  dressing clean and dry. Ice pack on top. Left lower extremity in immobilizer.   Data Reviewed: Basic Metabolic Panel:  Recent Labs Lab 07/07/15 0741 07/07/15 1121  NA 140  --   K 4.6  --   CL 99*  --   CO2 27   --   GLUCOSE 168*  --   BUN 50*  --   CREATININE 2.14* 2.06*  CALCIUM 8.8*  --   MG  --  3.0*   Liver Function Tests:  Recent Labs Lab 07/07/15 1121  AST 26  ALT 18  ALKPHOS 72  BILITOT 0.8  PROT 7.4  ALBUMIN 4.1   No results for input(s): LIPASE, AMYLASE in the last 168 hours. No results for input(s): AMMONIA in the last 168 hours. CBC:  Recent Labs Lab 07/07/15 0741 07/07/15 1121  WBC 7.5 14.3*  NEUTROABS 5.7  --   HGB 11.0* 11.8*  HCT 33.6* 37.1*  MCV 89.6 92.1  PLT 144* 151   Cardiac Enzymes: No results for input(s): CKTOTAL, CKMB, CKMBINDEX, TROPONINI in the last 168 hours. BNP (last 3 results) No results for input(s): PROBNP in the last 8760 hours. CBG:  Recent Labs Lab 07/07/15 1642 07/08/15 0624 07/08/15 0809 07/08/15 1140 07/08/15 1252  GLUCAP 172* 140* 142* 145* 152*    Recent Results (from the past 240 hour(s))  Surgical pcr screen     Status: Abnormal   Collection Time: 07/08/15  4:20 AM  Result Value Ref Range Status   MRSA, PCR POSITIVE (A) NEGATIVE Final   Staphylococcus aureus POSITIVE (A) NEGATIVE Final    Comment:        The Xpert SA Assay (FDA approved for NASAL specimens in patients over 65 years of age), is one component of a comprehensive surveillance program.  Test performance has been validated by [facility] for patients greater than or equal to 38 year old. It is not intended to diagnose infection nor to guide or monitor treatment. RESULT CALLED TO, READ BACK BY AND VERIFIED WITH: @ 7:52 ON 001/17/2017 BY [email or web address]          Studies: Dg Chest 1 View  07/07/2015  CLINICAL DATA:  Preoperative for left femur fracture repair. EXAM: CHEST 1 VIEW COMPARISON:  05/24/2012 chest radiograph. FINDINGS: Partially visualized is surgical plate with interlocking screws overlying the lower cervical spine. Stable cardiomediastinal silhouette with normal heart size. No pneumothorax. No pleural effusion. Stable biapical  pleural-parenchymal scarring, asymmetric to the right. There is faint focal round opacity in the right midlung. IMPRESSION: Faint focal round opacity in the right midlung, cannot exclude a pulmonary nodule. Correlation with chest CT is advised. Electronically Signed   By: Ilona Sorrel M.D.   On: 07/07/2015 08:26   Dg Wrist Complete Right  07/07/2015  CLINICAL DATA:  Fall with right wrist pain EXAM: RIGHT WRIST - COMPLETE 3+ VIEW COMPARISON:  None. FINDINGS: There is prominent soft tissue swelling in the radial distal right forearm. No fracture, dislocation or suspicious focal osseous lesion. Severe osteoarthritis in the scaphoid- trapezium, first carpometacarpal and first metacarpophalangeal joints. Vascular calcifications throughout the soft tissues. IMPRESSION: Prominent soft tissue swelling in the radial distal right forearm. No fracture or dislocation in the right wrist. Polyarticular osteoarthritis as described. Electronically Signed   By: Ilona Sorrel M.D.   On: 07/07/2015 08:22   Pelvis Portable  07/08/2015  CLINICAL DATA:  Left hip replacement today for subcapital fracture. Initial encounter. EXAM: PORTABLE PELVIS 1-2 VIEWS COMPARISON:  Plain films left hip  07/07/2015. FINDINGS: New left hip arthroplasty is in place. The device is located. There is no fracture. Surgical staples are noted. No acute abnormality. IMPRESSION: Status post left hip replacement without evidence of complication. Electronically Signed   By: Inge Rise M.D.   On: 07/08/2015 14:10   Dg Hip Unilat With Pelvis 2-3 Views Left  07/07/2015  CLINICAL DATA:  Fall with left hip pain.  Initial encounter. EXAM: DG HIP (WITH OR WITHOUT PELVIS) 2-3V LEFT COMPARISON:  None. FINDINGS: Subcapital left femoral neck fracture with lateral displacement and mild posterior impaction with anterior fracture widening. The left femoral head is located. No notable degenerative changes to the acetabulum. The right hip is intact and located. No  evidence of pelvic ring fracture or diastasis. Osteopenia and extensive atherosclerosis. IMPRESSION: Subcapital left femoral neck fracture. Electronically Signed   By: Monte Fantasia M.D.   On: 07/07/2015 07:47        Scheduled Meds: . [START ON 07/09/2015] aspirin EC  325 mg Oral BID AC & HS  . atorvastatin  20 mg Oral Daily  . carvedilol  25 mg Oral BID WC  . docusate sodium  100 mg Oral BID  . doxazosin  4 mg Oral BID  . enoxaparin (LOVENOX) injection  30 mg Subcutaneous Q24H  . ferrous sulfate  325 mg Oral Q breakfast  . finasteride  5 mg Oral Daily  . LORazepam  0.5 mg Intravenous Once  . magnesium hydroxide  7.5 mL Oral QHS  . mirtazapine  15 mg Oral QHS  . polyethylene glycol  17 g Oral QHS  . senna  1 tablet Oral QID  . sertraline  25 mg Oral Daily  . sodium chloride  3 mL Intravenous Q12H  . vancomycin  500 mg Intravenous Q12H  . vitamin B-12  1,000 mcg Oral QAC breakfast  . vitamin C  500 mg Oral QAC breakfast   Continuous Infusions: . sodium chloride 100 mL/hr at 07/07/15 1021    Active Problems:   Bursitis of right hip   CHF (congestive heart failure) (HCC)   PVD (peripheral vascular disease) (HCC)   CAD (coronary artery disease)   CKD (chronic kidney disease) stage 3, GFR 30-59 ml/min   Diabetes mellitus type II, controlled ([place])   HTN (hypertension)   Anemia   Diabetic polyneuropathy (HCC)   Hip fracture ([place])    Time spent: 25 minutes.    Vernell Leep, MD, FACP, FHM. [facility] Pager [phone]  If 7PM-7AM, please contact night-coverage [email or web address] Password [email or web address] 07/08/2015, 4:47 PM    LOS: 1 day

## 2015-07-08 NOTE — Consult Note (Signed)
Reason for Consult: heart failure   Referring Physician: Dr. Algis Liming   PCP:  Horatio Pel, MD  Primary Cardiologist:Dr. Jossiah Smoak is an 82 y.o. male.    Chief Complaint: pt admitted 07/07/15 for Lt femoral neck fracture, with Lt hip hemiarthroplasty 07/07/14.   HPI: 82 year old male with hx of DM2, HTN CAD- followed by Dr.Tilley, chronic CHF, PAD dyslipidemia and renal artery stents presented after mechanical fall at home.  He has severe peripheral neuropathy.  Also PVD with hx fem-pop bypass.   No chest pain, no SOB, no syncope.  He goes up and down stairs without SOB prior to hospitalization.    Family stated no cardiac issues for some time.   Echo in 2012 with EF 60-65% with aortic valve mildly calcified, MV mild to mod calcification with mild regurg. + moderate LVH.    On admit BNP 459, Cr 2.06, Mag. 3.0, TSH 2.94  Initially dehydrated but Cr improved with fluid.  CXR with faint focal round opacity Rt mid lung no CHF.  EKG is read as a fib but is with baseline artifact and is SR with PACs.  New ST depression in V5-6 new from 2015.  Tele is SR.   Today post op Lt hip hemiarthroplasty resting in bed no chest pain.  Currently sleepy, mildly confused or too tired to answer. No pain or SOB.   Past Medical History  Diagnosis Date  . Kidney disease   . Diabetes mellitus   . Hypertension   . MI (myocardial infarction) ([place])     1974  . Peripheral vascular disease ([place])     veinsi rerouted in both legs   . Pneumonia     hx of   . Anemia   . Blood transfusion     hx of 6 months ago   . GERD (gastroesophageal reflux disease)   . Arthritis   . Bowel obstruction ([place])   . PONV (postoperative nausea and vomiting)   . Gait disorder   . Depression   . Dyslipidemia   . Heart disease (organic)   . C. difficile colitis   . History of GI bleed     Upper  . Lumbosacral spinal stenosis   . Memory deficit 11/29/2012  . Degenerative arthritis   . Peptic  ulcer disease   . Wears glasses   . HOH (hard of hearing)     Past Surgical History  Procedure Laterality Date  . Back surgery      back surgery x 2   . Eye surgery      bilateral cataract surgery with implants   . Other surgical history      2 fatty tumors removed - on different arms   . Other surgical history      prostate surgery x 3   . Other surgical history      veins rerouted in both legs   . Other surgical history      hemorrhoid surgery  . Other surgical history      carpal tunnel right   . Other surgical history      big toe nails removed   . Other surgical history      repair of blood vessel in esophagus and stomach- 2006  . Other surgical history      bilateral kidney stents  2007  . Other surgical history      small bowel obstruction 2011   . Excision/release  bursa hip  08/13/2011    Procedure: EXCISION/RELEASE BURSA HIP;  Surgeon: Tobi Bastos, MD;  Location: [facility];  Service: Orthopedics;  Laterality: Right;  Right Hip Excision Tumor  . Esophageal manometry  11/08/2011    Procedure: ESOPHAGEAL MANOMETRY (EM);  Surgeon: Beryle Beams, MD;  Location: [facility];  Service: Endoscopy;  Laterality: N/A;  . Hemorrhoid surgery    . Cataract extraction Bilateral   . Lipoma resection    . Transurethral resection of prostate      For benign prostate hypertrophy  . Carpal tunnel release Bilateral   . Ureteral stent placement Bilateral   . Cervical laminectomy    . Skin cancer resection      Left forehead  . Right hip surgery    . Femoral-popliteal bypass graft Left 07-08-1997    Curt Jews MD  . Femoral-popliteal bypass graft Right 06-04-1997    Curt Jews MD  . Carpal tunnel release Left 11/20/2013    Procedure: LEFT  CARPAL TUNNEL RELEASE;  Surgeon: Wynonia Sours, MD;  Location: [facility];  Service: Orthopedics;  Laterality: Left;  . Hypothenar fat pad transfer Left 11/20/2013    Procedure: HYPOTHENAR FAT PAD TRANSFER LEFT;  Surgeon: Wynonia Sours, MD;  Location: [facility];  Service: Orthopedics;  Laterality: Left;    Family History  Problem Relation Age of Onset  . Cancer Sister   . Heart disease Father    Social History:  reports that he quit smoking about 42 years ago. He has never used smokeless tobacco. He reports that he does not drink alcohol or use illicit drugs.  Allergies:  Allergies  Allergen Reactions  . Amoxicillin Swelling    Has patient had a PCN reaction causing immediate rash, facial/tongue/throat swelling, SOB or lightheadedness with hypotension: Unknown Has patient had a PCN reaction causing severe rash involving mucus membranes or skin necrosis: Unknown Has patient had a PCN reaction that required hospitalization: Unknown Has patient had a PCN reaction occurring within the last 10 years: Unknown If all of the above answers are "NO", then may proceed with Cephalosporin use.   Marland Kitchen Apresoline Esidrix [Hydralazine-Hctz] Itching and Swelling  . Hctz [Hydrochlorothiazide] Other (See Comments)    hyponatremia  . Tetracyclines & Related   . Levaquin [Levofloxacin In D5w]     Unknown reaction  . Serzone [Nefazodone] Other (See Comments)    Felt bad  . Biaxin [Clarithromycin] Rash  . Other Itching    CT CONTRAST DYE    OUTPATIENT MEDICATIONS: No current facility-administered medications on file prior to encounter.   Current Outpatient Prescriptions on File Prior to Encounter  Medication Sig Dispense Refill  . aspirin EC 81 MG tablet Take 81 mg by mouth daily before breakfast.     . atorvastatin (LIPITOR) 20 MG tablet Take 20 mg by mouth daily.    . carvedilol (COREG) 25 MG tablet Take 25 mg by mouth 2 (two) times daily with a meal.    . doxazosin (CARDURA) 4 MG tablet Take 4 mg by mouth 2 (two) times daily.     . ferrous sulfate 325 (65 FE) MG tablet Take 325 mg by mouth daily with breakfast.    . finasteride (PROSCAR) 5 MG tablet Take 5 mg by mouth daily.    . furosemide (LASIX) 40 MG  tablet Take 40 mg by mouth 3 (three) times daily.     Marland Kitchen losartan (COZAAR) 100 MG tablet Take 50 mg by mouth  daily.     . mirtazapine (REMERON) 15 MG tablet Take 15 mg by mouth at bedtime.    . Multiple Vitamin (MULITIVITAMIN WITH MINERALS) TABS Take 1 tablet by mouth daily before breakfast.     . oxyCODONE-acetaminophen (PERCOCET) 7.5-325 MG tablet Take 1 tablet by mouth every 6 (six) hours as needed. (Patient taking differently: Take 1 tablet by mouth at bedtime. ) 90 tablet 0  . polyethylene glycol (MIRALAX / GLYCOLAX) packet Take 17 g by mouth at bedtime.     . senna (SENOKOT) 8.6 MG tablet Take 1 tablet by mouth 4 (four) times daily.    . sertraline (ZOLOFT) 25 MG tablet Take 25 mg by mouth daily.    Marland Kitchen UNABLE TO FIND Apply 1 application topically daily. Med Name: Magnlife DB-foot cream    . vitamin B-12 (CYANOCOBALAMIN) 1000 MCG tablet Take 1,000 mcg by mouth daily before breakfast.     . vitamin C (ASCORBIC ACID) 500 MG tablet Take 500 mg by mouth daily before breakfast.     . lidocaine (XYLOCAINE) 5 % ointment Apply 1 application topically as needed. (Patient not taking: Reported on 07/07/2015) 35.44 g 1   CURRENT MEDICATIONS: Scheduled Meds: . [START ON 07/09/2015] aspirin EC  325 mg Oral BID AC & HS  . atorvastatin  20 mg Oral Daily  . carvedilol  25 mg Oral BID WC  . docusate sodium  100 mg Oral BID  . doxazosin  4 mg Oral BID  . enoxaparin (LOVENOX) injection  30 mg Subcutaneous Q24H  . ferrous sulfate  325 mg Oral Q breakfast  . finasteride  5 mg Oral Daily  . LORazepam  0.5 mg Intravenous Once  . magnesium hydroxide  7.5 mL Oral QHS  . mirtazapine  15 mg Oral QHS  . polyethylene glycol  17 g Oral QHS  . senna  1 tablet Oral QID  . sertraline  25 mg Oral Daily  . sodium chloride  3 mL Intravenous Q12H  . vancomycin  500 mg Intravenous Q12H  . vitamin B-12  1,000 mcg Oral QAC breakfast  . vitamin C  500 mg Oral QAC breakfast   Continuous Infusions: . sodium chloride 10  mL/hr at 07/08/15 1733   PRN Meds:.acetaminophen **OR** acetaminophen, alum & mag hydroxide-simeth, HYDROmorphone (DILAUDID) injection, levalbuterol, menthol-cetylpyridinium **OR** phenol, metoCLOPramide **OR** metoCLOPramide (REGLAN) injection, ondansetron **OR** ondansetron (ZOFRAN) IV, oxyCODONE-acetaminophen **AND** oxyCODONE, polyethylene glycol   Results for orders placed or performed during the hospital encounter of 07/07/15 (from the past 48 hour(s))  CBC with Differential/Platelet     Status: Abnormal   Collection Time: 07/07/15  7:41 AM  Result Value Ref Range   WBC 7.5 4.0 - 10.5 K/uL   RBC 3.75 (L) 4.22 - 5.81 MIL/uL   Hemoglobin 11.0 (L) 13.0 - 17.0 g/dL   HCT 33.6 (L) 39.0 - 52.0 %   MCV 89.6 78.0 - 100.0 fL   MCH 29.3 26.0 - 34.0 pg   MCHC 32.7 30.0 - 36.0 g/dL   RDW 14.1 11.5 - 15.5 %   Platelets 144 (L) 150 - 400 K/uL   Neutrophils Relative % 76 %   Neutro Abs 5.7 1.7 - 7.7 K/uL   Lymphocytes Relative 13 %   Lymphs Abs 1.0 0.7 - 4.0 K/uL   Monocytes Relative 7 %   Monocytes Absolute 0.5 0.1 - 1.0 K/uL   Eosinophils Relative 4 %   Eosinophils Absolute 0.3 0.0 - 0.7 K/uL   Basophils Relative 0 %  Basophils Absolute 0.0 0.0 - 0.1 K/uL  Basic metabolic panel     Status: Abnormal   Collection Time: 07/07/15  7:41 AM  Result Value Ref Range   Sodium 140 135 - 145 mmol/L   Potassium 4.6 3.5 - 5.1 mmol/L   Chloride 99 (L) 101 - 111 mmol/L   CO2 27 22 - 32 mmol/L   Glucose, Bld 168 (H) 65 - 99 mg/dL   BUN 50 (H) 6 - 20 mg/dL   Creatinine, Ser 2.14 (H) 0.61 - 1.24 mg/dL   Calcium 8.8 (L) 8.9 - 10.3 mg/dL   GFR calc non Af Amer 25 (L) >60 mL/min   GFR calc Af Amer 29 (L) >60 mL/min    Comment: (NOTE) The eGFR has been calculated using the CKD EPI equation. This calculation has not been validated in all clinical situations. eGFR's persistently <60 mL/min signify possible Chronic Kidney Disease.    Anion gap 14 5 - 15  Urinalysis, Routine w reflex microscopic (not  at [facility])     Status: None   Collection Time: 07/07/15 10:14 AM  Result Value Ref Range   Color, Urine YELLOW YELLOW   APPearance CLEAR CLEAR   Specific Gravity, Urine 1.013 1.005 - 1.030   pH 7.5 5.0 - 8.0   Glucose, UA NEGATIVE NEGATIVE mg/dL   Hgb urine dipstick NEGATIVE NEGATIVE   Bilirubin Urine NEGATIVE NEGATIVE   Ketones, ur NEGATIVE NEGATIVE mg/dL   Protein, ur NEGATIVE NEGATIVE mg/dL   Nitrite NEGATIVE NEGATIVE   Leukocytes, UA NEGATIVE NEGATIVE    Comment: MICROSCOPIC NOT DONE ON URINES WITH NEGATIVE PROTEIN, BLOOD, LEUKOCYTES, NITRITE, OR GLUCOSE <1000 mg/dL.  CBG monitoring, ED     Status: Abnormal   Collection Time: 07/07/15 11:09 AM  Result Value Ref Range   Glucose-Capillary 159 (H) 65 - 99 mg/dL  CBC     Status: Abnormal   Collection Time: 07/07/15 11:21 AM  Result Value Ref Range   WBC 14.3 (H) 4.0 - 10.5 K/uL   RBC 4.03 (L) 4.22 - 5.81 MIL/uL   Hemoglobin 11.8 (L) 13.0 - 17.0 g/dL   HCT 37.1 (L) 39.0 - 52.0 %   MCV 92.1 78.0 - 100.0 fL   MCH 29.3 26.0 - 34.0 pg   MCHC 31.8 30.0 - 36.0 g/dL   RDW 14.4 11.5 - 15.5 %   Platelets 151 150 - 400 K/uL  Creatinine, serum     Status: Abnormal   Collection Time: 07/07/15 11:21 AM  Result Value Ref Range   Creatinine, Ser 2.06 (H) 0.61 - 1.24 mg/dL   GFR calc non Af Amer 26 (L) >60 mL/min   GFR calc Af Amer 31 (L) >60 mL/min    Comment: (NOTE) The eGFR has been calculated using the CKD EPI equation. This calculation has not been validated in all clinical situations. eGFR's persistently <60 mL/min signify possible Chronic Kidney Disease.   Magnesium     Status: Abnormal   Collection Time: 07/07/15 11:21 AM  Result Value Ref Range   Magnesium 3.0 (H) 1.7 - 2.4 mg/dL  TSH     Status: None   Collection Time: 07/07/15 11:21 AM  Result Value Ref Range   TSH 2.946 0.350 - 4.500 uIU/mL  Hemoglobin A1c     Status: Abnormal   Collection Time: 07/07/15 11:21 AM  Result Value Ref Range   Hgb A1c MFr Bld 6.4 (H) 4.8 -  5.6 %    Comment: (NOTE)  Pre-diabetes: 5.7 - 6.4         Diabetes: >6.4         Glycemic control for adults with diabetes: <7.0    Mean Plasma Glucose 137 mg/dL    Comment: (NOTE) Performed At: [facility] [place], [place] [id number] Lindon Romp MD ST:[id number]   Hepatic function panel     Status: None   Collection Time: 07/07/15 11:21 AM  Result Value Ref Range   Total Protein 7.4 6.5 - 8.1 g/dL   Albumin 4.1 3.5 - 5.0 g/dL   AST 26 15 - 41 U/L   ALT 18 17 - 63 U/L   Alkaline Phosphatase 72 38 - 126 U/L   Total Bilirubin 0.8 0.3 - 1.2 mg/dL   Bilirubin, Direct 0.1 0.1 - 0.5 mg/dL   Indirect Bilirubin 0.7 0.3 - 0.9 mg/dL  Brain natriuretic peptide     Status: Abnormal   Collection Time: 07/07/15 11:21 AM  Result Value Ref Range   B Natriuretic Peptide 459.1 (H) 0.0 - 100.0 pg/mL  CBG monitoring, ED     Status: Abnormal   Collection Time: 07/07/15  4:42 PM  Result Value Ref Range   Glucose-Capillary 172 (H) 65 - 99 mg/dL  Surgical pcr screen     Status: Abnormal   Collection Time: 07/08/15  4:20 AM  Result Value Ref Range   MRSA, PCR POSITIVE (A) NEGATIVE   Staphylococcus aureus POSITIVE (A) NEGATIVE    Comment:        The Xpert SA Assay (FDA approved for NASAL specimens in patients over 80 years of age), is one component of a comprehensive surveillance program.  Test performance has been validated by [facility] for patients greater than or equal to 32 year old. It is not intended to diagnose infection nor to guide or monitor treatment. RESULT CALLED TO, READ BACK BY AND VERIFIED WITH: @ 7:52 ON 001/17/2017 BY K.PEELE   Glucose, capillary     Status: Abnormal   Collection Time: 07/08/15  6:24 AM  Result Value Ref Range   Glucose-Capillary 140 (H) 65 - 99 mg/dL  Glucose, capillary     Status: Abnormal   Collection Time: 07/08/15  8:09 AM  Result Value Ref Range   Glucose-Capillary 142 (H) 65 - 99 mg/dL  Glucose, capillary      Status: Abnormal   Collection Time: 07/08/15 11:40 AM  Result Value Ref Range   Glucose-Capillary 145 (H) 65 - 99 mg/dL   Comment 1 Notify RN    Comment 2 Document in Chart   Glucose, capillary     Status: Abnormal   Collection Time: 07/08/15 12:52 PM  Result Value Ref Range   Glucose-Capillary 152 (H) 65 - 99 mg/dL   Comment 1 Repeat Test    Comment 2 Document in Chart    Dg Chest 1 View  07/07/2015  CLINICAL DATA:  Preoperative for left femur fracture repair. EXAM: CHEST 1 VIEW COMPARISON:  05/24/2012 chest radiograph. FINDINGS: Partially visualized is surgical plate with interlocking screws overlying the lower cervical spine. Stable cardiomediastinal silhouette with normal heart size. No pneumothorax. No pleural effusion. Stable biapical pleural-parenchymal scarring, asymmetric to the right. There is faint focal round opacity in the right midlung. IMPRESSION: Faint focal round opacity in the right midlung, cannot exclude a pulmonary nodule. Correlation with chest CT is advised. Electronically Signed   By: Ilona Sorrel M.D.   On: 07/07/2015 08:26   Dg Wrist Complete Right  07/07/2015  CLINICAL DATA:  Fall with right wrist pain EXAM: RIGHT WRIST - COMPLETE 3+ VIEW COMPARISON:  None. FINDINGS: There is prominent soft tissue swelling in the radial distal right forearm. No fracture, dislocation or suspicious focal osseous lesion. Severe osteoarthritis in the scaphoid- trapezium, first carpometacarpal and first metacarpophalangeal joints. Vascular calcifications throughout the soft tissues. IMPRESSION: Prominent soft tissue swelling in the radial distal right forearm. No fracture or dislocation in the right wrist. Polyarticular osteoarthritis as described. Electronically Signed   By: Ilona Sorrel M.D.   On: 07/07/2015 08:22   Pelvis Portable  07/08/2015  CLINICAL DATA:  Left hip replacement today for subcapital fracture. Initial encounter. EXAM: PORTABLE PELVIS 1-2 VIEWS COMPARISON:  Plain films  left hip 07/07/2015. FINDINGS: New left hip arthroplasty is in place. The device is located. There is no fracture. Surgical staples are noted. No acute abnormality. IMPRESSION: Status post left hip replacement without evidence of complication. Electronically Signed   By: Inge Rise M.D.   On: 07/08/2015 14:10   Dg Hip Unilat With Pelvis 2-3 Views Left  07/07/2015  CLINICAL DATA:  Fall with left hip pain.  Initial encounter. EXAM: DG HIP (WITH OR WITHOUT PELVIS) 2-3V LEFT COMPARISON:  None. FINDINGS: Subcapital left femoral neck fracture with lateral displacement and mild posterior impaction with anterior fracture widening. The left femoral head is located. No notable degenerative changes to the acetabulum. The right hip is intact and located. No evidence of pelvic ring fracture or diastasis. Osteopenia and extensive atherosclerosis. IMPRESSION: Subcapital left femoral neck fracture. Electronically Signed   By: Monte Fantasia M.D.   On: 07/07/2015 07:47    ROS: General:no colds or fevers, no weight changes Skin:no rashes or ulcers HEENT:no blurred vision, no congestion CV:see HPI PUL:see HPI GI:no diarrhea constipation or melena, no indigestion GU:no hematuria, no dysuria MS:no joint pain, no claudication, + mechanical fall Neuro:no syncope, no lightheadedness Endo:+ diabetes, no thyroid disease   Blood pressure 132/49, pulse 53, temperature 98.4 F (36.9 C), temperature source Oral, resp. rate 18, SpO2 93 %.  Wt Readings from Last 3 Encounters:  12/05/13 179 lb (81.194 kg)  11/20/13 179 lb (81.194 kg)  06/08/13 177 lb (80.287 kg)    PE:   General:Pleasant affect, NAD Skin:Warm and dry, brisk capillary refill HEENT:normocephalic, sclera clear, mucus membranes moist Neck:supple, no JVD, no bruits  Heart:S1S2 RRR without murmur, gallup, rub or click Lungs:clear without rales, rhonchi, or wheezes ZOX:WRUE, non tender, + BS, do not palpate liver spleen or masses Ext:no lower ext  edema, 2+ pedal pulses, 2+ radial pulses Neuro:alert and oriented, MAE, follows commands, + facial symmetry  Assessment/Plan Active Problems:   Bursitis of right hip   CHF (congestive heart failure) (HCC)   PVD (peripheral vascular disease) (HCC)   CAD (coronary artery disease)   CKD (chronic kidney disease) stage 3, GFR 30-59 ml/min   Diabetes mellitus type II, controlled ([place])   HTN (hypertension)   Anemia   Diabetic polyneuropathy (HCC)   Hip fracture (HCC)  Lt femoral neck fracture s/p hemiarthroplasty today - stable, ortho following  CAD stable no chest pain will have Dr. Wynonia Lawman follow tomorrow.  Chronic diastolic HF with last EF 4540 60-65% and mod LVH. To resume home lasix tomorrow.  An echo has been ordered.  HTN  controlled post op  SBrady at times to 53 on VS. Monitor  CKD-3 improved after fluids on admit  Other problems per IM.   Other notes per Dr. Percival Spanish.  [place]  Nurse Practitioner Certified [place] Pager [phone] or after 5pm or weekends call [phone] 07/08/2015, 5:16 PM  History and all data above reviewed.  Patient examined.  I agree with the findings as above.  We are called to see this patient with a history of CAD and chronic diastolic HF.  He is post op.  He is very somnolent and can only stay awake for a few moments at a time.  From the notes and his short answers he has had no recent acute cardiac complaints.  He currently is in no distress and he has no SOB.  He denies any pain.  He fell and there is no mention of syncope.   The patient exam reveals COR:RRR, brief systolic murmur  ,  Lungs: Clear  ,  Abd: Positive bowel sounds, no rebound no guarding, Ext Mild lower extremity edema  .  All available labs, radiology testing, previous records reviewed. Agree with documented assessment and plan.   CAD:  No evidence of active ischemia.  No resume previous meds.  He seems to be euvolemic.  No change in therapy.  Dr.  Wynonia Lawman will follow along with you during this admission.   Jeneen Rinks Rogenia Werntz  6:06 PM  07/08/2015

## 2015-07-08 NOTE — Clinical Documentation Improvement (Signed)
Internal Medicine  Can the diagnosis of Heart Failure  be further specified if appropriate for this admission? Thank you    Acuity - Acute, Chronic, Acute on Chronic   Type - Systolic, Diastolic, Systolic and Diastolic  Other  Clinically Undetermined   Document any associated diagnoses/conditions   Supporting Information: bnp 459.1   Please exercise your independent, professional judgment when responding. A specific answer is not anticipated or expected.   Thank You,  [place] [phone]

## 2015-07-08 NOTE — Progress Notes (Signed)
Utilization review completed.  

## 2015-07-08 NOTE — Anesthesia Procedure Notes (Signed)
Procedure Name: Intubation Date/Time: 07/08/2015 9:55 AM Performed by: Tressia Miners LEFFEW Pre-anesthesia Checklist: Patient identified, Patient being monitored, Timeout performed, Emergency Drugs available and Suction available Patient Re-evaluated:Patient Re-evaluated prior to inductionOxygen Delivery Method: Circle System Utilized Preoxygenation: Pre-oxygenation with 100% oxygen Intubation Type: IV induction Ventilation: Mask ventilation without difficulty and Oral airway inserted - appropriate to patient size Laryngoscope Size: Mac and 3 Grade View: Grade I Tube type: Oral Tube size: 7.5 mm Number of attempts: 1 Airway Equipment and Method: Stylet Placement Confirmation: ETT inserted through vocal cords under direct vision,  positive ETCO2 and breath sounds checked- equal and bilateral Secured at: 23 cm Tube secured with: Tape Dental Injury: Teeth and Oropharynx as per pre-operative assessment

## 2015-07-08 NOTE — Interval H&P Note (Signed)
OK for surgery PD 

## 2015-07-08 NOTE — Op Note (Signed)
PRE-OP DIAGNOSIS:  left femroal neck fracture POST-OP DIAGNOSIS:  left femroal neck fracture PROCEDURE:  Procedure(s): LEFT ARTHROPLASTY UNIPOLAR HIP (HEMIARTHROPLASTY) ANESTHESIA:  general SURGEON:  Melrose Nakayama MD ASSISTANT:  Loni Dolly PA-C   INDICATIONS FOR PROCEDURE:  The patient is a 82 y.o. male with a recent history of a painful hip and x-rays which show a displaced femoral neck fracture.  Hemiarthroplasty is offered as surgical treatment.  Informed operative consent was obtained after discussion of possible complications including reaction to anesthesia, infection, neurovascular injury, dislocation, DVT, PE, and death.  The importance of the postoperative rehab program to optimize result was stressed with the patient.  SUMMARY OF FINDINGS AND PROCEDURE:  Under general anesthesia through a standard posterior approach a hip hemiarthroplasty was performed.  The patient had no degenerative change and good bone quality.  We used DePuy components to address the hip fracture and these were size summit basic 6 femur capped with a +0 56 hip ball.  Loni Dolly PA-C assisted throughout and was invaluable to the completion of the case in that he helped position and retract while I performed the procedure.  He also closed simultaneously to help minimize OR time.  DESCRIPTION OF PROCEDURE:  The patient was taken to the OR suite where general anesthetic was applied.  The patient was then positioned in the lateral decubitus position with the operative hip up.  All bony prominences were appropriately padded, hip positioners were utilized, and an axillary roll was placed.  Prep and drape was then performed in normal sterile fashion.  The patient was given clindamycin preoperative antibiotic and an appropriate time out was performed.  We then took a posterior approach to the right hip.  Dissection was taken through adipose to the IT band and gluteus maximus fascia.  These structures were incised longitudinally  to expose the short external rotators of the hip which were tagged and reflected.  A partial posterior capsulectomy was performed and the hip was dislocated.  A femoral neck cut was made below the fracture site and the femoral head was removed.  The femur was reamed and then broached to the appropriate size.  A trial reduction was done and the aforementioned head and neck assembly gave [place] the best stability in extension with external rotation and flexion with internal rotation.  Leg lengths were felt to be about equal.  The trial components were removed and the wound irrigated.  We elected to use a an uncemented femoral component.  We then placed the femoral component in appropriate anteversion.  The head was applied to a dry stem neck and the hip again reduced.  It was again stable in the aforementioned positions.  The would was irrigated again followed by re-approximation of short external rotators to the greater trochanteric region.  IT band and gluteus maximum fascia were repaired with #1 vicryl followed by subcutaneous closure with #O and #2 undyed vicryl.  Skin was closed with staples followed by a sterile dressing.  EBL and IOF can be obtained from anesthesia records.  DISPOSITION:  The patient was extubated in the OR and taken to PACU in stable condition to be admitted back to the medicine service  for appropriate post-op care to include perioperative antibiotics and DVT prophylaxis.

## 2015-07-08 NOTE — H&P (View-Only) (Signed)
Melrose Nakayama, MD  Chauncey Cruel, PA-C  Loni Dolly, PA-C                                  [facility]                [address] [place], [place]  [address]   ORTHOPAEDIC CONSULTATION  [name]            MRN:  [id number] DOB/SEX:  1921-04-10/male     CHIEF COMPLAINT:  Painful left hip  HISTORY: [name] a 82 y.o. male with broken hip.  Fell in bathroom at home early this morning in [place].  Was taken to [facility] where hip fracture was diagnosed.  Was transferred to [facility] for surgery tomorrow.  Admitted to medical service.  Denies pain elsewhere including at his neck.  No LOC   PAST MEDICAL HISTORY: Patient Active Problem List   Diagnosis Date Noted  . Hip fracture ([place]) 07/07/2015  . Peripheral vascular disease, unspecified ([place]) 12/12/2012  . Aftercare following surgery of the circulatory system, [place] 12/12/2012  . Memory deficit 11/29/2012  . Carpal tunnel syndrome 07/18/2012  . Spinal stenosis, lumbar region, without neurogenic claudication 07/18/2012  . Other vitamin B12 deficiency anemia 07/18/2012  . Other general symptoms(780.99) 07/18/2012  . Abnormality of gait 07/18/2012  . Diabetic polyneuropathy ([place]) 07/18/2012  . CHF, acute on chronic ([place]) 05/26/2012  . Leukocytosis 05/25/2012  . Anemia 05/25/2012  . Hyponatremia 05/25/2012  . CHF (congestive heart failure) ([place]) 05/24/2012  . PVD (peripheral vascular disease) ([place]) 05/24/2012  . CAD (coronary artery disease) 05/24/2012  . CKD (chronic kidney disease) stage 3, GFR 30-59 ml/min 05/24/2012  . Diabetes mellitus type II, controlled ([place]) 05/24/2012  . HTN (hypertension) 05/24/2012  . Bursitis of right hip 08/13/2011   Past Medical History  Diagnosis Date  . Kidney disease   . Diabetes mellitus   . Hypertension   . MI (myocardial infarction) ([place])     1974  . Peripheral vascular disease ([place])     veinsi rerouted in both legs   . Pneumonia     hx of   . Anemia   . Blood  transfusion     hx of 6 months ago   . GERD (gastroesophageal reflux disease)   . Arthritis   . Bowel obstruction ([place])   . PONV (postoperative nausea and vomiting)   . Gait disorder   . Depression   . Dyslipidemia   . Heart disease (organic)   . C. difficile colitis   . History of GI bleed     Upper  . Lumbosacral spinal stenosis   . Memory deficit 11/29/2012  . Degenerative arthritis   . Peptic ulcer disease   . Wears glasses   . HOH (hard of hearing)    Past Surgical History  Procedure Laterality Date  . Back surgery      back surgery x 2   . Eye surgery      bilateral cataract surgery with implants   . Other surgical history      2 fatty tumors removed - on different arms   . Other surgical history      prostate surgery x 3   . Other surgical history      veins rerouted in both legs   . Other surgical history      hemorrhoid surgery  . Other surgical history  carpal tunnel right   . Other surgical history      big toe nails removed   . Other surgical history      repair of blood vessel in esophagus and stomach- 2006  . Other surgical history      bilateral kidney stents  2007  . Other surgical history      small bowel obstruction 2011   . Excision/release bursa hip  08/13/2011    Procedure: EXCISION/RELEASE BURSA HIP;  Surgeon: Tobi Bastos, MD;  Location: [facility];  Service: Orthopedics;  Laterality: Right;  Right Hip Excision Tumor  . Esophageal manometry  11/08/2011    Procedure: ESOPHAGEAL MANOMETRY (EM);  Surgeon: Beryle Beams, MD;  Location: [facility];  Service: Endoscopy;  Laterality: N/A;  . Hemorrhoid surgery    . Cataract extraction Bilateral   . Lipoma resection    . Transurethral resection of prostate      For benign prostate hypertrophy  . Carpal tunnel release Bilateral   . Ureteral stent placement Bilateral   . Cervical laminectomy    . Skin cancer resection      Left forehead  . Right hip surgery    . Femoral-popliteal bypass  graft Left 07-08-1997    Curt Jews MD  . Femoral-popliteal bypass graft Right 06-04-1997    Curt Jews MD  . Carpal tunnel release Left 11/20/2013    Procedure: LEFT  CARPAL TUNNEL RELEASE;  Surgeon: Wynonia Sours, MD;  Location: [facility];  Service: Orthopedics;  Laterality: Left;  . Hypothenar fat pad transfer Left 11/20/2013    Procedure: HYPOTHENAR FAT PAD TRANSFER LEFT;  Surgeon: Wynonia Sours, MD;  Location: [facility];  Service: Orthopedics;  Laterality: Left;     MEDICATIONS:   Current facility-administered medications:  .  0.9 %  sodium chloride infusion, , Intravenous, Continuous, Reyne Dumas, MD, Last Rate: 100 mL/hr at 07/07/15 1021 .  0.9 %  sodium chloride infusion, , Intravenous, Continuous, Reyne Dumas, MD, 0  at 07/07/15 1021 .  oxyCODONE (ROXICODONE) 5 MG/5ML solution 7.5 mg, 7.5 mg, Oral, Q6H PRN, 7.5 mg at 07/07/15 1553 **AND** acetaminophen (TYLENOL) solution 325 mg, 325 mg, Oral, Q6H PRN, Reyne Dumas, MD .  acetaminophen (TYLENOL) tablet 650 mg, 650 mg, Oral, Q6H PRN **OR** acetaminophen (TYLENOL) suppository 650 mg, 650 mg, Rectal, Q6H PRN, Reyne Dumas, MD .  alum & mag hydroxide-simeth (MAALOX/MYLANTA) 200-200-20 MG/5ML suspension 30 mL, 30 mL, Oral, Q6H PRN, Reyne Dumas, MD .  Derrill Memo ON 07/08/2015] aspirin EC tablet 81 mg, 81 mg, Oral, QAC breakfast, Reyne Dumas, MD .  atorvastatin (LIPITOR) tablet 20 mg, 20 mg, Oral, Daily, Reyne Dumas, MD, Stopped at 07/07/15 1601 .  carvedilol (COREG) tablet 25 mg, 25 mg, Oral, BID WC, Reyne Dumas, MD, 25 mg at 07/07/15 1705 .  docusate sodium (COLACE) capsule 100 mg, 100 mg, Oral, BID, Reyne Dumas, MD, 100 mg at 07/07/15 1029 .  doxazosin (CARDURA) tablet 4 mg, 4 mg, Oral, BID, Reyne Dumas, MD, 4 mg at 07/07/15 1602 .  [START ON 07/08/2015] enoxaparin (LOVENOX) injection 30 mg, 30 mg, Subcutaneous, Q24H, Reyne Dumas, MD .  Derrill Memo ON 07/08/2015] ferrous sulfate tablet 325 mg, 325 mg, Oral, Q  breakfast, Reyne Dumas, MD .  finasteride (PROSCAR) tablet 5 mg, 5 mg, Oral, Daily, Reyne Dumas, MD, 5 mg at 07/07/15 1602 .  HYDROcodone-acetaminophen (NORCO/VICODIN) 5-325 MG per tablet 1-2 tablet, 1-2 tablet, Oral, Q4H PRN, Lacretia Leigh, MD .  HYDROmorphone (DILAUDID) injection 1 mg, 1 mg, Intravenous, Q4H PRN, Reyne Dumas, MD, 1 mg at 07/07/15 1029 .  levalbuterol (XOPENEX) nebulizer solution 0.63 mg, 0.63 mg, Nebulization, Q6H PRN, Reyne Dumas, MD .  LORazepam (ATIVAN) injection 0.5 mg, 0.5 mg, Intravenous, Once, Reyne Dumas, MD, Stopped at 07/07/15 1912 .  mirtazapine (REMERON) tablet 15 mg, 15 mg, Oral, QHS, Reyne Dumas, MD .  ondansetron (ZOFRAN) injection 4 mg, 4 mg, Intravenous, Q8H PRN, Lacretia Leigh, MD .  ondansetron [facility]) tablet 4 mg, 4 mg, Oral, Q6H PRN **OR** ondansetron (ZOFRAN) injection 4 mg, 4 mg, Intravenous, Q6H PRN, Reyne Dumas, MD, 4 mg at 07/07/15 1455 .  oxyCODONE-acetaminophen (PERCOCET) 7.5-325 MG per tablet 1 tablet, 1 tablet, Oral, Q6H PRN, Reyne Dumas, MD .  polyethylene glycol (MIRALAX / GLYCOLAX) packet 17 g, 17 g, Oral, QHS, Nayana Abrol, MD .  polyethylene glycol (MIRALAX / GLYCOLAX) packet 17 g, 17 g, Oral, Daily PRN, Reyne Dumas, MD .  sertraline (ZOLOFT) tablet 25 mg, 25 mg, Oral, Daily, Reyne Dumas, MD, 25 mg at 07/07/15 1602 .  sodium chloride 0.9 % injection 3 mL, 3 mL, Intravenous, Q12H, Reyne Dumas, MD, 3 mL at 07/07/15 1029 .  [START ON 07/08/2015] vitamin B-12 (CYANOCOBALAMIN) tablet 1,000 mcg, 1,000 mcg, Oral, QAC breakfast, Reyne Dumas, MD .  Derrill Memo ON 07/08/2015] vitamin C (ASCORBIC ACID) tablet 500 mg, 500 mg, Oral, QAC breakfast, Reyne Dumas, MD  ALLERGIES:   Allergies  Allergen Reactions  . Amoxicillin Swelling    Has patient had a PCN reaction causing immediate rash, facial/tongue/throat swelling, SOB or lightheadedness with hypotension: Unknown Has patient had a PCN reaction causing severe rash involving mucus membranes or skin  necrosis: Unknown Has patient had a PCN reaction that required hospitalization: Unknown Has patient had a PCN reaction occurring within the last 10 years: Unknown If all of the above answers are "NO", then may proceed with Cephalosporin use.   Marland Kitchen Apresoline Esidrix [Hydralazine-Hctz] Itching and Swelling  . Hctz [Hydrochlorothiazide] Other (See Comments)    hyponatremia  . Tetracyclines & Related   . Levaquin [Levofloxacin In D5w]     Unknown reaction  . Serzone [Nefazodone] Other (See Comments)    Felt bad  . Biaxin [Clarithromycin] Rash  . Other Itching    CT CONTRAST DYE    REVIEW OF SYSTEMS: REVIEWED IN DETAIL IN CHART  FAMILY HISTORY:   Family History  Problem Relation Age of Onset  . Cancer Sister     SOCIAL HISTORY:   Social History  Substance Use Topics  . Smoking status: Former Smoker    Quit date: 07/22/1972  . Smokeless tobacco: Never Used  . Alcohol Use: No     EXAMINATION: Vital signs in last 24 hours: Temp:  [97.9 F (36.6 C)-98.2 F (36.8 C)] 98.1 F (36.7 C) (01/16 1013) Pulse Rate:  [52-71] 71 (01/16 1946) Resp:  [14-22] 18 (01/16 2000) BP: (103-169)/(49-76) 111/54 mmHg (01/16 2000) SpO2:  [88 %-98 %] 95 % (01/16 1946)  BP 111/54 mmHg  Pulse 71  Temp(Src) 98.1 F (36.7 C) (Oral)  Resp 18  SpO2 95%  General Appearance:    Alert, cooperative, no distress, appears stated age  Head:    Normocephalic, without obvious abnormality, atraumatic  Eyes:    PERRL, conjunctiva/corneas clear, EOM's intact, fundi    benign, both eyes       Ears:    Normal TM's and external ear canals, both ears  Nose:   Nares normal, septum midline,  mucosa normal, no drainage    or sinus tenderness  Throat:   Lips, mucosa, and tongue no; teeth and gums normal  Neck:   Supple, symmetrical, trachea midline, no adenopathy;       thyroid:  No enlargement/tenderness/nodules; no carotid   bruit or JVD, moves neck fully  Back:     Symmetric, no curvature, ROM normal, no CVA  tenderness  Lungs:     Clear to auscultation bilaterally, respirations unlabored  Chest wall:    No tenderness or deformity  Heart:    Regular rate and rhythm, S1 and S2 normal, no murmur, rub   or gallop  Abdomen:     Soft, non-tender, bowel sounds active all four quadrants,    no masses, no organomegaly  Genitalia:    Rectal:    Extremities:   Extremities normal, atraumatic, no cyanosis or edema  Pulses:   2+ and symmetric all extremities  Skin:   Skin color, texture, turgor normal, no rashes or lesions  Lymph nodes:   Cervical, supraclavicular, and axillary nodes normal  Neurologic:   CNII-XII intact. Normal strength, sensation and reflexes      throughout    Musculoskeletal Exam:   Left leg SER, calves soft, UE move fully   DIAGNOSTIC STUDIES: Recent laboratory studies:  Recent Labs  07/07/15 0741 07/07/15 1121  WBC 7.5 14.3*  HGB 11.0* 11.8*  HCT 33.6* 37.1*  PLT 144* 151    Recent Labs  07/07/15 0741 07/07/15 1121  NA 140  --   K 4.6  --   CL 99*  --   CO2 27  --   BUN 50*  --   CREATININE 2.14* 2.06*  GLUCOSE 168*  --   CALCIUM 8.8*  --    Lab Results  Component Value Date   INR 1.13 08/09/2011     Recent Radiographic Studies :  Dg Chest 1 View  07/07/2015  CLINICAL DATA:  Preoperative for left femur fracture repair. EXAM: CHEST 1 VIEW COMPARISON:  05/24/2012 chest radiograph. FINDINGS: Partially visualized is surgical plate with interlocking screws overlying the lower cervical spine. Stable cardiomediastinal silhouette with normal heart size. No pneumothorax. No pleural effusion. Stable biapical pleural-parenchymal scarring, asymmetric to the right. There is faint focal round opacity in the right midlung. IMPRESSION: Faint focal round opacity in the right midlung, cannot exclude a pulmonary nodule. Correlation with chest CT is advised. Electronically Signed   By: Ilona Sorrel M.D.   On: 07/07/2015 08:26   Dg Wrist Complete Right  07/07/2015  CLINICAL  DATA:  Fall with right wrist pain EXAM: RIGHT WRIST - COMPLETE 3+ VIEW COMPARISON:  None. FINDINGS: There is prominent soft tissue swelling in the radial distal right forearm. No fracture, dislocation or suspicious focal osseous lesion. Severe osteoarthritis in the scaphoid- trapezium, first carpometacarpal and first metacarpophalangeal joints. Vascular calcifications throughout the soft tissues. IMPRESSION: Prominent soft tissue swelling in the radial distal right forearm. No fracture or dislocation in the right wrist. Polyarticular osteoarthritis as described. Electronically Signed   By: Ilona Sorrel M.D.   On: 07/07/2015 08:22   Dg Hip Unilat With Pelvis 2-3 Views Left  07/07/2015  CLINICAL DATA:  Fall with left hip pain.  Initial encounter. EXAM: DG HIP (WITH OR WITHOUT PELVIS) 2-3V LEFT COMPARISON:  None. FINDINGS: Subcapital left femoral neck fracture with lateral displacement and mild posterior impaction with anterior fracture widening. The left femoral head is located. No notable degenerative changes to the acetabulum. The right hip  is intact and located. No evidence of pelvic ring fracture or diastasis. Osteopenia and extensive atherosclerosis. IMPRESSION: Subcapital left femoral neck fracture. Electronically Signed   By: Monte Fantasia M.D.   On: 07/07/2015 07:47    ASSESSMENT:  Left hip femoral neck fracture   PLAN:  Needs hemi hip in hopes of sitting and getting OOB.  Marginal walker with a walker prior to this fall so may never walk again.  Will likely need rehab stay postop.  Plan on surgery tomorrow.  Hoping for spinal but up to anesthesia.  Reviewed risks with patient and his wife.  Ann Groeneveld G 07/07/2015, 10:02 PM

## 2015-07-08 NOTE — Progress Notes (Signed)
Pt is ready to return to [facility]. Waiting for RN to call back for Report. Report to L. Doy Mince, Therapist, sports (PACU)

## 2015-07-08 NOTE — Anesthesia Postprocedure Evaluation (Signed)
Anesthesia Post Note  Patient: [name]  Procedure(s) Performed: Procedure(s) (LRB): LEFT  HIP HEMIARTHROPLASTY (Left)  Patient location during evaluation: PACU Anesthesia Type: General Level of consciousness: awake Pain management: pain level controlled Vital Signs Assessment: post-procedure vital signs reviewed and stable Respiratory status: spontaneous breathing and patient connected to nasal cannula oxygen Cardiovascular status: stable Postop Assessment: no signs of nausea or vomiting Anesthetic complications: no    Last Vitals:  Filed Vitals:   07/08/15 1215 07/08/15 1246  BP: 111/47 132/49  Pulse: 46 53  Temp: 36.2 C 36.9 C  Resp: 15 18    Last Pain:  Filed Vitals:   07/08/15 1330  PainSc: 3                  Dominiqua Cooner

## 2015-07-08 NOTE — Progress Notes (Signed)
Report called to nurse in Short Stay. Will continue to monitor

## 2015-07-09 ENCOUNTER — Encounter (HOSPITAL_COMMUNITY): Payer: Self-pay | Admitting: Cardiology

## 2015-07-09 DIAGNOSIS — S72002A Fracture of unspecified part of neck of left femur, initial encounter for closed fracture: Secondary | ICD-10-CM

## 2015-07-09 DIAGNOSIS — E119 Type 2 diabetes mellitus without complications: Secondary | ICD-10-CM

## 2015-07-09 DIAGNOSIS — I5022 Chronic systolic (congestive) heart failure: Secondary | ICD-10-CM

## 2015-07-09 DIAGNOSIS — D500 Iron deficiency anemia secondary to blood loss (chronic): Secondary | ICD-10-CM

## 2015-07-09 LAB — BASIC METABOLIC PANEL
ANION GAP: 7 (ref 5–15)
BUN: 57 mg/dL — ABNORMAL HIGH (ref 6–20)
CO2: 29 mmol/L (ref 22–32)
Calcium: 8.4 mg/dL — ABNORMAL LOW (ref 8.9–10.3)
Chloride: 103 mmol/L (ref 101–111)
Creatinine, Ser: 2.62 mg/dL — ABNORMAL HIGH (ref 0.61–1.24)
GFR calc Af Amer: 23 mL/min — ABNORMAL LOW (ref >60)
GFR calc non Af Amer: 20 mL/min — ABNORMAL LOW (ref >60)
GLUCOSE: 168 mg/dL — AB (ref 65–99)
POTASSIUM: 4.8 mmol/L (ref 3.5–5.1)
Sodium: 139 mmol/L (ref 135–145)

## 2015-07-09 LAB — CBC
HEMATOCRIT: 30.4 % — AB (ref 39.0–52.0)
Hemoglobin: 9.6 g/dL — ABNORMAL LOW (ref 13.0–17.0)
MCH: 29.2 pg (ref 26.0–34.0)
MCHC: 31.6 g/dL (ref 30.0–36.0)
MCV: 92.4 fL (ref 78.0–100.0)
Platelets: 121 10*3/uL — ABNORMAL LOW (ref 150–400)
RBC: 3.29 MIL/uL — AB (ref 4.22–5.81)
RDW: 15.1 % (ref 11.5–15.5)
WBC: 11.5 10*3/uL — AB (ref 4.0–10.5)

## 2015-07-09 LAB — GLUCOSE, CAPILLARY
GLUCOSE-CAPILLARY: 153 mg/dL — AB (ref 65–99)
Glucose-Capillary: 135 mg/dL — ABNORMAL HIGH (ref 65–99)
Glucose-Capillary: 199 mg/dL — ABNORMAL HIGH (ref 65–99)

## 2015-07-09 MED ORDER — OXYCODONE-ACETAMINOPHEN 5-325 MG PO TABS: 1.0000 | ORAL_TABLET | Freq: Four times a day (QID) | ORAL | Status: DC | PRN

## 2015-07-09 MED ORDER — ASPIRIN 325 MG PO TBEC
325.0000 mg | DELAYED_RELEASE_TABLET | Freq: Two times a day (BID) | ORAL | Status: DC
Start: 2015-07-09 — End: 2015-07-11

## 2015-07-09 NOTE — Progress Notes (Addendum)
Subjective: 1 Day Post-Op Procedure(s) (LRB): LEFT  HIP HEMIARTHROPLASTY (Left)   Patient is resting comfortably in bed in no obvious distress. He appears a little somnolent and confused.     Activity level:  Weight bearing as tolerated with posterior hip precautions Diet tolerance:  ok Voiding:  ok Patient reports pain as mild.    Objective: Vital signs in last 24 hours: Temp:  [97.2 F (36.2 C)-99.4 F (37.4 C)] 99.4 F (37.4 C) (01/18 0623) Pulse Rate:  [45-66] 66 (01/18 0623) Resp:  [15-19] 19 (01/18 0623) BP: (110-132)/(47-88) 117/49 mmHg (01/18 0623) SpO2:  [92 %-97 %] 93 % (01/18 0623)  Labs:  Recent Labs  07/07/15 0741 07/07/15 1121  HGB 11.0* 11.8*    Recent Labs  07/07/15 0741 07/07/15 1121  WBC 7.5 14.3*  RBC 3.75* 4.03*  HCT 33.6* 37.1*  PLT 144* 151    Recent Labs  07/07/15 0741 07/07/15 1121  NA 140  --   K 4.6  --   CL 99*  --   CO2 27  --   BUN 50*  --   CREATININE 2.14* 2.06*  GLUCOSE 168*  --   CALCIUM 8.8*  --    No results for input(s): LABPT, INR in the last 72 hours.  Physical Exam:  Neurologically intact ABD soft Neurovascular intact Sensation intact distally Intact pulses distally Dorsiflexion/Plantar flexion intact Incision: dressing C/D/I and scant drainage No cellulitis present Compartment soft  Assessment/Plan:  1 Day Post-Op Procedure(s) (LRB): LEFT  HIP HEMIARTHROPLASTY (Left) Advance diet Up with therapy  Continue on ASA 325mg  BID x 4 weeks for DVT prevention. Continue current pain meds but keep a close on on patients alertness level. Follow up in office 2 weeks post op. We greatly appreciate medical management. We will determine discharge placement by the recommendation of PT. Weightbearing as tolerated with posterior hip precautions.     Rhilyn Battle, Larwance Sachs 07/09/2015, 7:39 AM

## 2015-07-09 NOTE — Progress Notes (Signed)
Subjective:  Somnolent.  No c/o chest pain or SOB.    Objective:  Vital Signs in the last 24 hours: BP 114/57 mmHg  Pulse 64  Temp(Src) 99.4 F (37.4 C) (Oral)  Resp 19  SpO2 93%  Physical Exam: Somnolent elderly WM in NAD Lungs:  Reduced BS Cardiac:  Regular rhythm, normal S1 and S2, no S3, 2/6 sys murmur Extremities:  No edema present, reduced pulses  Intake/Output from previous day: 01/17 0701 - 01/18 0700 In: 1160 [P.O.:600; I.V.:460; IV Piggyback:100] Out: 1350 [Urine:1300; Blood:50]   Lab Results: Basic Metabolic Panel:  Recent Labs  07/07/15 0741 07/07/15 1121 07/09/15 0700  NA 140  --  139  K 4.6  --  4.8  CL 99*  --  103  CO2 27  --  29  GLUCOSE 168*  --  168*  BUN 50*  --  57*  CREATININE 2.14* 2.06* 2.62*   CBC:  Recent Labs  07/07/15 0741 07/07/15 1121 07/09/15 0700  WBC 7.5 14.3* 11.5*  NEUTROABS 5.7  --   --   HGB 11.0* 11.8* 9.6*  HCT 33.6* 37.1* 30.4*  MCV 89.6 92.1 92.4  PLT 144* 151 121*   Telemetry: Sinus with frequent PAC's  Assessment/Plan:  1. Stable following hip surgery with no apparent cardiac comps 2. History of CAD 3. Stage 4 CKD some worsening post surgery and will need to follow 4. Renovascular hypertension 5. Anemia due to blood loss  Rec:  Stable cardiac wise at present.  Continue currrent treatment.       Kerry Hough  MD [facility]  07/09/2015, 1:07 PM

## 2015-07-09 NOTE — Progress Notes (Signed)
PT Cancellation Note  Patient Details Name: [name] MRN: [id number] DOB: 1920-09-10   Cancelled Treatment:    Reason Eval/Treat Not Completed: Fatigue/lethargy limiting ability to participate. Will attempt to complete evaluation as time allows.    Cassell Clement, PT, CSCS Pager [phone] Office [phone]  07/09/2015, 12:20 PM

## 2015-07-09 NOTE — Evaluation (Signed)
Physical Therapy Evaluation Patient Details Name: [name] MRN: [id number] DOB: [birth year] Today's Date: 07/09/2015   History of Present Illness  82 y.o. male with broken hip now s/p  Lt hip hemiarthroplasty on 07/08/15 who fell in bathroom at home. PMH: CKD, diabetes, MI, hypertension, PVD, depression, stenosis  Clinical Impression  Patient is s/p above surgery resulting in functional limitations due to the deficits listed below (see PT Problem List).  Patient will benefit from skilled PT to increase their independence and safety with mobility to allow discharge to the venue listed below.  At this time the patient is total assist +2 for all bed mobility. Recommending SNF following acute stay.      Follow Up Recommendations SNF;Supervision/Assistance - 24 hour    Equipment Recommendations  Other (comment) (to be assessed)    Recommendations for Other Services       Precautions / Restrictions Precautions Precautions: Fall;Posterior Hip Precaution Booklet Issued: Yes (comment) Precaution Comments: posterior hip precautions posted and reviewed with spouse Required Braces or Orthoses: Knee Immobilizer - Left Restrictions Weight Bearing Restrictions: Yes LLE Weight Bearing: Weight bearing as tolerated      Mobility  Bed Mobility Overal bed mobility: +2 for physical assistance;Needs Assistance Bed Mobility: Supine to Sit;Sit to Supine     Supine to sit: +2 for physical assistance;Total assist Sit to supine: Total assist;+2 for physical assistance   General bed mobility comments: Patient not assisting with mobility during session. Total assist needed with transfer and balance.   Transfers                    Ambulation/Gait                Stairs            Wheelchair Mobility    Modified Rankin (Stroke Patients Only)       Balance Overall balance assessment: Needs assistance Sitting-balance support: Feet supported Sitting balance-Leahy  Scale: Zero Sitting balance - Comments: total assist provided                                     Pertinent Vitals/Pain Pain Assessment: No/denies pain    Home Living Family/patient expects to be discharged to:: Skilled nursing facility Living Arrangements: Spouse/significant other             Home Equipment: Walker - 4 wheels;Wheelchair - manual Additional Comments: Details provided per patient's spouse as patient not responding to questions.     Prior Function Level of Independence: Needs assistance   Gait / Transfers Assistance Needed: ambulating in home with rollator, w/c outside home.  ADL's / Homemaking Assistance Needed: bathing, dressing, grooming provided by spouse        Hand Dominance        Extremity/Trunk Assessment   Upper Extremity Assessment: Generalized weakness           Lower Extremity Assessment: Generalized weakness         Communication   Communication: Other (comment) (minimal communication by patient)  Cognition Arousal/Alertness: Lethargic Behavior During Therapy: Flat affect (not consistently responding to commands) Overall Cognitive Status: History of cognitive impairments - at baseline (per spouse)                      General Comments      Exercises        Assessment/Plan  PT Assessment Patient needs continued PT services  PT Diagnosis Difficulty walking;Generalized weakness   PT Problem List Decreased strength;Decreased range of motion;Decreased activity tolerance;Decreased balance;Decreased mobility  PT Treatment Interventions DME instruction;Gait training;Stair training;Functional mobility training;Therapeutic activities;Therapeutic exercise;Balance training;Patient/family education   PT Goals (Current goals can be found in the Care Plan section) Acute Rehab PT Goals Patient Stated Goal: not expressed PT Goal Formulation: With patient/family Time For Goal Achievement: 07/23/15 Potential  to Achieve Goals: Fair    Frequency Min 3X/week   Barriers to discharge        Co-evaluation               End of Session Equipment Utilized During Treatment: Left knee immobilizer Activity Tolerance: Patient limited by lethargy Patient left: in bed;with call bell/phone within reach;with bed alarm set;with family/visitor present;with SCD's reapplied Nurse Communication: Mobility status         Time: 1335-1401 PT Time Calculation (min) (ACUTE ONLY): 26 min   Charges:   PT Evaluation $PT Eval Moderate Complexity: 1 Procedure PT Treatments $Therapeutic Activity: 8-22 mins   PT G Codes:        Cassell Clement, PT, CSCS Pager [phone] Office [phone]  07/09/2015, 2:52 PM

## 2015-07-09 NOTE — Progress Notes (Signed)
PROGRESS NOTE    [name] [id number] DOB: [birth year] DOA: 07/07/2015 PCP: Horatio Pel, MD  HPI/Brief narrative 82 year old male, ambulates with the help of a walker, with history of DM 2 with peripheral neuropathy, HTN, CAD, MI, chronic CHF (unknown type-no echo in system), PAD, dyslipidemia, GERD, RAS status post bilateral stents, stage III chronic kidney disease, hard of hearing, initially presented to [facility] ED on 07/07/15 following mechanical fall at home while trying to get out of bed. Imaging revealed subcapital left femoral neck fracture. Orthopedics was consulted and recommended transferring to [facility] for surgery on 1/17.  Assessment/Plan:   Left femoral neck fracture, status post hemiarthroplasty - Orthopedics was consulted and recommended transferring patient from [facility] to [facility] for surgery on 1/17. - Patient underwent left hip hemiarthroplasty on 1/17. Management per orthopedics. - continue with as needed pain medicine, will monitor closely given his somnolence.  Chronic CHF, unknown type - No echo in system. Follow 2-D echo that has been ordered. - Clinically compensated. Lasix currently on hold specially in the setting of increased creatinine, monitor closely as an IV fluid  PAD - Status post femoropopliteal bypass in the past. No symptoms suggestive of claudication. Follows with Dr. Donnetta Hutching  CAD - Asymptomatic of chest pain  Acute on chronic kidney disease Stage III  - Baseline creatinine said to be in the 1.4-1.5. Initially presented with creatinine of 2.14. Lasix and ARB temporarily held. Follow BMP in a.m. - Under the care of Dr. Lorrene Reid as outpatient. - creatinine is 2.6 today, will start on IV fluids as having poor  Po intake.  Type II DM with peripheral neuropathy - Last A1c: 5.4. Continue SSI. Controlled in the hospital.  Essential hypertension - Controlled. Continue Coreg. They are be on hold.  Acute urinary retention/BPH - Foley catheter placed in ED  due to urinary retention. He has apparently needed home Foley catheter in the past. Continue doxazosin. - foley catheter discontinued today, monitor for attention.  Constipation - Bowel regimen.  Anemia of chronic disease/chronic kidney disease - Stable. Follow CBC in a.m.  Right mid lung opacity on chest x-ray - Cannot exclude pulmonary nodule. Outpatient evaluation as & if deemed necessary.   DVT prophylaxis: Lovenox Code Status: Full Family Communication: Discussed with patient's spouse  at bedside. Disposition Plan: DC to SNF when medically stable.   Consultants:  Orthopedics  Procedures:  Foley catheter  Left hip hemiarthroplasty 07/08/15  Antimicrobials:  None   Subjective: Patient denies any complaints, appears somnolent. Denies pain. No chest pain or dyspnea.  Objective: Filed Vitals:   07/08/15 1246 07/08/15 2029 07/09/15 0623 07/09/15 1005  BP: 132/49 120/88 117/49 114/57  Pulse: 53 66 66 64  Temp: 98.4 F (36.9 C) 98.5 F (36.9 C) 99.4 F (37.4 C)   TempSrc:  Oral Oral   Resp: 18 17 19    SpO2: 93% 97% 93%     Intake/Output Summary (Last 24 hours) at 07/09/15 1346 Last data filed at 07/09/15 0626  Gross per 24 hour  Intake    330 ml  Output    550 ml  Net   -220 ml   There were no vitals filed for this visit.  Exam:  General exam: Pleasant elderly male lying comfortably propped up in bed. Respiratory system: Slightly diminished breath sounds in the bases/poor inspiratory effort but otherwise clear to auscultation. No increased work of breathing. Cardiovascular system: S1 & S2 heard, RRR. No JVD, murmurs, gallops, clicks or pedal edema. Gastrointestinal system: Abdomen is  nondistended/obese, soft and nontender. Normal bowel sounds heard. Only catheter +. Central nervous system: Alert and oriented. No focal neurological deficits.mildly somnolant. Extremities: Symmetric 5 x 5 power. Left hip surgical site dressing clean and dry. Ice pack on top.  Left lower extremity in immobilizer.   Data Reviewed: Basic Metabolic Panel:  Recent Labs Lab 07/07/15 0741 07/07/15 1121 07/09/15 0700  NA 140  --  139  K 4.6  --  4.8  CL 99*  --  103  CO2 27  --  29  GLUCOSE 168*  --  168*  BUN 50*  --  57*  CREATININE 2.14* 2.06* 2.62*  CALCIUM 8.8*  --  8.4*  MG  --  3.0*  --    Liver Function Tests:  Recent Labs Lab 07/07/15 1121  AST 26  ALT 18  ALKPHOS 72  BILITOT 0.8  PROT 7.4  ALBUMIN 4.1   No results for input(s): LIPASE, AMYLASE in the last 168 hours. No results for input(s): AMMONIA in the last 168 hours. CBC:  Recent Labs Lab 07/07/15 0741 07/07/15 1121 07/09/15 0700  WBC 7.5 14.3* 11.5*  NEUTROABS 5.7  --   --   HGB 11.0* 11.8* 9.6*  HCT 33.6* 37.1* 30.4*  MCV 89.6 92.1 92.4  PLT 144* 151 121*   Cardiac Enzymes: No results for input(s): CKTOTAL, CKMB, CKMBINDEX, TROPONINI in the last 168 hours. BNP (last 3 results) No results for input(s): PROBNP in the last 8760 hours. CBG:  Recent Labs Lab 07/08/15 1140 07/08/15 1252 07/08/15 1650 07/08/15 2111 07/09/15 0700  GLUCAP 145* 152* 169* 161* 135*    Recent Results (from the past 240 hour(s))  Surgical pcr screen     Status: Abnormal   Collection Time: 07/08/15  4:20 AM  Result Value Ref Range Status   MRSA, PCR POSITIVE (A) NEGATIVE Final   Staphylococcus aureus POSITIVE (A) NEGATIVE Final    Comment:        The Xpert SA Assay (FDA approved for NASAL specimens in patients over 50 years of age), is one component of a comprehensive surveillance program.  Test performance has been validated by [facility] for patients greater than or equal to 68 year old. It is not intended to diagnose infection nor to guide or monitor treatment. RESULT CALLED TO, READ BACK BY AND VERIFIED WITH: @ 7:52 ON 001/17/2017 BY [email or web address]          Studies: Pelvis Portable  07/08/2015  CLINICAL DATA:  Left hip replacement today for subcapital fracture. Initial  encounter. EXAM: PORTABLE PELVIS 1-2 VIEWS COMPARISON:  Plain films left hip 07/07/2015. FINDINGS: New left hip arthroplasty is in place. The device is located. There is no fracture. Surgical staples are noted. No acute abnormality. IMPRESSION: Status post left hip replacement without evidence of complication. Electronically Signed   By: Inge Rise M.D.   On: 07/08/2015 14:10        Scheduled Meds: . aspirin EC  325 mg Oral BID AC & HS  . atorvastatin  20 mg Oral Daily  . carvedilol  25 mg Oral BID WC  . docusate sodium  100 mg Oral BID  . doxazosin  4 mg Oral BID  . enoxaparin (LOVENOX) injection  30 mg Subcutaneous Q24H  . ferrous sulfate  325 mg Oral Q breakfast  . finasteride  5 mg Oral Daily  . LORazepam  0.5 mg Intravenous Once  . mirtazapine  15 mg Oral QHS  . polyethylene glycol  17 g  Oral QHS  . senna  1 tablet Oral QID  . sertraline  25 mg Oral Daily  . sodium chloride  3 mL Intravenous Q12H  . vitamin B-12  1,000 mcg Oral QAC breakfast  . vitamin C  500 mg Oral QAC breakfast   Continuous Infusions: . sodium chloride 10 mL/hr at 07/08/15 1733    Active Problems:   Bursitis of right hip   CHF (congestive heart failure) (HCC)   PVD (peripheral vascular disease) (HCC)   CAD (coronary artery disease)   CKD (chronic kidney disease) stage 3, GFR 30-59 ml/min   Diabetes mellitus type II, controlled ([place])   HTN (hypertension)   Anemia   Diabetic polyneuropathy (HCC)   Hip fracture ([place])    Time spent: 25 minutes.    Phillips Climes, MD. [facility] Pager [phone].  If 7PM-7AM, please contact night-coverage [email or web address] Password [email or web address] 07/09/2015, 1:46 PM    LOS: 2 days

## 2015-07-10 DIAGNOSIS — I10 Essential (primary) hypertension: Secondary | ICD-10-CM

## 2015-07-10 DIAGNOSIS — N183 Chronic kidney disease, stage 3 (moderate): Secondary | ICD-10-CM

## 2015-07-10 LAB — GLUCOSE, CAPILLARY
GLUCOSE-CAPILLARY: 163 mg/dL — AB (ref 65–99)
Glucose-Capillary: 154 mg/dL — ABNORMAL HIGH (ref 65–99)
Glucose-Capillary: 153 mg/dL — ABNORMAL HIGH (ref 65–99)
Glucose-Capillary: 183 mg/dL — ABNORMAL HIGH (ref 65–99)
Glucose-Capillary: 164 mg/dL — ABNORMAL HIGH (ref 65–99)

## 2015-07-10 LAB — CBC
HEMATOCRIT: 26.6 % — AB (ref 39.0–52.0)
Hemoglobin: 8.7 g/dL — ABNORMAL LOW (ref 13.0–17.0)
MCH: 30.2 pg (ref 26.0–34.0)
MCHC: 32.7 g/dL (ref 30.0–36.0)
MCV: 92.4 fL (ref 78.0–100.0)
PLATELETS: 102 10*3/uL — AB (ref 150–400)
RBC: 2.88 MIL/uL — ABNORMAL LOW (ref 4.22–5.81)
RDW: 15 % (ref 11.5–15.5)
WBC: 10.2 10*3/uL (ref 4.0–10.5)

## 2015-07-10 LAB — BASIC METABOLIC PANEL
ANION GAP: 12 (ref 5–15)
BUN: 63 mg/dL — ABNORMAL HIGH (ref 6–20)
CALCIUM: 8.4 mg/dL — AB (ref 8.9–10.3)
CO2: 26 mmol/L (ref 22–32)
CREATININE: 2.37 mg/dL — AB (ref 0.61–1.24)
Chloride: 103 mmol/L (ref 101–111)
GFR, EST AFRICAN AMERICAN: 26 mL/min — AB (ref >60)
GFR, EST NON AFRICAN AMERICAN: 22 mL/min — AB (ref >60)
Glucose, Bld: 150 mg/dL — ABNORMAL HIGH (ref 65–99)
Potassium: 4.6 mmol/L (ref 3.5–5.1)
SODIUM: 141 mmol/L (ref 135–145)

## 2015-07-10 MED ORDER — CARVEDILOL 12.5 MG PO TABS
12.5000 mg | ORAL_TABLET | Freq: Two times a day (BID) | ORAL | Status: DC
  Administered 2015-07-10 – 2015-07-11 (×2): 12.5 mg via ORAL
  Filled 2015-07-10 (×2): qty 1

## 2015-07-10 MED ORDER — CHLORHEXIDINE GLUCONATE CLOTH 2 % EX PADS
6.0000 | MEDICATED_PAD | Freq: Every day | CUTANEOUS | Status: DC
  Administered 2015-07-10 – 2015-07-11 (×2): 6 via TOPICAL

## 2015-07-10 MED ORDER — MUPIROCIN 2 % EX OINT
1.0000 "application " | TOPICAL_OINTMENT | Freq: Two times a day (BID) | CUTANEOUS | Status: DC
  Administered 2015-07-10 – 2015-07-11 (×3): 1 via NASAL
  Filled 2015-07-10: qty 22

## 2015-07-10 NOTE — Evaluation (Signed)
Occupational Therapy Evaluation Patient Details Name: [name] MRN: [id number] DOB: January 07, 1921 Today's Date: 07/10/2015    History of Present Illness 82 y.o. male with broken hip now s/p  Lt hip hemiarthroplasty on 07/08/15 who fell in bathroom at home. PMH: CKD, diabetes, MI, hypertension, PVD, depression, stenosis   Clinical Impression   Pts wife reports that the pt required assist with bathing, dressing, and grooming PTA. Currently pt is total assist for ADLs and functional mobility. Attempted rolling in bed but pt was unable to follow commands to assist with bed mobility. Pt disoriented to place, time, and situation. Wife reports that pt has a history of cognitive impairments at baseline, however in her opinion his cognition has gotten worse since he fell. Pt planning to d/c to SNF for further rehab; agree with SNF placement. All further OT needs can be met at the next venue of care; will defer OT to SNF. Please re-consult if change in medical status occurs. Thank you for this referral.     Follow Up Recommendations  SNF;Supervision/Assistance - 24 hour    Equipment Recommendations  Other (comment) (TBD at next venue)    Recommendations for Other Services       Precautions / Restrictions Precautions Precautions: Fall;Posterior Hip Required Braces or Orthoses: Knee Immobilizer - Left Restrictions Weight Bearing Restrictions: Yes LLE Weight Bearing: Weight bearing as tolerated      Mobility Bed Mobility Overal bed mobility: Needs Assistance;+2 for physical assistance Bed Mobility: Rolling           General bed mobility comments: Attempted rolling for repositioning; pt unable to assist or follow commands to assist.  Transfers                      Balance                                            ADL Overall ADL's : Needs assistance/impaired                                       General ADL Comments: Pt currenlty  total assist for ADLs at bed level and functional mobility at this time. Attempted bed mobility; pt unable to follow simple one step commands.      Vision     Perception     Praxis      Pertinent Vitals/Pain Pain Assessment: No/denies pain     Hand Dominance Right   Extremity/Trunk Assessment Upper Extremity Assessment Upper Extremity Assessment: Generalized weakness   Lower Extremity Assessment Lower Extremity Assessment: Defer to PT evaluation       Communication Communication Communication: Other (comment) (limited communication by pt)   Cognition Arousal/Alertness: Lethargic Behavior During Therapy: Flat affect Overall Cognitive Status: Impaired/Different from baseline Area of Impairment: Orientation;Following commands Orientation Level: Disoriented to;Time;Situation;Place   Memory: Decreased short-term memory Following Commands: Follows one step commands inconsistently       General Comments: Spouse reports history of cognitive impariments at baseline, however, she believes he is cognitively worse since fall.   General Comments       Exercises       Shoulder Instructions      Home Living Family/patient expects to be discharged to:: Skilled nursing facility Living Arrangements: Spouse/significant other  Home Equipment: [place] - 4 wheels;Wheelchair - [employer];Shower seat          Prior Functioning/Environment Level of Independence: Needs assistance  Gait / Transfers Assistance Needed: ambulating in home with rollator, w/c outside home. ADL's / Homemaking Assistance Needed: bathing, dressing, grooming provided by spouse Communication / Swallowing Assistance Needed: patient inconsistently responding to questions, opening eyes to name.       OT Diagnosis: Generalized weakness;Cognitive deficits;Acute pain;Altered mental status   OT Problem List:     OT Treatment/Interventions:      OT  Goals(Current goals can be found in the care plan section) Acute Rehab OT Goals Patient Stated Goal: not expressed  OT Frequency:     Barriers to D/C:            Co-evaluation              End of Session    Activity Tolerance: Patient limited by lethargy Patient left: in bed;with call bell/phone within reach;with nursing/sitter in room;with family/visitor present   Time: 7517-0017 OT Time Calculation (min): 12 min Charges:  OT General Charges $OT Visit: 1 Procedure OT Evaluation $OT Eval Moderate Complexity: 1 Procedure G-Codes:     Binnie Kand M.S., OTR/L Pager: [phone]  07/10/2015, 2:30 PM

## 2015-07-10 NOTE — Progress Notes (Signed)
PROGRESS NOTE    [name] [id number] DOB: 26-Mar-1921 DOA: 07/07/2015 PCP: Horatio Pel, MD  HPI/Brief narrative 82 year old male, ambulates with the help of a walker, with history of DM 2 with peripheral neuropathy, HTN, CAD, MI, chronic CHF (unknown type-no echo in system), PAD, dyslipidemia, GERD, RAS status post bilateral stents, stage III chronic kidney disease, hard of hearing, initially presented to [facility] ED on 07/07/15 following mechanical fall at home while trying to get out of bed. Imaging revealed subcapital left femoral neck fracture. Orthopedics was consulted and recommended transferring to [facility] for surgery on 1/17. Patient underwent left hip hemiarthroplasty on 1/17.  Assessment/Plan:   Left femoral neck fracture, status post hemiarthroplasty - Orthopedics was consulted and recommended transferring patient from [facility] to [facility] for surgery on 1/17. - Patient underwent left hip hemiarthroplasty on 1/17. Management per orthopedics. - continue with as needed pain medicine. - on Aspirin for DVT prophylaxis.  Chronic CHF, unknown type - No echo in system.Prior LV function has been normal.   - Clinically compensated. Lasix currently on hold specially in the setting of increased creatinine, monitor closely as an IV fluid  PAD - Status post femoropopliteal bypass in the past. No symptoms suggestive of claudication. Follows with Dr. Donnetta Hutching  CAD - Asymptomatic of chest pain  Acute on chronic kidney disease Stage III  - Baseline creatinine said to be in the 1.4-1.5. Initially presented with creatinine of 2.14. Lasix and ARB temporarily held.  - Under the care of Dr. Lorrene Reid as outpatient. - creatinine peaked at  2.6 on  07/09/2015, improving  on IV fluid.  Type II DM with peripheral neuropathy - Last A1c: 5.4. Continue SSI. Controlled in the hospital.  Essential hypertension - Controlled. Continue Coreg, will decrease dose given her crit on the lower side.  Acute urinary  retention/BPH - Foley catheter placed in ED due to urinary retention. He has apparently needed home Foley catheter in the past. Continue doxazosin. - foley catheter discontinued 07/09/2015, evidence of urinary tension today, attempt in and out one time, if recurrence will reinsert Foley catheter.  Constipation - Bowel regimen.  Anemia of chronic disease/chronic kidney disease - Stable. Follow CBC in a.m.  Right mid lung opacity on chest x-ray - Cannot exclude pulmonary nodule. Outpatient evaluation as & if deemed necessary.   DVT prophylaxis: Lovenox changed to aspirin. Code Status: Full Family Communication: Discussed with patient's spouse  at bedside. Disposition Plan: DC to SNF in 24 hours if remains stable..   Consultants:  Orthopedics  Procedures:  Foley catheter  Left hip hemiarthroplasty 07/08/15  Antimicrobials:  None   Subjective: Patient denies any complaints. Denies pain. No chest pain or dyspnea.  Objective: Filed Vitals:   07/09/15 1542 07/09/15 2037 07/10/15 0200 07/10/15 0619  BP: 116/44 123/50  125/52  Pulse: 61 59  52  Temp: 98.9 F (37.2 C) 97.7 F (36.5 C)  98.6 F (37 C)  TempSrc: Oral Oral    Resp: 18 17  18   Height:   5' 9.5" (1.765 m)   SpO2: 90% 97%  98%    Intake/Output Summary (Last 24 hours) at 07/10/15 1312 Last data filed at 07/10/15 0846  Gross per 24 hour  Intake    240 ml  Output    550 ml  Net   -310 ml   There were no vitals filed for this visit.  Exam:  General exam: Pleasant elderly male lying comfortably propped up in bed. Respiratory system: Slightly diminished breath sounds  in the bases/poor inspiratory effort but otherwise clear to auscultation. No increased work of breathing. Cardiovascular system: S1 & S2 heard, RRR. No JVD, murmurs, gallops, clicks or pedal edema. Gastrointestinal system: Abdomen is nondistended/obese, soft and nontender. Normal bowel sounds heard. Only catheter +. Central nervous system:  Alert and oriented. No focal neurological deficits.mildly somnolant. Extremities: Symmetric 5 x 5 power. Left hip surgical site dressing clean and dry. Left lower extremity in immobilizer.   Data Reviewed: Basic Metabolic Panel:  Recent Labs Lab 07/07/15 0741 07/07/15 1121 07/09/15 0700 07/10/15 0649  NA 140  --  139 141  K 4.6  --  4.8 4.6  CL 99*  --  103 103  CO2 27  --  29 26  GLUCOSE 168*  --  168* 150*  BUN 50*  --  57* 63*  CREATININE 2.14* 2.06* 2.62* 2.37*  CALCIUM 8.8*  --  8.4* 8.4*  MG  --  3.0*  --   --    Liver Function Tests:  Recent Labs Lab 07/07/15 1121  AST 26  ALT 18  ALKPHOS 72  BILITOT 0.8  PROT 7.4  ALBUMIN 4.1   No results for input(s): LIPASE, AMYLASE in the last 168 hours. No results for input(s): AMMONIA in the last 168 hours. CBC:  Recent Labs Lab 07/07/15 0741 07/07/15 1121 07/09/15 0700 07/10/15 0649  WBC 7.5 14.3* 11.5* 10.2  NEUTROABS 5.7  --   --   --   HGB 11.0* 11.8* 9.6* 8.7*  HCT 33.6* 37.1* 30.4* 26.6*  MCV 89.6 92.1 92.4 92.4  PLT 144* 151 121* 102*   Cardiac Enzymes: No results for input(s): CKTOTAL, CKMB, CKMBINDEX, TROPONINI in the last 168 hours. BNP (last 3 results) No results for input(s): PROBNP in the last 8760 hours. CBG:  Recent Labs Lab 07/09/15 1748 07/09/15 2136 07/10/15 0652 07/10/15 0804 07/10/15 1133  GLUCAP 199* 153* 154* 153* 183*    Recent Results (from the past 240 hour(s))  Surgical pcr screen     Status: Abnormal   Collection Time: 07/08/15  4:20 AM  Result Value Ref Range Status   MRSA, PCR POSITIVE (A) NEGATIVE Final   Staphylococcus aureus POSITIVE (A) NEGATIVE Final    Comment:        The Xpert SA Assay (FDA approved for NASAL specimens in patients over 12 years of age), is one component of a comprehensive surveillance program.  Test performance has been validated by [facility] for patients greater than or equal to 20 year old. It is not intended to diagnose infection  nor to guide or monitor treatment. RESULT CALLED TO, READ BACK BY AND VERIFIED WITH: @ 7:52 ON 001/17/2017 BY [email or web address]          Studies: Pelvis Portable  07/08/2015  CLINICAL DATA:  Left hip replacement today for subcapital fracture. Initial encounter. EXAM: PORTABLE PELVIS 1-2 VIEWS COMPARISON:  Plain films left hip 07/07/2015. FINDINGS: New left hip arthroplasty is in place. The device is located. There is no fracture. Surgical staples are noted. No acute abnormality. IMPRESSION: Status post left hip replacement without evidence of complication. Electronically Signed   By: Inge Rise M.D.   On: 07/08/2015 14:10        Scheduled Meds: . aspirin EC  325 mg Oral BID AC & HS  . atorvastatin  20 mg Oral Daily  . carvedilol  25 mg Oral BID WC  . Chlorhexidine Gluconate Cloth  6 each Topical Q0600  . docusate sodium  100 mg Oral BID  . doxazosin  4 mg Oral BID  . enoxaparin (LOVENOX) injection  30 mg Subcutaneous Q24H  . ferrous sulfate  325 mg Oral Q breakfast  . finasteride  5 mg Oral Daily  . LORazepam  0.5 mg Intravenous Once  . mirtazapine  15 mg Oral QHS  . mupirocin ointment  1 application Nasal BID  . polyethylene glycol  17 g Oral QHS  . senna  1 tablet Oral QID  . sertraline  25 mg Oral Daily  . sodium chloride  3 mL Intravenous Q12H  . vitamin B-12  1,000 mcg Oral QAC breakfast  . vitamin C  500 mg Oral QAC breakfast   Continuous Infusions: . sodium chloride 75 mL/hr at 07/10/15 1144    Active Problems:   Bursitis of right hip   PVD (peripheral vascular disease) (HCC)   CAD (coronary artery disease)   CKD (chronic kidney disease) stage 3, GFR 30-59 ml/min   Diabetes mellitus type II, controlled ([place])   HTN (hypertension)   Anemia   Diabetic polyneuropathy (HCC)   Hip fracture ([place])    Time spent: 25 minutes.    Phillips Climes, MD. [facility] Pager [phone].  If 7PM-7AM, please contact night-coverage [email or web address] Password  [email or web address] 07/10/2015, 1:12 PM    LOS: 3 days

## 2015-07-10 NOTE — Clinical Social Work Placement (Signed)
   CLINICAL SOCIAL WORK PLACEMENT  NOTE  Date:  07/10/2015  Patient Details  Name: [name] MRN: [id number] Date of Birth: 12/03/1920  Clinical Social Work is seeking post-discharge placement for this patient at the [place] level of care (*CSW will initial, date and re-position this form in  chart as items are completed):  Yes   Patient/family provided with [place] Work Department's list of facilities offering this level of care within the geographic area requested by the patient (or if unable, by the patient's family).  Yes   Patient/family informed of their freedom to choose among providers that offer the needed level of care, that participate in Medicare, Medicaid or managed care program needed by the patient, have an available bed and are willing to accept the patient.  Yes   Patient/family informed of [place]'s ownership interest in [facility] and [facility], as well as of the fact that they are under no obligation to receive care at these facilities.  PASRR submitted to EDS on       PASRR number received on       Existing PASRR number confirmed on       FL2 transmitted to all facilities in geographic area requested by pt/family on 07/10/15     FL2 transmitted to all facilities within larger geographic area on       Patient informed that his/her managed care company has contracts with or will negotiate with certain facilities, including the following:            Patient/family informed of bed offers received.  Patient chooses bed at       Physician recommends and patient chooses bed at      Patient to be transferred to   on  .  Patient to be transferred to facility by       Patient family notified on   of transfer.  Name of family member notified:        PHYSICIAN       Additional Comment:    _______________________________________________ Leane Call, Student-SW 07/10/2015, 11:34 AM

## 2015-07-10 NOTE — Progress Notes (Signed)
Subjective:  Much more alert today.  Awake and denies chest pain or SOB.  Objective:  Vital Signs in the last 24 hours: BP 125/52 mmHg  Pulse 52  Temp(Src) 98.6 F (37 C) (Oral)  Resp 18  Ht 5' 9.5" (1.765 m)  SpO2 98%  Physical Exam: Somnolent elderly WM in NAD Lungs:  Reduced BS Cardiac:  Regular rhythm, normal S1 and S2, no S3, 2/6 sys murmur Extremities:  No edema present, reduced pulses  Intake/Output from previous day: 01/18 0701 - 01/19 0700 In: -  Out: 550 [Urine:550]   Lab Results: Basic Metabolic Panel:  Recent Labs  07/09/15 0700 07/10/15 0649  NA 139 141  K 4.8 4.6  CL 103 103  CO2 29 26  GLUCOSE 168* 150*  BUN 57* 63*  CREATININE 2.62* 2.37*   CBC:  Recent Labs  07/09/15 0700 07/10/15 0649  WBC 11.5* 10.2  HGB 9.6* 8.7*  HCT 30.4* 26.6*  MCV 92.4 92.4  PLT 121* PENDING   Telemetry: Sinus with frequent PAC's  Assessment/Plan:  1. Stable following hip surgery with no apparent cardiac comps 2. History of CAD 3. Stage 4 CKD with acute renal failure post surgery.  CR is better today.  4. Renovascular hypertension 5. Anemia due to blood loss  Rec:  Clinically he is not volume overloaded at present.  In NSR. MIld hydration today but be careful not to overload.  Prior LV function has been normal.       W. Doristine Church  MD [facility]  07/10/2015, 9:26 AM

## 2015-07-10 NOTE — NC FL2 (Signed)
[place] LEVEL OF CARE SCREENING TOOL     IDENTIFICATION  Patient Name: [name] Birthdate: Nov 10, 1920 Sex: male Admission Date (Current Location): 07/07/2015  [place] and [place] Number:  Kathleen Argue  [facility]- [id number]) Facility and Address:  [facility], [place] [address] [place], [place] [address]      Provider Number: [id number]  Attending Physician Name and Address:  Albertine Patricia, MD  Relative Name and Phone Number:   [phone]- Barnett Applebaum)    Current Level of Care: Hospital Recommended Level of Care: [place] Prior Approval Number:    Date Approved/Denied:   PASRR Number:  [id number]  Discharge Plan: SNF    Current Diagnoses: Patient Active Problem List   Diagnosis Date Noted  . Hip fracture ([place]) 07/07/2015  . Aftercare following surgery of the circulatory system, [place] 12/12/2012  . Memory deficit 11/29/2012  . Carpal tunnel syndrome 07/18/2012  . Spinal stenosis, lumbar region, without neurogenic claudication 07/18/2012  . Other vitamin B12 deficiency anemia 07/18/2012  . Abnormality of gait 07/18/2012  . Diabetic polyneuropathy ([place]) 07/18/2012  . CHF, acute on chronic ([place]) 05/26/2012  . Leukocytosis 05/25/2012  . Anemia 05/25/2012  . PVD (peripheral vascular disease) ([place]) 05/24/2012  . CAD (coronary artery disease) 05/24/2012  . CKD (chronic kidney disease) stage 3, GFR 30-59 ml/min 05/24/2012  . Diabetes mellitus type II, controlled ([place]) 05/24/2012  . HTN (hypertension) 05/24/2012  . Bursitis of right hip 08/13/2011    Orientation RESPIRATION BLADDER Height & Weight    Self, Time, Situation, Place  Normal Continent 5\' 9"  (175.3 cm) 179 lbs.  BEHAVIORAL SYMPTOMS/MOOD NEUROLOGICAL BOWEL NUTRITION STATUS      Continent Diet (SOFT room service)  AMBULATORY STATUS COMMUNICATION OF NEEDS Skin   Total Care   Surgical wounds (location: hip; left/right)                        Personal Care Assistance Level of Assistance  Total care       Total Care Assistance: Maximum assistance   Functional Limitations Info             SPECIAL CARE FACTORS FREQUENCY  PT (By licensed PT)     PT Frequency:  (3x/week)              Contractures      Additional Factors Info  Code Status, Allergies, Isolation Precautions Code Status Info:  (FULL) Allergies Info:  (amoxicillin, apresoline esidrix,hctz, tetracyclines,levaquin,serzone, biaxin)     Isolation Precautions Info:  (MRSA)     Current Medications (07/10/2015):  This is the current hospital active medication list Current Facility-Administered Medications  Medication Dose Route Frequency Provider Last Rate Last Dose  . 0.9 %  sodium chloride infusion   Intravenous Continuous Albertine Patricia, MD 75 mL/hr at 07/09/15 2149    . acetaminophen (TYLENOL) tablet 650 mg  650 mg Oral Q6H PRN Loni Dolly, PA-C   650 mg at 07/09/15 1213   Or  . acetaminophen (TYLENOL) suppository 650 mg  650 mg Rectal Q6H PRN Loni Dolly, PA-C      . alum & mag hydroxide-simeth (MAALOX/MYLANTA) 200-200-20 MG/5ML suspension 30 mL  30 mL Oral Q6H PRN Reyne Dumas, MD      . aspirin EC tablet 325 mg  325 mg Oral BID AC & HS Loni Dolly, PA-C   325 mg at 07/10/15 0829  . atorvastatin (LIPITOR) tablet 20 mg  20 mg  Oral Daily Reyne Dumas, MD   20 mg at 07/09/15 2151  . carvedilol (COREG) tablet 25 mg  25 mg Oral BID WC Modena Jansky, MD   25 mg at 07/10/15 0823  . Chlorhexidine Gluconate Cloth 2 % PADS 6 each  6 each Topical Q0600 Albertine Patricia, MD   6 each at 07/10/15 0600  . docusate sodium (COLACE) capsule 100 mg  100 mg Oral BID Reyne Dumas, MD   100 mg at 07/09/15 2151  . doxazosin (CARDURA) tablet 4 mg  4 mg Oral BID Reyne Dumas, MD   4 mg at 07/09/15 2151  . enoxaparin (LOVENOX) injection 30 mg  30 mg Subcutaneous Q24H Reyne Dumas, MD   30 mg at 07/09/15 2151  . ferrous sulfate tablet 325 mg  325 mg Oral Q  breakfast Reyne Dumas, MD   325 mg at 07/10/15 0829  . finasteride (PROSCAR) tablet 5 mg  5 mg Oral Daily Reyne Dumas, MD   5 mg at 07/09/15 1003  . HYDROmorphone (DILAUDID) injection 0.5-1 mg  0.5-1 mg Intravenous Q4H PRN Loni Dolly, PA-C   0.5 mg at 07/09/15 [id number]  . levalbuterol (XOPENEX) nebulizer solution 0.63 mg  0.63 mg Nebulization Q6H PRN Reyne Dumas, MD      . LORazepam (ATIVAN) injection 0.5 mg  0.5 mg Intravenous Once Reyne Dumas, MD   Stopped at 07/07/15 1912  . menthol-cetylpyridinium (CEPACOL) lozenge 3 mg  1 lozenge Oral PRN Loni Dolly, PA-C       Or  . phenol (CHLORASEPTIC) mouth spray 1 spray  1 spray Mouth/Throat PRN Loni Dolly, PA-C      . metoCLOPramide (REGLAN) tablet 5-10 mg  5-10 mg Oral Q8H PRN Loni Dolly, PA-C       Or  . metoCLOPramide (REGLAN) injection 5-10 mg  5-10 mg Intravenous Q8H PRN Loni Dolly, PA-C      . mirtazapine (REMERON) tablet 15 mg  15 mg Oral QHS Reyne Dumas, MD   15 mg at 07/09/15 2151  . mupirocin ointment (BACTROBAN) 2 % 1 application  1 application Nasal BID Albertine Patricia, MD      . ondansetron [facility]) tablet 4 mg  4 mg Oral Q6H PRN Loni Dolly, PA-C       Or  . ondansetron (ZOFRAN) injection 4 mg  4 mg Intravenous Q6H PRN Loni Dolly, PA-C      . oxyCODONE-acetaminophen (PERCOCET/ROXICET) 5-325 MG per tablet 1 tablet  1 tablet Oral Q6H PRN Modena Jansky, MD   1 tablet at 07/08/15 1734   And  . oxyCODONE (Oxy IR/ROXICODONE) immediate release tablet 2.5 mg  2.5 mg Oral Q6H PRN Modena Jansky, MD      . polyethylene glycol (MIRALAX / GLYCOLAX) packet 17 g  17 g Oral QHS Reyne Dumas, MD   17 g at 07/09/15 2152  . polyethylene glycol (MIRALAX / GLYCOLAX) packet 17 g  17 g Oral Daily PRN Reyne Dumas, MD      . senna (SENOKOT) tablet 8.6 mg  1 tablet Oral QID Loni Dolly, PA-C   8.6 mg at 07/09/15 2154  . sertraline (ZOLOFT) tablet 25 mg  25 mg Oral Daily Reyne Dumas, MD   25 mg at 07/09/15 1004  . sodium chloride 0.9 %  injection 3 mL  3 mL Intravenous Q12H Reyne Dumas, MD   3 mL at 07/07/15 1029  . vitamin B-12 (CYANOCOBALAMIN) tablet 1,000 mcg  1,000 mcg Oral QAC breakfast Reyne Dumas, MD  1,000 mcg at 07/10/15 [id number]  . vitamin C (ASCORBIC ACID) tablet 500 mg  500 mg Oral QAC breakfast Reyne Dumas, MD   500 mg at 07/10/15 [id number]     Discharge Medications: Please see discharge summary for a list of discharge medications.  Relevant Imaging Results:  Relevant Lab Results:   Additional Information  (SSN: [id number])  Leane Call, Student-SW [phone]

## 2015-07-10 NOTE — Progress Notes (Addendum)
Subjective: 2 Days Post-Op Procedure(s) (LRB): LEFT  HIP HEMIARTHROPLASTY (Left)   Patient resting comfortably un bed with wife bedside. He is very sleepy but is arousable.  Activity level:  wbat with posterior hip precautions Diet tolerance:  ok Voiding:  ok Patient reports pain as mild.    Objective: Vital signs in last 24 hours: Temp:  [97.7 F (36.5 C)-98.9 F (37.2 C)] 98.6 F (37 C) (01/19 0619) Pulse Rate:  [52-61] 52 (01/19 0619) Resp:  [17-18] 18 (01/19 0619) BP: (116-125)/(44-52) 125/52 mmHg (01/19 0619) SpO2:  [90 %-98 %] 98 % (01/19 0619)  Labs:  Recent Labs  07/09/15 0700 07/10/15 0649  HGB 9.6* 8.7*    Recent Labs  07/09/15 0700 07/10/15 0649  WBC 11.5* 10.2  RBC 3.29* 2.88*  HCT 30.4* 26.6*  PLT 121* 102*    Recent Labs  07/09/15 0700 07/10/15 0649  NA 139 141  K 4.8 4.6  CL 103 103  CO2 29 26  BUN 57* 63*  CREATININE 2.62* 2.37*  GLUCOSE 168* 150*  CALCIUM 8.4* 8.4*   No results for input(s): LABPT, INR in the last 72 hours.  Physical Exam:  Neurologically intact ABD soft Neurovascular intact Sensation intact distally Intact pulses distally Dorsiflexion/Plantar flexion intact Incision: dressing C/D/I and scant drainage No cellulitis present Compartment soft  Assessment/Plan:  2 Days Post-Op Procedure(s) (LRB): LEFT  HIP HEMIARTHROPLASTY (Left) Advance diet Up with therapy Discharge to SNF once cleared by PT and medical team. From an orthopaedic standpoint he is clear. Continue on ASA 325mg  BID x 4 weeks post op. Follow up in office 2 weeks post op. We greatly appreciate medical management. Continue WBAT with posterior hip precautions.   NIDA, Larwance Sachs 07/10/2015, 11:47 AM  Addendum: Came by this afternoon as well. Baseline mental state.  Good ROM hip.  Appreciate medical management.

## 2015-07-11 DIAGNOSIS — E0842 Diabetes mellitus due to underlying condition with diabetic polyneuropathy: Secondary | ICD-10-CM | POA: Diagnosis not present

## 2015-07-11 DIAGNOSIS — M1990 Unspecified osteoarthritis, unspecified site: Secondary | ICD-10-CM | POA: Diagnosis not present

## 2015-07-11 DIAGNOSIS — M6281 Muscle weakness (generalized): Secondary | ICD-10-CM | POA: Diagnosis not present

## 2015-07-11 DIAGNOSIS — S72002A Fracture of unspecified part of neck of left femur, initial encounter for closed fracture: Secondary | ICD-10-CM | POA: Diagnosis not present

## 2015-07-11 DIAGNOSIS — L89319 Pressure ulcer of right buttock, unspecified stage: Secondary | ICD-10-CM | POA: Diagnosis not present

## 2015-07-11 DIAGNOSIS — N139 Obstructive and reflux uropathy, unspecified: Secondary | ICD-10-CM | POA: Diagnosis not present

## 2015-07-11 DIAGNOSIS — N401 Enlarged prostate with lower urinary tract symptoms: Secondary | ICD-10-CM | POA: Diagnosis not present

## 2015-07-11 DIAGNOSIS — N183 Chronic kidney disease, stage 3 (moderate): Secondary | ICD-10-CM | POA: Diagnosis not present

## 2015-07-11 DIAGNOSIS — M25552 Pain in left hip: Secondary | ICD-10-CM | POA: Diagnosis not present

## 2015-07-11 DIAGNOSIS — I119 Hypertensive heart disease without heart failure: Secondary | ICD-10-CM | POA: Diagnosis not present

## 2015-07-11 DIAGNOSIS — I739 Peripheral vascular disease, unspecified: Secondary | ICD-10-CM | POA: Diagnosis not present

## 2015-07-11 DIAGNOSIS — D500 Iron deficiency anemia secondary to blood loss (chronic): Secondary | ICD-10-CM | POA: Diagnosis not present

## 2015-07-11 DIAGNOSIS — S30810S Abrasion of lower back and pelvis, sequela: Secondary | ICD-10-CM | POA: Diagnosis not present

## 2015-07-11 DIAGNOSIS — L98419 Non-pressure chronic ulcer of buttock with unspecified severity: Secondary | ICD-10-CM | POA: Diagnosis not present

## 2015-07-11 DIAGNOSIS — L8990 Pressure ulcer of unspecified site, unspecified stage: Secondary | ICD-10-CM

## 2015-07-11 DIAGNOSIS — I2510 Atherosclerotic heart disease of native coronary artery without angina pectoris: Secondary | ICD-10-CM | POA: Diagnosis not present

## 2015-07-11 DIAGNOSIS — G3184 Mild cognitive impairment, so stated: Secondary | ICD-10-CM | POA: Diagnosis not present

## 2015-07-11 DIAGNOSIS — I10 Essential (primary) hypertension: Secondary | ICD-10-CM | POA: Diagnosis not present

## 2015-07-11 DIAGNOSIS — E119 Type 2 diabetes mellitus without complications: Secondary | ICD-10-CM | POA: Diagnosis not present

## 2015-07-11 DIAGNOSIS — L89310 Pressure ulcer of right buttock, unstageable: Secondary | ICD-10-CM | POA: Diagnosis not present

## 2015-07-11 DIAGNOSIS — R296 Repeated falls: Secondary | ICD-10-CM | POA: Diagnosis not present

## 2015-07-11 DIAGNOSIS — S80812S Abrasion, left lower leg, sequela: Secondary | ICD-10-CM | POA: Diagnosis not present

## 2015-07-11 DIAGNOSIS — S72002S Fracture of unspecified part of neck of left femur, sequela: Secondary | ICD-10-CM | POA: Diagnosis not present

## 2015-07-11 DIAGNOSIS — L97122 Non-pressure chronic ulcer of left thigh with fat layer exposed: Secondary | ICD-10-CM | POA: Diagnosis not present

## 2015-07-11 DIAGNOSIS — E785 Hyperlipidemia, unspecified: Secondary | ICD-10-CM | POA: Diagnosis not present

## 2015-07-11 DIAGNOSIS — D649 Anemia, unspecified: Secondary | ICD-10-CM | POA: Diagnosis not present

## 2015-07-11 DIAGNOSIS — M545 Low back pain: Secondary | ICD-10-CM | POA: Diagnosis not present

## 2015-07-11 DIAGNOSIS — S72002D Fracture of unspecified part of neck of left femur, subsequent encounter for closed fracture with routine healing: Secondary | ICD-10-CM | POA: Diagnosis not present

## 2015-07-11 LAB — CBC
HCT: 25.7 % — ABNORMAL LOW (ref 39.0–52.0)
Hemoglobin: 8.1 g/dL — ABNORMAL LOW (ref 13.0–17.0)
MCH: 29.1 pg (ref 26.0–34.0)
MCHC: 31.5 g/dL (ref 30.0–36.0)
MCV: 92.4 fL (ref 78.0–100.0)
Platelets: 126 10*3/uL — ABNORMAL LOW (ref 150–400)
RBC: 2.78 MIL/uL — ABNORMAL LOW (ref 4.22–5.81)
RDW: 14.8 % (ref 11.5–15.5)
WBC: 7.9 10*3/uL (ref 4.0–10.5)

## 2015-07-11 LAB — BASIC METABOLIC PANEL WITH GFR
Anion gap: 6 (ref 5–15)
BUN: 59 mg/dL — ABNORMAL HIGH (ref 6–20)
CO2: 27 mmol/L (ref 22–32)
Calcium: 8 mg/dL — ABNORMAL LOW (ref 8.9–10.3)
Chloride: 107 mmol/L (ref 101–111)
Creatinine, Ser: 1.93 mg/dL — ABNORMAL HIGH (ref 0.61–1.24)
GFR calc Af Amer: 33 mL/min — ABNORMAL LOW
GFR calc non Af Amer: 28 mL/min — ABNORMAL LOW
Glucose, Bld: 134 mg/dL — ABNORMAL HIGH (ref 65–99)
Potassium: 4.3 mmol/L (ref 3.5–5.1)
Sodium: 140 mmol/L (ref 135–145)

## 2015-07-11 LAB — GLUCOSE, CAPILLARY
GLUCOSE-CAPILLARY: 185 mg/dL — AB (ref 65–99)
GLUCOSE-CAPILLARY: 121 mg/dL — AB (ref 65–99)

## 2015-07-11 MED ORDER — ONDANSETRON HCL 4 MG PO TABS: 4.0000 mg | ORAL_TABLET | Freq: Four times a day (QID) | ORAL | Status: DC | PRN

## 2015-07-11 MED ORDER — ASPIRIN 325 MG PO TBEC: 325.0000 mg | DELAYED_RELEASE_TABLET | Freq: Two times a day (BID) | ORAL | Status: DC

## 2015-07-11 MED ORDER — CARVEDILOL 12.5 MG PO TABS: 12.5000 mg | ORAL_TABLET | Freq: Two times a day (BID) | ORAL | Status: DC

## 2015-07-11 MED ORDER — DOCUSATE SODIUM 100 MG PO CAPS: 200.0000 mg | ORAL_CAPSULE | Freq: Two times a day (BID) | ORAL | Status: DC

## 2015-07-11 MED ORDER — FERROUS SULFATE 325 (65 FE) MG PO TABS: 325.0000 mg | ORAL_TABLET | Freq: Three times a day (TID) | ORAL | Status: DC

## 2015-07-11 MED ORDER — FUROSEMIDE 40 MG PO TABS: 40.0000 mg | ORAL_TABLET | Freq: Every day | ORAL | Status: DC

## 2015-07-11 MED ORDER — MUPIROCIN 2 % EX OINT: 1.0000 "application " | TOPICAL_OINTMENT | Freq: Two times a day (BID) | CUTANEOUS | Status: DC

## 2015-07-11 NOTE — Progress Notes (Signed)
Subjective: 3 Days Post-Op Procedure(s) (LRB): LEFT  HIP HEMIARTHROPLASTY (Left)   Patient resting comfortably in bed this morning. He is more alert. He denies any pain in his hip.  Activity level:  wbat with posterior hip precautions Diet tolerance:  ok Patient reports pain as mild.    Objective: Vital signs in last 24 hours: Temp:  [98.4 F (36.9 C)-98.6 F (37 C)] 98.4 F (36.9 C) (01/19 2251) Pulse Rate:  [64] 64 (01/19 2251) Resp:  [18] 18 (01/19 2251) BP: (127-159)/(62-90) 127/90 mmHg (01/19 2251) SpO2:  [94 %-97 %] 94 % (01/19 2251) FiO2 (%):  [2 %] 2 % (01/19 1352)  Labs:  Recent Labs  07/09/15 0700 07/10/15 0649 07/11/15 0426  HGB 9.6* 8.7* 8.1*    Recent Labs  07/10/15 0649 07/11/15 0426  WBC 10.2 7.9  RBC 2.88* 2.78*  HCT 26.6* 25.7*  PLT 102* 126*    Recent Labs  07/10/15 0649 07/11/15 0426  NA 141 140  K 4.6 4.3  CL 103 107  CO2 26 27  BUN 63* 59*  CREATININE 2.37* 1.93*  GLUCOSE 150* 134*  CALCIUM 8.4* 8.0*   No results for input(s): LABPT, INR in the last 72 hours.  Physical Exam:  Neurologically intact ABD soft Neurovascular intact Sensation intact distally Intact pulses distally Dorsiflexion/Plantar flexion intact Incision: dressing C/D/I and scant drainage No cellulitis present Compartment soft  Assessment/Plan:  3 Days Post-Op Procedure(s) (LRB): LEFT  HIP HEMIARTHROPLASTY (Left) Advance diet Up with therapy Discharge to SNF once cleared by PT and the medical team. Continue on ASA 325mg  BID x 4 weeks for DVT prevention. Follow up in office 2 weeks post op. We greatly appreciate medical management. Continue WBAT with posterior hip precautions  Naiya Corral, Larwance Sachs 07/11/2015, 7:59 AM

## 2015-07-11 NOTE — Clinical Social Work Placement (Signed)
   CLINICAL SOCIAL WORK PLACEMENT  NOTE  Date:  07/11/2015  Patient Details  Name: [name] MRN: [id number] Date of Birth: Dec 04, 1920  Clinical Social Work is seeking post-discharge placement for this patient at the [place] level of care (*CSW will initial, date and re-position this form in  chart as items are completed):  Yes   Patient/family provided with [place] Work Department's list of facilities offering this level of care within the geographic area requested by the patient (or if unable, by the patient's family).  Yes   Patient/family informed of their freedom to choose among providers that offer the needed level of care, that participate in Medicare, Medicaid or managed care program needed by the patient, have an available bed and are willing to accept the patient.  Yes   Patient/family informed of [place]'s ownership interest in [facility] and [facility], as well as of the fact that they are under no obligation to receive care at these facilities.  PASRR submitted to EDS on 07/11/15     PASRR number received on 07/11/15     Existing PASRR number confirmed on       FL2 transmitted to all facilities in geographic area requested by pt/family on 07/10/15     FL2 transmitted to all facilities within larger geographic area on       Patient informed that his/her managed care company has contracts with or will negotiate with certain facilities, including the following:        Yes   Patient/family informed of bed offers received.  Patient [facility] bed [facility]     Physician recommends and patient chooses bed at      Patient to be transferred to [facility] on 07/11/15.  Patient to be transferred to facility by [facility]     Patient family notified on 07/11/15 of transfer.  Name of family member notified:  Lucio Wyke patient's wife     PHYSICIAN Please sign FL2     Additional Comment:     _______________________________________________ Ross Ludwig, LCSWA 07/11/2015, 5:30 PM

## 2015-07-11 NOTE — Discharge Instructions (Signed)
Follow with Primary MD Horatio Pel, MD after discharge from SNF. Get CBC, CMP,  checked  by Primary MD next visit.    Activity: As tolerated with Full fall precautions use walker/cane & assistance as needed   Disposition SNF   Diet: Heart Healthy  , with feeding assistance and aspiration precautions.  For Heart failure patients - Check your Weight same time everyday, if you gain over 2 pounds, or you develop in leg swelling, experience more shortness of breath or chest pain, call your Primary MD immediately. Follow Cardiac Low Salt Diet and 1.5 lit/day fluid restriction.   On your next visit with your primary care physician please Get Medicines reviewed and adjusted.   Please request your Prim.MD to go over all Hospital Tests and Procedure/Radiological results at the follow up, please get all Hospital records sent to your Prim MD by signing hospital release before you go home.   If you experience worsening of your admission symptoms, develop shortness of breath, life threatening emergency, suicidal or homicidal thoughts you must seek medical attention immediately by calling 911 or calling your MD immediately  if symptoms less severe.  You Must read complete instructions/literature along with all the possible adverse reactions/side effects for all the Medicines you take and that have been prescribed to you. Take any new Medicines after you have completely understood and accpet all the possible adverse reactions/side effects.   Do not drive, operating heavy machinery, perform activities at heights, swimming or participation in water activities or provide baby sitting services if your were admitted for syncope or siezures until you have seen by Primary MD or a Neurologist and advised to do so again.  Do not drive when taking Pain medications.    Do not take more than prescribed Pain, Sleep and Anxiety Medications  Special Instructions: If you have smoked or chewed Tobacco  in  the last 2 yrs please stop smoking, stop any regular Alcohol  and or any Recreational drug use.  Wear Seat belts while driving.   Please note  You were cared for by a hospitalist during your hospital stay. If you have any questions about your discharge medications or the care you received while you were in the hospital after you are discharged, you can call the unit and asked to speak with the hospitalist on call if the hospitalist that took care of you is not available. Once you are discharged, your primary care physician will handle any further medical issues. Please note that NO REFILLS for any discharge medications will be authorized once you are discharged, as it is imperative that you return to your primary care physician (or establish a relationship with a primary care physician if you do not have one) for your aftercare needs so that they can reassess your need for medications and monitor your lab values.

## 2015-07-11 NOTE — Progress Notes (Signed)
Subjective:  Confusion today earlier.  Fine now.   Awake and denies chest pain or SOB.  Objective:  Vital Signs in the last 24 hours: BP 149/53 mmHg  Pulse 57  Temp(Src) 98.1 F (36.7 C) (Oral)  Resp 18  Ht 5' 9.5" (1.765 m)  SpO2 99%  Physical Exam: Elderly WM in NAD Lungs:  Reduced BS Cardiac:  Regular rhythm, normal S1 and S2, no S3, 2/6 sys murmur Extremities:  No edema present, reduced pulses  Intake/Output from previous day: 01/19 0701 - 01/20 0700 In: 480 [P.O.:480] Out: 1101 [Urine:1100; Stool:1]   Lab Results: Basic Metabolic Panel:  Recent Labs  07/10/15 0649 07/11/15 0426  NA 141 140  K 4.6 4.3  CL 103 107  CO2 26 27  GLUCOSE 150* 134*  BUN 63* 59*  CREATININE 2.37* 1.93*   CBC:  Recent Labs  07/10/15 0649 07/11/15 0426  WBC 10.2 7.9  HGB 8.7* 8.1*  HCT 26.6* 25.7*  MCV 92.4 92.4  PLT 102* 126*   Telemetry: Sinus with frequent PAC's  Assessment/Plan:  1. Stable following hip surgery with no apparent cardiac comps 2. History of CAD 3. Stage 4 CKD with acute renal failure post surgery. Continues to improve.   4. Renovascular hypertension 5. Anemia due to blood loss  Rec:  OK for transfer to SNF.  Restart higher Lasix dose and follow weights when in SNF.      Joshua Hough  MD [facility]  07/11/2015, 1:11 PM

## 2015-07-11 NOTE — Progress Notes (Signed)
Patient for discharge to [facility]. Report given to Rocky Crafts- LPN. All questions were answered. IV saline lock removed.

## 2015-07-11 NOTE — Clinical Social Work Note (Signed)
Clinical Social Work Assessment  Patient Details  Name: [name] MRN: [id number] Date of Birth: August 27, 1920  Date of referral:  07/10/15               Reason for consult:  Facility Placement, Discharge Planning                Permission sought to share information with:  Family Supports Permission granted to share information::  Yes, Verbal Permission Granted  Name::      Animator)  Agency::   (SNF)  Relationship::   (spouse, Clinical cytogeneticist)  Sport and exercise psychologist Information:   Vickii Chafe [place], [phone])  Housing/Transportation Living arrangements for the past 2 months:  Single Family Home Source of Information:  Spouse Patient Interpreter Needed:  None Criminal Activity/Legal Involvement Pertinent to Current Situation/Hospitalization:  No - Comment as needed Significant Relationships:  Spouse Lives with:  Spouse Do you feel safe going back to the place where you live?  No Need for family participation in patient care:  Yes (Comment)  Care giving concerns: Patient's family feels he needs some short term rehab before he can go home.  Social Worker assessment / plan:  Patient is a 82 year old male who has some minor confusion.  Patient is married and lives with his wife.  Patient has never been to SNF for short term rehab, CSW explained process of what to expect while he is at rehab.  Patient's wife states that she feels he needs some short term rehab before he can safely go home.  Patient's wife expressed she did not have any other questions about going to SNF.  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  Patient/Family's Understanding of and Emotional Response to Diagnosis, Current Treatment, and Prognosis:  Patient's wife is  aware of current treatment and prognosis.  Emotional Assessment Appearance:  Appears stated age Attitude/Demeanor/Rapport:   (calm, sleeping) Affect (typically observed):  Accepting, Appropriate, Quiet, Calm Orientation:  Oriented to Self, Oriented to Place, Oriented to  Time, Oriented to Situation Alcohol / Substance use:  Not Applicable Psych involvement (Current and /or in the community):  No (Comment)  Discharge Needs  Concerns to be addressed:  No discharge needs identified Readmission within the last 30 days:  No Current discharge risk:  None Barriers to Discharge:  No Barriers Identified   Ross Ludwig, LCSWA 07/11/2015, 5:34 PM

## 2015-07-11 NOTE — Discharge Summary (Addendum)
[name], is a 82 y.o. male  DOB Sep 13, 1920  MRN [id number].  Admission date:  07/07/2015  Admitting Physician  Reyne Dumas, MD  Discharge Date:  07/11/2015   Primary MD  Horatio Pel, MD  Recommendations for primary care physician for things to follow:  - Please check CBC, BMP in 3 days. - Please adjust Lasix dose as needed, given volume status and renal function, patient is on 40 mg oral 3 times a day at home, discharged on 40 mg oral daily. - Aspirin 325 mg oral twice a day for DVT prophylaxis 4 weeks, after that can be changed back to aspirin 81 mg oral daily. - patient will need CT chest without contrast to follow on incidental finding of lung opacity on chest x-ray. - Patient will need voiding trial in 1 week.  Admission Diagnosis  Closed left hip fracture, initial encounter [facility]) [S72.002A]   Discharge Diagnosis  Closed left hip fracture, initial encounter ([place]) [S72.002A]    Active Problems:   Bursitis of right hip   PVD (peripheral vascular disease) (HCC)   CAD (coronary artery disease)   CKD (chronic kidney disease) stage 3, GFR 30-59 ml/min   Diabetes mellitus type II, controlled ([place])   HTN (hypertension)   Anemia   Diabetic polyneuropathy ([place])   Hip fracture (HCC)      Past Medical History  Diagnosis Date  . CKD (chronic kidney disease) stage 4, GFR 15-29 ml/min (HCC)   . Type 2 diabetes mellitus with peripheral neuropathy (HCC)   . Hypertension   . MI (myocardial infarction) ([place])     1974  . Peripheral vascular disease ([place])     veinsi rerouted in both legs   . Pneumonia     hx of   . Anemia   . Blood transfusion     hx of 6 months ago   . GERD (gastroesophageal reflux disease)   . Arthritis   . Bowel obstruction ([place])   . Gait disorder   . Depression   . Dyslipidemia   . C. difficile colitis   . History of GI bleed     Upper  . Lumbosacral spinal  stenosis   . Memory deficit 11/29/2012  . Degenerative arthritis   . Peptic ulcer disease     Past Surgical History  Procedure Laterality Date  . Lumbar laminectomy      back surgery x 2   . Eye surgery      bilateral cataract surgery with implants   . Hemorroidectomy    . Carpal tunnel release Right   . Other surgical history      big toe nails removed   . Other surgical history      bilateral kidney stents  2007  . Other surgical history      small bowel obstruction 2011   . Excision/release bursa hip  08/13/2011    Procedure: EXCISION/RELEASE BURSA HIP;  Surgeon: Tobi Bastos, MD;  Location: [facility];  Service: Orthopedics;  Laterality: Right;  Right  Hip Excision Tumor  . Esophageal manometry  11/08/2011    Procedure: ESOPHAGEAL MANOMETRY (EM);  Surgeon: Beryle Beams, MD;  Location: [facility];  Service: Endoscopy;  Laterality: N/A;  . Cataract extraction Bilateral   . Lipoma resection    . Transurethral resection of prostate      For benign prostate hypertrophy  . Carpal tunnel release Bilateral   . Ureteral stent placement Bilateral   . Cervical laminectomy    . Skin cancer resection      Left forehead  . Femoral-popliteal bypass graft Left 07-08-1997    Curt Jews MD  . Femoral-popliteal bypass graft Right 06-04-1997    Curt Jews MD  . Carpal tunnel release Left 11/20/2013    Procedure: LEFT  CARPAL TUNNEL RELEASE;  Surgeon: Wynonia Sours, MD;  Location: [place];  Service: Orthopedics;  Laterality: Left;  . Hypothenar fat pad transfer Left 11/20/2013    Procedure: HYPOTHENAR FAT PAD TRANSFER LEFT;  Surgeon: Wynonia Sours, MD;  Location: [place];  Service: Orthopedics;  Laterality: Left;  . Hip arthroplasty Left 07/08/2015    Procedure: LEFT  HIP HEMIARTHROPLASTY;  Surgeon: Melrose Nakayama, MD;  Location: [place];  Service: Orthopedics;  Laterality: Left;       History of present illness and  Hospital Course:     Kindly see H&P for  history of present illness and admission details, please review complete Labs, Consult reports and Test reports for all details in brief  HPI  from the history and physical done on the day of admission  07/07/2015 82 year old male, with known history of DM-2, HTN, CAD, s/p MI 1974, CHF, PVD, dyslipidemia, GERD, OA, s/p back surgery x 2, s/p right carpal tunnel surgery, s/p hemorrhoidectomy, BPH/obstructive uropathy, s/p prostate surgery, PVD, s/p revascularization, bilateral LE, s/p bilateral renal artery stents, CKD stage III, presents to the ER after a mechanical fall while trying to get out of bed and losing his balance, and falling on the left hip without any loss of consciousness. Patient uses a walker at baseline. He is followed by [facility] neurology for his severe diabetic peripheral neuropathy affecting his hips and knees, associated with a lot of pain with weightbearing. He has had balance issues and uses a wheelchair outside the house. Recently the patient has also had some memory issues, severe constipation related to Percocet, numbness of the left hand, recently placed in a wrist splint. Workup in the ER reveals that the patient slightly dehydrated creatinine of 2.14, baseline 1.5, x-ray of the left hip reveals a subcapital left femoral neck fracture. Orthopedics , Dr. Berenice Primas has been notified   Hospital Course  82 year old male, ambulates with the help of a walker, with history of DM 2 with peripheral neuropathy, HTN, CAD, MI, chronic CHF (unknown type-no echo in system), PAD, dyslipidemia, GERD, RAS status post bilateral stents, stage III chronic kidney disease, hard of hearing, initially [facility] following mechanical fall at home while trying to get out of bed. Imaging revealed subcapital left femoral neck fracture. Orthopedics was consulted and recommended transferring to [facility] for surgery on 1/17. Patient underwent left hip hemiarthroplasty on 1/17.  Left femoral neck  fracture, status post hemiarthroplasty - Orthopedics was consulted and recommended transferring patient [facility] to [facility] for surgery on 1/17. - Patient underwent left hip hemiarthroplasty on 1/17. - continue with as needed pain medicine. - on Aspirin for DVT prophylaxis.  Chronic CHF, unknown type -  No echo in system.Prior LV function has been normal.  - Clinically compensated. Resumed on low dose Lasix, agent on 40 mg 4 times a day at home, will discharge on 40 mg oral daily,  PAD - Status post femoropopliteal bypass in the past. No symptoms suggestive of claudication. Follows with Dr. Donnetta Hutching  CAD - Asymptomatic , no chest pain  Acute on chronic kidney disease Stage III  - Baseline creatinine said to be in the 1.4-1.5. Initially presented with creatinine of 2.14. Lasix and ARB temporarily held.  - Under the care of Dr. Lorrene Reid as outpatient. - creatinine peaked at 2.6 on 07/09/2015, improving on IV fluid, creatinine is 1.9 on discharge, continue to hold losartan, resumed on Lasix 40 mg oral daily, recheck BMP in 3 days.  Type II DM with peripheral neuropathy - Last A1c: 5.4. Continue SSI. Controlled in the hospital.  Essential hypertension - Controlled. Continue Coreg, will decrease dose given her crit on the lower side.  Acute urinary retention/BPH - Foley catheter placed in ED due to urinary retention. He has apparently needed home Foley catheter in the past. Continue doxazosin. - foley catheter discontinued 07/09/2015, with multiple episodes of urinary retention after that, Foley catheter required to be reinserted, a shunt will need voiding trial and SNF in 1 week.  Constipation - Bowel regimen.  Anemia of chronic disease/chronic kidney disease - Stable. Follow CBC in a.m.  Right mid lung opacity on chest x-ray  - Cannot exclude pulmonary nodule. Will need CT chest without contrast as an outpatient.  Pressure Ulcer - We'll have wound care see patient prior to  discharge  regarding recommendation.  Anemia - Baseline hemoglobin is 10-11, hemoglobin is 8.1 at day of discharge discharge, this is postoperative anemia, dilutional and anemia of chronic disease, will increase iron supplement.  Discharge Condition:  stable   Follow UP  Follow-up Information    Follow up with DALLDORF,PETER G, MD. Schedule an appointment as soon as possible for a visit in 2 weeks.   Specialty:  Orthopedic Surgery   Contact information:   [place] [address] [phone]       Follow up with Horatio Pel, MD.   Specialty:  Internal Medicine   Why:  After discharge from SNF   Contact information:   [place] [id number] [phone]         Discharge Instructions  and  Discharge Medications    Discharge Instructions    Discharge instructions    Complete by:  As directed   Follow with Primary MD Horatio Pel, MD after discharge from SNF. Get CBC, CMP,  checked  by Primary MD next visit.    Activity: As tolerated with Full fall precautions use walker/cane & assistance as needed   Disposition SNF   Diet: Heart Healthy  , with feeding assistance and aspiration precautions.  For Heart failure patients - Check your Weight same time everyday, if you gain over 2 pounds, or you develop in leg swelling, experience more shortness of breath or chest pain, call your Primary MD immediately. Follow Cardiac Low Salt Diet and 1.5 lit/day fluid restriction.   On your next visit with your primary care physician please Get Medicines reviewed and adjusted.   Please request your Prim.MD to go over all Hospital Tests and Procedure/Radiological results at the follow up, please get all Hospital records sent to your Prim MD by signing hospital release before you go home.   If you experience  worsening of your admission symptoms, develop shortness of breath, life threatening emergency, suicidal or homicidal thoughts  you must seek medical attention immediately by calling 911 or calling your MD immediately  if symptoms less severe.  You Must read complete instructions/literature along with all the possible adverse reactions/side effects for all the Medicines you take and that have been prescribed to you. Take any new Medicines after you have completely understood and accpet all the possible adverse reactions/side effects.   Do not drive, operating heavy machinery, perform activities at heights, swimming or participation in water activities or provide baby sitting services if your were admitted for syncope or siezures until you have seen by Primary MD or a Neurologist and advised to do so again.  Do not drive when taking Pain medications.    Do not take more than prescribed Pain, Sleep and Anxiety Medications  Special Instructions: If you have smoked or chewed Tobacco  in the last 2 yrs please stop smoking, stop any regular Alcohol  and or any Recreational drug use.  Wear Seat belts while driving.   Please note  You were cared for by a hospitalist during your hospital stay. If you have any questions about your discharge medications or the care you received while you were in the hospital after you are discharged, you can call the unit and asked to speak with the hospitalist on call if the hospitalist that took care of you is not available. Once you are discharged, your primary care physician will handle any further medical issues. Please note that NO REFILLS for any discharge medications will be authorized once you are discharged, as it is imperative that you return to your primary care physician (or establish a relationship with a primary care physician if you do not have one) for your aftercare needs so that they can reassess your need for medications and monitor your lab values.     Weight bearing as tolerated    Complete by:  As directed             Medication List    STOP taking these medications          lidocaine 5 % ointment  Commonly known as:  XYLOCAINE     losartan 100 MG tablet  Commonly known as:  COZAAR     oxyCODONE-acetaminophen 7.5-325 MG tablet  Commonly known as:  PERCOCET  Replaced by:  oxyCODONE-acetaminophen 5-325 MG tablet     UNABLE TO FIND      TAKE these medications        aspirin 325 MG EC tablet  Take 1 tablet (325 mg total) by mouth 2 (two) times daily at 8 am and 10 pm. Please take total of 4 weeks then stop     atorvastatin 20 MG tablet  Commonly known as:  LIPITOR  Take 20 mg by mouth daily.     carvedilol 12.5 MG tablet  Commonly known as:  COREG  Take 1 tablet (12.5 mg total) by mouth 2 (two) times daily with a meal.     docusate sodium 100 MG capsule  Commonly known as:  COLACE  Take 2 capsules (200 mg total) by mouth 2 (two) times daily.     doxazosin 4 MG tablet  Commonly known as:  CARDURA  Take 4 mg by mouth 2 (two) times daily.     ferrous sulfate 325 (65 FE) MG tablet  Take 1 tablet (325 mg total) by mouth 3 (three) times daily with meals.  finasteride 5 MG tablet  Commonly known as:  PROSCAR  Take 5 mg by mouth daily.     furosemide 40 MG tablet  Commonly known as:  LASIX  Take 1 tablet (40 mg total) by mouth daily.     magnesium hydroxide 400 MG/5ML suspension  Commonly known as:  MILK OF MAGNESIA  Take 7.5 mLs by mouth at bedtime.     mirtazapine 15 MG tablet  Commonly known as:  REMERON  Take 15 mg by mouth at bedtime.     multivitamin with minerals Tabs tablet  Take 1 tablet by mouth daily before breakfast.     mupirocin ointment 2 %  Commonly known as:  BACTROBAN  Place 1 application into the nose 2 (two) times daily. For 5 days     ondansetron 4 MG tablet  Commonly known as:  ZOFRAN  Take 1 tablet (4 mg total) by mouth every 6 (six) hours as needed for nausea.     oxyCODONE-acetaminophen 5-325 MG tablet  Commonly known as:  PERCOCET/ROXICET  Take 1 tablet by mouth every 6 (six) hours as needed for  moderate pain.     polyethylene glycol packet  Commonly known as:  MIRALAX / GLYCOLAX  Take 17 g by mouth at bedtime.     senna 8.6 MG tablet  Commonly known as:  SENOKOT  Take 1 tablet by mouth 4 (four) times daily.     sertraline 25 MG tablet  Commonly known as:  ZOLOFT  Take 25 mg by mouth daily.     vitamin B-12 1000 MCG tablet  Commonly known as:  CYANOCOBALAMIN  Take 1,000 mcg by mouth daily before breakfast.     vitamin C 500 MG tablet  Commonly known as:  ASCORBIC ACID  Take 500 mg by mouth daily before breakfast.          Diet and Activity recommendation: See Discharge Instructions above   Consults obtained - Orthopedic Cardiology   Major procedures and Radiology Reports - PLEASE review detailed and final reports for all details, in brief -   Left hip hemiarthroplasty 07/08/15    Dg Chest 1 View  07/07/2015  CLINICAL DATA:  Preoperative for left femur fracture repair. EXAM: CHEST 1 VIEW COMPARISON:  05/24/2012 chest radiograph. FINDINGS: Partially visualized is surgical plate with interlocking screws overlying the lower cervical spine. Stable cardiomediastinal silhouette with normal heart size. No pneumothorax. No pleural effusion. Stable biapical pleural-parenchymal scarring, asymmetric to the right. There is faint focal round opacity in the right midlung. IMPRESSION: Faint focal round opacity in the right midlung, cannot exclude a pulmonary nodule. Correlation with chest CT is advised. Electronically Signed   By: Ilona Sorrel M.D.   On: 07/07/2015 08:26   Dg Wrist Complete Right  07/07/2015  CLINICAL DATA:  Fall with right wrist pain EXAM: RIGHT WRIST - COMPLETE 3+ VIEW COMPARISON:  None. FINDINGS: There is prominent soft tissue swelling in the radial distal right forearm. No fracture, dislocation or suspicious focal osseous lesion. Severe osteoarthritis in the scaphoid- trapezium, first carpometacarpal and first metacarpophalangeal joints. Vascular  calcifications throughout the soft tissues. IMPRESSION: Prominent soft tissue swelling in the radial distal right forearm. No fracture or dislocation in the right wrist. Polyarticular osteoarthritis as described. Electronically Signed   By: Ilona Sorrel M.D.   On: 07/07/2015 08:22   Pelvis Portable  07/08/2015  CLINICAL DATA:  Left hip replacement today for subcapital fracture. Initial encounter. EXAM: PORTABLE PELVIS 1-2 VIEWS COMPARISON:  Plain films  left hip 07/07/2015. FINDINGS: New left hip arthroplasty is in place. The device is located. There is no fracture. Surgical staples are noted. No acute abnormality. IMPRESSION: Status post left hip replacement without evidence of complication. Electronically Signed   By: Inge Rise M.D.   On: 07/08/2015 14:10   Dg Hip Unilat With Pelvis 2-3 Views Left  07/07/2015  CLINICAL DATA:  Fall with left hip pain.  Initial encounter. EXAM: DG HIP (WITH OR WITHOUT PELVIS) 2-3V LEFT COMPARISON:  None. FINDINGS: Subcapital left femoral neck fracture with lateral displacement and mild posterior impaction with anterior fracture widening. The left femoral head is located. No notable degenerative changes to the acetabulum. The right hip is intact and located. No evidence of pelvic ring fracture or diastasis. Osteopenia and extensive atherosclerosis. IMPRESSION: Subcapital left femoral neck fracture. Electronically Signed   By: Monte Fantasia M.D.   On: 07/07/2015 07:47    Micro Results    Recent Results (from the past 240 hour(s))  Surgical pcr screen     Status: Abnormal   Collection Time: 07/08/15  4:20 AM  Result Value Ref Range Status   MRSA, PCR POSITIVE (A) NEGATIVE Final   Staphylococcus aureus POSITIVE (A) NEGATIVE Final    Comment:        The Xpert SA Assay (FDA approved for NASAL specimens in patients over 57 years of age), is one component of a comprehensive surveillance program.  Test performance has been validated by [facility] for  patients greater than or equal to 67 year old. It is not intended to diagnose infection nor to guide or monitor treatment. RESULT CALLED TO, READ BACK BY AND VERIFIED WITH: @ 7:52 ON 001/17/2017 BY K.PEELE        Today   Subjective:   Valdis Obenshain today denies any complaints. Denies pain. No chest pain or dyspnea, required Foley catheter to be reinserted given urinary retention.  Objective:   Blood pressure 155/42, pulse 56, temperature 98.4 F (36.9 C), temperature source Oral, resp. rate 18, height 5' 9.5" (1.765 m), SpO2 94 %.   Intake/Output Summary (Last 24 hours) at 07/11/15 1137 Last data filed at 07/11/15 0048  Gross per 24 hour  Intake    240 ml  Output   1101 ml  Net   -861 ml    Exam  General exam: Pleasant elderly male lying comfortably propped up in bed. Respiratory system: Slightly diminished breath sounds in the bases/poor inspiratory effort but otherwise clear to auscultation. No increased work of breathing. Cardiovascular system: S1 & S2 heard, RRR. No JVD, murmurs, gallops, clicks or pedal edema. Gastrointestinal system: Abdomen is nondistended/obese, soft and nontender. Normal bowel sounds heard. foley catheter +. Central nervous system: Alert and oriented. No focal neurological deficits. Extremities: Symmetric 5 x 5 power. Left hip surgical site dressing clean and dry. Left lower extremity in immobilizer.  Data Review   CBC w Diff: Lab Results  Component Value Date   WBC 7.9 07/11/2015   HGB 8.1* 07/11/2015   HCT 25.7* 07/11/2015   PLT 126* 07/11/2015   LYMPHOPCT 13 07/07/2015   MONOPCT 7 07/07/2015   EOSPCT 4 07/07/2015   BASOPCT 0 07/07/2015    CMP: Lab Results  Component Value Date   NA 140 07/11/2015   K 4.3 07/11/2015   CL 107 07/11/2015   CO2 27 07/11/2015   BUN 59* 07/11/2015   CREATININE 1.93* 07/11/2015   PROT 7.4 07/07/2015   ALBUMIN 4.1 07/07/2015   BILITOT 0.8  07/07/2015   ALKPHOS 72 07/07/2015   AST 26 07/07/2015    ALT 18 07/07/2015  .   Total Time in preparing paper work, data evaluation and todays exam - 35 minutes  ELGERGAWY, DAWOOD M.D on 07/11/2015 at 11:37 AM  [facility]   Office  [phone]

## 2015-07-11 NOTE — Consult Note (Addendum)
WOC wound consult note Reason for Consult: Consult requested for buttock wound.  Wife states patient fell at home on Mon and wonders if this might have occurred at that time.  Pt also went to the OR on 1/17.   Wound type: Deep tissue pressure injury was noted today to buttocks.  Wound appearance is consistent with prolonged pressure which occurred several days ago and is beginning to manifest itself at the surface level at this time.  Deep tissue injuries frequently progress to full thickness tissue loss despite optimal plan of care.  Wife at bedside observed wound appearance and discussed plan of care.  She took a Clinical research associate on her phone for reference later.  Pt is planning to discharge today to a SNF. Pressure Ulcer POA: No Measurement: 8X5cm Wound bed: Dark reddish purple blood filled blister with edges beginning to peel. Drainage (amount, consistency, odor) Scant amt pink drainage, no odor Dressing procedure/placement/frequency: Foam dressing has been appropriately applied to protect skin and patient needs to be positioned off affected area as often as possible to reduce pressure to the affected area. Discussed plan of care with wife, she denies further questions. Please re-consult if further assistance is needed.  Thank-you,  Julien Girt MSN, [place], [place], [place], [place]

## 2015-07-11 NOTE — Progress Notes (Signed)
Physical Therapy Treatment Patient Details Name: [name] MRN: [id number] DOB: Aug 21, 1920 Today's Date: 07/11/2015    History of Present Illness 82 y.o. male with broken hip now s/p  Lt hip hemiarthroplasty on 07/08/15 who fell in bathroom at home. PMH: CKD, diabetes, MI, hypertension, PVD, depression, stenosis    PT Comments    Patient continue to require +2 assistance with mobility but patient assisting with mobility during session. Patient positioned on Rt side within precautions for skin care. Patient remains appropriate for SNF at this time. PT to follow as appropriate to progress mobility.   Follow Up Recommendations  SNF;Supervision/Assistance - 24 hour     Equipment Recommendations  None recommended by PT    Recommendations for Other Services       Precautions / Restrictions Precautions Precautions: Fall;Posterior Hip Required Braces or Orthoses: Knee Immobilizer - Left Restrictions Weight Bearing Restrictions: Yes LLE Weight Bearing: Weight bearing as tolerated    Mobility  Bed Mobility Overal bed mobility: +2 for physical assistance;Needs Assistance Bed Mobility: Rolling Rolling: +2 for physical assistance;Max assist   Supine to sit: +2 for physical assistance;Max assist Sit to supine: +2 for physical assistance;Max assist   General bed mobility comments: Patient inconsistently following simple one step commands during transfer and sitting.   Transfers Overall transfer level: Needs assistance Equipment used: Rolling walker (2 wheeled) Transfers: Sit to/from Stand Sit to Stand: Max assist;+2 physical assistance         General transfer comment: repeat X2 with bed elevated. Stood <10 seconds each time.   Ambulation/Gait                 Stairs            Wheelchair Mobility    Modified Rankin (Stroke Patients Only)       Balance Overall balance assessment: Needs assistance Sitting-balance support: Bilateral upper extremity  supported Sitting balance-Leahy Scale: Poor Sitting balance - Comments: needing physical assist   Standing balance support: Bilateral upper extremity supported Standing balance-Leahy Scale: Poor Standing balance comment: needing physical assist                    Cognition Arousal/Alertness: Lethargic Behavior During Therapy: Flat affect Overall Cognitive Status: Impaired/Different from baseline Area of Impairment: Orientation;Following commands Orientation Level: Situation                  Exercises      General Comments        Pertinent Vitals/Pain Pain Assessment: No/denies pain    Home Living                      Prior Function            PT Goals (current goals can now be found in the care plan section) Acute Rehab PT Goals Patient Stated Goal: not expressed PT Goal Formulation: With patient/family Time For Goal Achievement: 07/23/15 Potential to Achieve Goals: Fair Progress towards PT goals: Progressing toward goals    Frequency  Min 3X/week    PT Plan Current plan remains appropriate    Co-evaluation             End of Session Equipment Utilized During Treatment: Left knee immobilizer Activity Tolerance: Patient limited by lethargy Patient left: in bed;with call bell/phone within reach;with bed alarm set;with family/visitor present;with nursing/sitter in room     Time: [id number] PT Time Calculation (min) (ACUTE ONLY): 23 min  Charges:  $Therapeutic Activity:  23-37 mins                    G Codes:      Cassell Clement, PT, CSCS Pager [phone] Office [phone]  07/11/2015, 1:00 PM

## 2015-07-11 NOTE — Clinical Social Work Note (Addendum)
CSW spoke with patient's family and they would like him to go to [facility].  CSW contacted [facility] to confirm bed availability for today.  [facility] said they can take him with updated PT notes.  CSW to continue to follow patient's progress.  Patient to be d/c'ed today to [facility].  Patient and family agreeable to plans will transport via ems RN to call report to [phone].  Jones Broom. [place], MSW, [place] 07/11/2015 12:36 PM

## 2015-07-14 DIAGNOSIS — S80812S Abrasion, left lower leg, sequela: Secondary | ICD-10-CM | POA: Diagnosis not present

## 2015-07-14 DIAGNOSIS — S30810S Abrasion of lower back and pelvis, sequela: Secondary | ICD-10-CM | POA: Diagnosis not present

## 2015-07-14 DIAGNOSIS — L89319 Pressure ulcer of right buttock, unspecified stage: Secondary | ICD-10-CM | POA: Diagnosis not present

## 2015-07-17 DIAGNOSIS — E0842 Diabetes mellitus due to underlying condition with diabetic polyneuropathy: Secondary | ICD-10-CM | POA: Diagnosis not present

## 2015-07-22 DIAGNOSIS — L97122 Non-pressure chronic ulcer of left thigh with fat layer exposed: Secondary | ICD-10-CM | POA: Diagnosis not present

## 2015-07-22 DIAGNOSIS — L98419 Non-pressure chronic ulcer of buttock with unspecified severity: Secondary | ICD-10-CM | POA: Diagnosis not present

## 2015-07-23 DIAGNOSIS — M25552 Pain in left hip: Secondary | ICD-10-CM | POA: Diagnosis not present

## 2015-07-23 DIAGNOSIS — M6281 Muscle weakness (generalized): Secondary | ICD-10-CM | POA: Diagnosis not present

## 2015-07-23 DIAGNOSIS — M545 Low back pain: Secondary | ICD-10-CM | POA: Diagnosis not present

## 2015-07-23 DIAGNOSIS — G3184 Mild cognitive impairment, so stated: Secondary | ICD-10-CM | POA: Diagnosis not present

## 2015-07-28 DIAGNOSIS — S72002D Fracture of unspecified part of neck of left femur, subsequent encounter for closed fracture with routine healing: Secondary | ICD-10-CM | POA: Diagnosis not present

## 2015-08-14 DIAGNOSIS — M545 Low back pain: Secondary | ICD-10-CM | POA: Diagnosis not present

## 2015-08-14 DIAGNOSIS — G3184 Mild cognitive impairment, so stated: Secondary | ICD-10-CM | POA: Diagnosis not present

## 2015-08-14 DIAGNOSIS — M25552 Pain in left hip: Secondary | ICD-10-CM | POA: Diagnosis not present

## 2015-08-14 DIAGNOSIS — M6281 Muscle weakness (generalized): Secondary | ICD-10-CM | POA: Diagnosis not present

## 2015-08-19 DIAGNOSIS — L89520 Pressure ulcer of left ankle, unstageable: Secondary | ICD-10-CM | POA: Diagnosis not present

## 2015-08-19 DIAGNOSIS — L97123 Non-pressure chronic ulcer of left thigh with necrosis of muscle: Secondary | ICD-10-CM | POA: Diagnosis not present

## 2015-08-19 DIAGNOSIS — S9001XD Contusion of right ankle, subsequent encounter: Secondary | ICD-10-CM | POA: Diagnosis not present

## 2015-08-20 DIAGNOSIS — M6281 Muscle weakness (generalized): Secondary | ICD-10-CM | POA: Diagnosis not present

## 2015-08-20 DIAGNOSIS — M545 Low back pain: Secondary | ICD-10-CM | POA: Diagnosis not present

## 2015-08-20 DIAGNOSIS — G3184 Mild cognitive impairment, so stated: Secondary | ICD-10-CM | POA: Diagnosis not present

## 2015-08-20 DIAGNOSIS — M25552 Pain in left hip: Secondary | ICD-10-CM | POA: Diagnosis not present

## 2015-08-25 DIAGNOSIS — S72002D Fracture of unspecified part of neck of left femur, subsequent encounter for closed fracture with routine healing: Secondary | ICD-10-CM | POA: Diagnosis not present

## 2015-08-25 DIAGNOSIS — M4806 Spinal stenosis, lumbar region: Secondary | ICD-10-CM | POA: Diagnosis not present

## 2015-08-25 DIAGNOSIS — L89310 Pressure ulcer of right buttock, unstageable: Secondary | ICD-10-CM | POA: Diagnosis not present

## 2015-08-25 DIAGNOSIS — R269 Unspecified abnormalities of gait and mobility: Secondary | ICD-10-CM | POA: Diagnosis not present

## 2015-08-29 DIAGNOSIS — R338 Other retention of urine: Secondary | ICD-10-CM | POA: Diagnosis not present

## 2015-08-29 DIAGNOSIS — S72002D Fracture of unspecified part of neck of left femur, subsequent encounter for closed fracture with routine healing: Secondary | ICD-10-CM | POA: Diagnosis not present

## 2015-08-29 DIAGNOSIS — N183 Chronic kidney disease, stage 3 (moderate): Secondary | ICD-10-CM | POA: Diagnosis not present

## 2015-08-29 DIAGNOSIS — I509 Heart failure, unspecified: Secondary | ICD-10-CM | POA: Diagnosis not present

## 2015-08-29 DIAGNOSIS — I130 Hypertensive heart and chronic kidney disease with heart failure and stage 1 through stage 4 chronic kidney disease, or unspecified chronic kidney disease: Secondary | ICD-10-CM | POA: Diagnosis not present

## 2015-08-29 DIAGNOSIS — E1151 Type 2 diabetes mellitus with diabetic peripheral angiopathy without gangrene: Secondary | ICD-10-CM | POA: Diagnosis not present

## 2015-08-29 DIAGNOSIS — L89510 Pressure ulcer of right ankle, unstageable: Secondary | ICD-10-CM | POA: Diagnosis not present

## 2015-08-29 DIAGNOSIS — L89150 Pressure ulcer of sacral region, unstageable: Secondary | ICD-10-CM | POA: Diagnosis not present

## 2015-08-29 DIAGNOSIS — N401 Enlarged prostate with lower urinary tract symptoms: Secondary | ICD-10-CM | POA: Diagnosis not present

## 2015-08-29 DIAGNOSIS — Z466 Encounter for fitting and adjustment of urinary device: Secondary | ICD-10-CM | POA: Diagnosis not present

## 2015-08-29 DIAGNOSIS — E1122 Type 2 diabetes mellitus with diabetic chronic kidney disease: Secondary | ICD-10-CM | POA: Diagnosis not present

## 2015-08-29 DIAGNOSIS — L89892 Pressure ulcer of other site, stage 2: Secondary | ICD-10-CM | POA: Diagnosis not present

## 2015-08-29 DIAGNOSIS — E1142 Type 2 diabetes mellitus with diabetic polyneuropathy: Secondary | ICD-10-CM | POA: Diagnosis not present

## 2015-08-30 ENCOUNTER — Inpatient Hospital Stay (HOSPITAL_COMMUNITY)
Admission: EM | Admit: 2015-08-30 | Discharge: 2015-09-02 | DRG: 371 | Disposition: A | Discharge status: Hospice | Payer: Medicare Other | Attending: Internal Medicine | Admitting: Internal Medicine

## 2015-08-30 ENCOUNTER — Encounter (HOSPITAL_COMMUNITY): Payer: Self-pay | Admitting: Oncology

## 2015-08-30 ENCOUNTER — Emergency Department (HOSPITAL_COMMUNITY): Payer: Medicare Other

## 2015-08-30 DIAGNOSIS — Z87891 Personal history of nicotine dependence: Secondary | ICD-10-CM | POA: Diagnosis not present

## 2015-08-30 DIAGNOSIS — R059 Cough, unspecified: Secondary | ICD-10-CM

## 2015-08-30 DIAGNOSIS — R911 Solitary pulmonary nodule: Secondary | ICD-10-CM | POA: Diagnosis present

## 2015-08-30 DIAGNOSIS — I132 Hypertensive heart and chronic kidney disease with heart failure and with stage 5 chronic kidney disease, or end stage renal disease: Secondary | ICD-10-CM | POA: Diagnosis not present

## 2015-08-30 DIAGNOSIS — L89154 Pressure ulcer of sacral region, stage 4: Secondary | ICD-10-CM | POA: Diagnosis present

## 2015-08-30 DIAGNOSIS — R64 Cachexia: Secondary | ICD-10-CM | POA: Diagnosis not present

## 2015-08-30 DIAGNOSIS — D638 Anemia in other chronic diseases classified elsewhere: Secondary | ICD-10-CM | POA: Diagnosis present

## 2015-08-30 DIAGNOSIS — N185 Chronic kidney disease, stage 5: Secondary | ICD-10-CM | POA: Diagnosis not present

## 2015-08-30 DIAGNOSIS — E1122 Type 2 diabetes mellitus with diabetic chronic kidney disease: Secondary | ICD-10-CM | POA: Diagnosis not present

## 2015-08-30 DIAGNOSIS — E860 Dehydration: Secondary | ICD-10-CM | POA: Diagnosis not present

## 2015-08-30 DIAGNOSIS — R05 Cough: Secondary | ICD-10-CM

## 2015-08-30 DIAGNOSIS — N184 Chronic kidney disease, stage 4 (severe): Secondary | ICD-10-CM

## 2015-08-30 DIAGNOSIS — R5383 Other fatigue: Secondary | ICD-10-CM | POA: Diagnosis not present

## 2015-08-30 DIAGNOSIS — N183 Chronic kidney disease, stage 3 (moderate): Secondary | ICD-10-CM | POA: Diagnosis not present

## 2015-08-30 DIAGNOSIS — I252 Old myocardial infarction: Secondary | ICD-10-CM | POA: Diagnosis not present

## 2015-08-30 DIAGNOSIS — I509 Heart failure, unspecified: Secondary | ICD-10-CM | POA: Diagnosis not present

## 2015-08-30 DIAGNOSIS — Z66 Do not resuscitate: Secondary | ICD-10-CM | POA: Diagnosis not present

## 2015-08-30 DIAGNOSIS — E785 Hyperlipidemia, unspecified: Secondary | ICD-10-CM | POA: Diagnosis present

## 2015-08-30 DIAGNOSIS — F0390 Unspecified dementia without behavioral disturbance: Secondary | ICD-10-CM | POA: Diagnosis present

## 2015-08-30 DIAGNOSIS — I10 Essential (primary) hypertension: Secondary | ICD-10-CM

## 2015-08-30 DIAGNOSIS — Z7189 Other specified counseling: Secondary | ICD-10-CM | POA: Diagnosis not present

## 2015-08-30 DIAGNOSIS — I2510 Atherosclerotic heart disease of native coronary artery without angina pectoris: Secondary | ICD-10-CM | POA: Diagnosis present

## 2015-08-30 DIAGNOSIS — A047 Enterocolitis due to Clostridium difficile: Secondary | ICD-10-CM | POA: Diagnosis not present

## 2015-08-30 DIAGNOSIS — E1142 Type 2 diabetes mellitus with diabetic polyneuropathy: Secondary | ICD-10-CM | POA: Diagnosis present

## 2015-08-30 DIAGNOSIS — K219 Gastro-esophageal reflux disease without esophagitis: Secondary | ICD-10-CM | POA: Diagnosis present

## 2015-08-30 DIAGNOSIS — I959 Hypotension, unspecified: Secondary | ICD-10-CM | POA: Diagnosis present

## 2015-08-30 DIAGNOSIS — M4628 Osteomyelitis of vertebra, sacral and sacrococcygeal region: Secondary | ICD-10-CM | POA: Diagnosis not present

## 2015-08-30 DIAGNOSIS — Z515 Encounter for palliative care: Secondary | ICD-10-CM | POA: Diagnosis not present

## 2015-08-30 DIAGNOSIS — N189 Chronic kidney disease, unspecified: Secondary | ICD-10-CM

## 2015-08-30 DIAGNOSIS — N390 Urinary tract infection, site not specified: Secondary | ICD-10-CM | POA: Diagnosis not present

## 2015-08-30 DIAGNOSIS — N179 Acute kidney failure, unspecified: Secondary | ICD-10-CM | POA: Diagnosis not present

## 2015-08-30 DIAGNOSIS — A0472 Enterocolitis due to Clostridium difficile, not specified as recurrent: Secondary | ICD-10-CM | POA: Diagnosis present

## 2015-08-30 DIAGNOSIS — R402411 Glasgow coma scale score 13-15, in the field [EMT or ambulance]: Secondary | ICD-10-CM | POA: Diagnosis not present

## 2015-08-30 DIAGNOSIS — E119 Type 2 diabetes mellitus without complications: Secondary | ICD-10-CM

## 2015-08-30 LAB — COMPREHENSIVE METABOLIC PANEL
ALBUMIN: 2.6 g/dL — AB (ref 3.5–5.0)
ALT: 24 U/L (ref 17–63)
AST: 29 U/L (ref 15–41)
Alkaline Phosphatase: 60 U/L (ref 38–126)
Anion gap: 16 — ABNORMAL HIGH (ref 5–15)
BILIRUBIN TOTAL: 1 mg/dL (ref 0.3–1.2)
BUN: 82 mg/dL — AB (ref 6–20)
CO2: 21 mmol/L — ABNORMAL LOW (ref 22–32)
CREATININE: 2.83 mg/dL — AB (ref 0.61–1.24)
Calcium: 8.4 mg/dL — ABNORMAL LOW (ref 8.9–10.3)
Chloride: 100 mmol/L — ABNORMAL LOW (ref 101–111)
GFR calc Af Amer: 21 mL/min — ABNORMAL LOW (ref >60)
GFR, EST NON AFRICAN AMERICAN: 18 mL/min — AB (ref >60)
GLUCOSE: 136 mg/dL — AB (ref 65–99)
POTASSIUM: 4.6 mmol/L (ref 3.5–5.1)
Sodium: 137 mmol/L (ref 135–145)
TOTAL PROTEIN: 6.4 g/dL — AB (ref 6.5–8.1)

## 2015-08-30 LAB — URINALYSIS, ROUTINE W REFLEX MICROSCOPIC
GLUCOSE, UA: NEGATIVE mg/dL
KETONES UR: NEGATIVE mg/dL
Nitrite: NEGATIVE
PH: 5 (ref 5.0–8.0)
PROTEIN: 100 mg/dL — AB
Specific Gravity, Urine: 1.019 (ref 1.005–1.030)

## 2015-08-30 LAB — POC OCCULT BLOOD, ED: Fecal Occult Bld: NEGATIVE

## 2015-08-30 LAB — CBC WITH DIFFERENTIAL/PLATELET
BASOS ABS: 0 10*3/uL (ref 0.0–0.1)
Basophils Relative: 0 %
EOS PCT: 0 %
Eosinophils Absolute: 0 10*3/uL (ref 0.0–0.7)
HEMATOCRIT: 30.3 % — AB (ref 39.0–52.0)
Hemoglobin: 9.5 g/dL — ABNORMAL LOW (ref 13.0–17.0)
LYMPHS ABS: 1 10*3/uL (ref 0.7–4.0)
Lymphocytes Relative: 8 %
MCH: 25.4 pg — ABNORMAL LOW (ref 26.0–34.0)
MCHC: 31.4 g/dL (ref 30.0–36.0)
MCV: 81 fL (ref 78.0–100.0)
MONO ABS: 0.5 10*3/uL (ref 0.1–1.0)
MONOS PCT: 4 %
NEUTROS ABS: 11.1 10*3/uL — AB (ref 1.7–7.7)
Neutrophils Relative %: 88 %
PLATELETS: 260 10*3/uL (ref 150–400)
RBC: 3.74 MIL/uL — ABNORMAL LOW (ref 4.22–5.81)
RDW: 16.3 % — AB (ref 11.5–15.5)
WBC: 12.6 10*3/uL — AB (ref 4.0–10.5)

## 2015-08-30 LAB — URINE MICROSCOPIC-ADD ON: SQUAMOUS EPITHELIAL / LPF: NONE SEEN

## 2015-08-30 LAB — I-STAT CG4 LACTIC ACID, ED: Lactic Acid, Venous: 1.2 mmol/L (ref 0.5–2.0)

## 2015-08-30 MED ORDER — SODIUM CHLORIDE 0.9 % IV BOLUS (SEPSIS)
1000.0000 mL | Freq: Once | INTRAVENOUS | Status: AC
  Administered 2015-08-30: 1000 mL via INTRAVENOUS

## 2015-08-30 MED ORDER — VANCOMYCIN HCL IN DEXTROSE 1-5 GM/200ML-% IV SOLN
1000.0000 mg | INTRAVENOUS | Status: AC
  Administered 2015-08-30: 1000 mg via INTRAVENOUS
  Filled 2015-08-30: qty 200

## 2015-08-30 MED ORDER — DEXTROSE 5 % IV SOLN
2.0000 g | Freq: Once | INTRAVENOUS | Status: AC
  Administered 2015-08-30: 2 g via INTRAVENOUS
  Filled 2015-08-30: qty 2

## 2015-08-30 NOTE — H&P (Signed)
PCP:  Horatio Pel, MD  Neurology [facility]  Referring provider Jeneen Rinks MD   Chief Complaint:  lethargy  HPI: [name] is a 82 y.o. male   has a past medical history of CKD (chronic kidney disease) stage 4, GFR 15-29 ml/min ([place]); Type 2 diabetes mellitus with peripheral neuropathy ([place]); Hypertension; MI (myocardial infarction) ([place]); Peripheral vascular disease ([place]); Pneumonia; Anemia; Blood transfusion; GERD (gastroesophageal reflux disease); Arthritis; Bowel obstruction ([place]); Gait disorder; Depression; Dyslipidemia; C. difficile colitis; History of GI bleed; Lumbosacral spinal stenosis; Memory deficit (11/29/2012); Degenerative arthritis; and Peptic ulcer disease.   Presented with increased lethargy at home. Patient was recently discharged from repeat had facility where he has been since January in the care of his wife who notices his been having progressive fatigue Yesterday he had an episode of nausea vomiting and diarrhea. Denies any fevers patient has indwelling Foley catheter. Wife did state that he often coughs and chokes when he tries to eat. She finally called [phone] He has known history of unstageable sacral decubitus with suspected osteomyelitis is been having increased drainage  IN ER: Creatinine up from baseline of 2 up to 2.83. In emergency by inpatient afebrile but initially hypotensive down to 70/40 was improved after IV fluid rehydration. Hemoglobin 9.5 WBC 12.6. UA showing bacteria as well as 6-30 white blood cell count Chest x-ray showed persistent nodularity worrisome for malignancy  Regarding pertinent past history: Generally patient was admitted for hip fracture and was discharged to rehabilitation facility. Patient has indwelling Foley catheter he has known history of diabetes and coronary artery disease. As well as stage IV chronic kidney disease. Regarding patient sacral decubiti was first noted in January and thought to be secondary to pressure  injury  Hospitalist was called for admission for dehydration secondary to nausea vomiting diarrhea, presents with progressive lethargy, dirty UA conization versus UTI  Review of Systems:    Pertinent positives include:  fatigue, drainage from ulcer in the sacral area  Constitutional:  No weight loss, night sweats, Fevers, chills,weight loss  HEENT:  No headaches, Difficulty swallowing,Tooth/dental problems,Sore throat,  No sneezing, itching, ear ache, nasal congestion, post nasal drip,  Cardio-vascular:  No chest pain, Orthopnea, PND, anasarca, dizziness, palpitations.no Bilateral lower extremity swelling  GI:  No heartburn, indigestion, abdominal pain, nausea, vomiting, diarrhea, change in bowel habits, loss of appetite, melena, blood in stool, hematemesis Resp:  no shortness of breath at rest. No dyspnea on exertion, No excess mucus, no productive cough, No non-productive cough, No coughing up of blood.No change in color of mucus.No wheezing. Skin:  no rash or lesions. No jaundice GU:  no dysuria, change in color of urine, no urgency or frequency. No straining to urinate.  No flank pain.  Musculoskeletal:  No joint pain or no joint swelling. No decreased range of motion. No back pain.  Psych:  No change in mood or affect. No depression or anxiety. No memory loss.  Neuro: no localizing neurological complaints, no tingling, no weakness, no double vision, no gait abnormality, no slurred speech, no confusion  Otherwise ROS are negative except for above, 10 systems were reviewed  Past Medical History: Past Medical History  Diagnosis Date  . CKD (chronic kidney disease) stage 4, GFR 15-29 ml/min (HCC)   . Type 2 diabetes mellitus with peripheral neuropathy (HCC)   . Hypertension   . MI (myocardial infarction) ([place])     1974  . Peripheral vascular disease ([place])     veinsi rerouted in both legs   .  Pneumonia     hx of   . Anemia   . Blood transfusion     hx of 6 months ago    . GERD (gastroesophageal reflux disease)   . Arthritis   . Bowel obstruction ([place])   . Gait disorder   . Depression   . Dyslipidemia   . C. difficile colitis   . History of GI bleed     Upper  . Lumbosacral spinal stenosis   . Memory deficit 11/29/2012  . Degenerative arthritis   . Peptic ulcer disease    Past Surgical History  Procedure Laterality Date  . Lumbar laminectomy      back surgery x 2   . Eye surgery      bilateral cataract surgery with implants   . Hemorroidectomy    . Carpal tunnel release Right   . Other surgical history      big toe nails removed   . Other surgical history      bilateral kidney stents  2007  . Other surgical history      small bowel obstruction 2011   . Excision/release bursa hip  08/13/2011    Procedure: EXCISION/RELEASE BURSA HIP;  Surgeon: Tobi Bastos, MD;  Location: [facility];  Service: Orthopedics;  Laterality: Right;  Right Hip Excision Tumor  . Esophageal manometry  11/08/2011    Procedure: ESOPHAGEAL MANOMETRY (EM);  Surgeon: Beryle Beams, MD;  Location: [facility];  Service: Endoscopy;  Laterality: N/A;  . Cataract extraction Bilateral   . Lipoma resection    . Transurethral resection of prostate      For benign prostate hypertrophy  . Carpal tunnel release Bilateral   . Ureteral stent placement Bilateral   . Cervical laminectomy    . Skin cancer resection      Left forehead  . Femoral-popliteal bypass graft Left 07-08-1997    Curt Jews MD  . Femoral-popliteal bypass graft Right 06-04-1997    Curt Jews MD  . Carpal tunnel release Left 11/20/2013    Procedure: LEFT  CARPAL TUNNEL RELEASE;  Surgeon: Wynonia Sours, MD;  Location: [facility];  Service: Orthopedics;  Laterality: Left;  . Hypothenar fat pad transfer Left 11/20/2013    Procedure: HYPOTHENAR FAT PAD TRANSFER LEFT;  Surgeon: Wynonia Sours, MD;  Location: [facility];  Service: Orthopedics;  Laterality: Left;  . Hip arthroplasty Left  07/08/2015    Procedure: LEFT  HIP HEMIARTHROPLASTY;  Surgeon: Melrose Nakayama, MD;  Location: [facility];  Service: Orthopedics;  Laterality: Left;     Medications: Prior to Admission medications   Medication Sig Start Date End Date Taking? Authorizing Provider  aspirin 325 MG EC tablet Take 1 tablet (325 mg total) by mouth 2 (two) times daily at 8 am and 10 pm. Please take total of 4 weeks then stop 07/11/15   Albertine Patricia, MD  atorvastatin (LIPITOR) 20 MG tablet Take 20 mg by mouth daily.    Historical Provider, MD  carvedilol (COREG) 12.5 MG tablet Take 1 tablet (12.5 mg total) by mouth 2 (two) times daily with a meal. 07/11/15   Silver Huguenin Elgergawy, MD  docusate sodium (COLACE) 100 MG capsule Take 2 capsules (200 mg total) by mouth 2 (two) times daily. 07/11/15   Silver Huguenin Elgergawy, MD  doxazosin (CARDURA) 4 MG tablet Take 4 mg by mouth 2 (two) times daily.     Historical Provider, MD  ferrous sulfate 325 (65 FE) MG tablet  Take 1 tablet (325 mg total) by mouth 3 (three) times daily with meals. 07/11/15   Silver Huguenin Elgergawy, MD  finasteride (PROSCAR) 5 MG tablet Take 5 mg by mouth daily. 11/20/12   Historical Provider, MD  furosemide (LASIX) 40 MG tablet Take 1 tablet (40 mg total) by mouth daily. 07/11/15   Silver Huguenin Elgergawy, MD  magnesium hydroxide (MILK OF MAGNESIA) 400 MG/5ML suspension Take 7.5 mLs by mouth at bedtime.    Historical Provider, MD  mirtazapine (REMERON) 15 MG tablet Take 15 mg by mouth at bedtime.    Historical Provider, MD  Multiple Vitamin (MULITIVITAMIN WITH MINERALS) TABS Take 1 tablet by mouth daily before breakfast.     Historical Provider, MD  mupirocin ointment (BACTROBAN) 2 % Place 1 application into the nose 2 (two) times daily. For 5 days 07/11/15   Albertine Patricia, MD  ondansetron (ZOFRAN) 4 MG tablet Take 1 tablet (4 mg total) by mouth every 6 (six) hours as needed for nausea. 07/11/15   Silver Huguenin Elgergawy, MD  oxyCODONE-acetaminophen (PERCOCET/ROXICET) 5-325 MG  tablet Take 1 tablet by mouth every 6 (six) hours as needed for moderate pain. 07/09/15   Loni Dolly, PA-C  polyethylene glycol (MIRALAX / GLYCOLAX) packet Take 17 g by mouth at bedtime.     Historical Provider, MD  senna (SENOKOT) 8.6 MG tablet Take 1 tablet by mouth 4 (four) times daily.    Historical Provider, MD  sertraline (ZOLOFT) 25 MG tablet Take 25 mg by mouth daily. 11/21/12   Historical Provider, MD  vitamin B-12 (CYANOCOBALAMIN) 1000 MCG tablet Take 1,000 mcg by mouth daily before breakfast.     Historical Provider, MD  vitamin C (ASCORBIC ACID) 500 MG tablet Take 500 mg by mouth daily before breakfast.     Historical Provider, MD    Allergies:   Allergies  Allergen Reactions  . Amoxicillin Swelling    Has patient had a PCN reaction causing immediate rash, facial/tongue/throat swelling, SOB or lightheadedness with hypotension: Unknown Has patient had a PCN reaction causing severe rash involving mucus membranes or skin necrosis: Unknown Has patient had a PCN reaction that required hospitalization: Unknown Has patient had a PCN reaction occurring within the last 10 years: Unknown If all of the above answers are "NO", then may proceed with Cephalosporin use.   Marland Kitchen Apresoline Esidrix [Hydralazine-Hctz] Itching and Swelling  . Hctz [Hydrochlorothiazide] Other (See Comments)    hyponatremia  . Tetracyclines & Related   . Levaquin [Levofloxacin In D5w]     Unknown reaction  . Serzone [Nefazodone] Other (See Comments)    Felt bad  . Biaxin [Clarithromycin] Rash  . Other Itching    CT CONTRAST DYE    Social History:  Ambulatory  wheelchair bound or bed bound Lives at home With family was discharged from nursing home facility secondary to lack of insurance    reports that he quit smoking about 43 years ago. He has never used smokeless tobacco. He reports that he does not drink alcohol or use illicit drugs.    Family History: family history includes Cancer in his sister; Heart  disease in his father.    Physical Exam: Patient Vitals for the past 24 hrs:  BP Temp Temp src Pulse Resp SpO2 Height Weight  08/30/15 2327 - - - - - - 5\' 9"  (1.753 m) 83.915 kg (185 lb)  08/30/15 2320 (!) 112/51 mmHg - - 75 11 94 % - -  08/30/15 2309 (!) 70/40 mmHg - - - - - - -  08/30/15 2145 (!) 100/45 mmHg 98.5 F (36.9 C) Axillary 68 18 97 % - -  08/30/15 2142 - - - - - 95 % - -    1. General:  in No Acute distress 2. Psychological: Alert but not Oriented 3. Head/ENT:    Dry Mucous Membranes                          Head Non traumatic, neck supple                         Poor Dentition 4. SKIN: decreased Skin turgor,  Skin clean Dry large stage IV sacral decubitus      5. Heart: Regular rate and rhythm no Murmur, Rub or gallop 6. Lungs:Clear to auscultation bilaterally, no wheezes or crackles   7. Abdomen: Soft, non-tender, Non distended 8. Lower extremities: no clubbing, cyanosis, or edema 9. Neurologically Grossly intact, moving all 4 extremities equally 10. MSK: Normal range of motion  body mass index is 27.31 kg/(m^2).   Labs on Admission:   Results for orders placed or performed during the hospital encounter of 08/30/15 (from the past 24 hour(s))  I-Stat CG4 Lactic Acid, ED (Not at [facility])     Status: None   Collection Time: 08/30/15 10:13 PM  Result Value Ref Range   Lactic Acid, Venous 1.20 0.5 - 2.0 mmol/L  POC occult blood, ED     Status: None   Collection Time: 08/30/15 10:17 PM  Result Value Ref Range   Fecal Occult Bld NEGATIVE NEGATIVE  Comprehensive metabolic panel     Status: Abnormal   Collection Time: 08/30/15 10:39 PM  Result Value Ref Range   Sodium 137 135 - 145 mmol/L   Potassium 4.6 3.5 - 5.1 mmol/L   Chloride 100 (L) 101 - 111 mmol/L   CO2 21 (L) 22 - 32 mmol/L   Glucose, Bld 136 (H) 65 - 99 mg/dL   BUN 82 (H) 6 - 20 mg/dL   Creatinine, Ser 2.83 (H) 0.61 - 1.24 mg/dL   Calcium 8.4 (L) 8.9 - 10.3 mg/dL   Total Protein 6.4 (L) 6.5 - 8.1  g/dL   Albumin 2.6 (L) 3.5 - 5.0 g/dL   AST 29 15 - 41 U/L   ALT 24 17 - 63 U/L   Alkaline Phosphatase 60 38 - 126 U/L   Total Bilirubin 1.0 0.3 - 1.2 mg/dL   GFR calc non Af Amer 18 (L) >60 mL/min   GFR calc Af Amer 21 (L) >60 mL/min   Anion gap 16 (H) 5 - 15  CBC with Differential     Status: Abnormal   Collection Time: 08/30/15 10:39 PM  Result Value Ref Range   WBC 12.6 (H) 4.0 - 10.5 K/uL   RBC 3.74 (L) 4.22 - 5.81 MIL/uL   Hemoglobin 9.5 (L) 13.0 - 17.0 g/dL   HCT 30.3 (L) 39.0 - 52.0 %   MCV 81.0 78.0 - 100.0 fL   MCH 25.4 (L) 26.0 - 34.0 pg   MCHC 31.4 30.0 - 36.0 g/dL   RDW 16.3 (H) 11.5 - 15.5 %   Platelets 260 150 - 400 K/uL   Neutrophils Relative % 88 %   Lymphocytes Relative 8 %   Monocytes Relative 4 %   Eosinophils Relative 0 %   Basophils Relative 0 %   Neutro Abs 11.1 (H) 1.7 - 7.7 K/uL   Lymphs Abs 1.0  0.7 - 4.0 K/uL   Monocytes Absolute 0.5 0.1 - 1.0 K/uL   Eosinophils Absolute 0.0 0.0 - 0.7 K/uL   Basophils Absolute 0.0 0.0 - 0.1 K/uL   WBC Morphology VACUOLATED NEUTROPHILS   Urinalysis, Routine w reflex microscopic (not at [facility])     Status: Abnormal   Collection Time: 08/30/15 10:40 PM  Result Value Ref Range   Color, Urine AMBER (A) YELLOW   APPearance TURBID (A) CLEAR   Specific Gravity, Urine 1.019 1.005 - 1.030   pH 5.0 5.0 - 8.0   Glucose, UA NEGATIVE NEGATIVE mg/dL   Hgb urine dipstick MODERATE (A) NEGATIVE   Bilirubin Urine SMALL (A) NEGATIVE   Ketones, ur NEGATIVE NEGATIVE mg/dL   Protein, ur 100 (A) NEGATIVE mg/dL   Nitrite NEGATIVE NEGATIVE   Leukocytes, UA LARGE (A) NEGATIVE  Urine microscopic-add on     Status: Abnormal   Collection Time: 08/30/15 10:40 PM  Result Value Ref Range   Squamous Epithelial / LPF NONE SEEN NONE SEEN   WBC, UA 6-30 0 - 5 WBC/hpf   RBC / HPF 0-5 0 - 5 RBC/hpf   Bacteria, UA MANY (A) NONE SEEN   Casts HYALINE CASTS (A) NEGATIVE   Urine-Other LESS THAN 10 mL OF URINE SUBMITTED     UA evidence of UTI this  is colonization given indwelling Foley  Lab Results  Component Value Date   HGBA1C 6.4* 07/07/2015    Estimated Creatinine Clearance: 16.7 mL/min (by C-G formula based on Cr of 2.83).  BNP (last 3 results) No results for input(s): PROBNP in the last 8760 hours.  Other results:  I have pearsonaly reviewed this: ECG REPORT not obtained  [facility] Weights   08/30/15 2327  Weight: 83.915 kg (185 lb)     Cultures:    Component Value Date/Time   SDES SPUTUM 05/27/2012 1059   SDES SPUTUM 05/27/2012 1059   SPECREQUEST Normal 05/27/2012 1059   SPECREQUEST NONE 05/27/2012 1059   CULT NORMAL OROPHARYNGEAL FLORA 05/27/2012 1059   REPTSTATUS 05/27/2012 FINAL 05/27/2012 1059   REPTSTATUS 05/29/2012 FINAL 05/27/2012 1059     Radiological Exams on Admission: Dg Chest Port 1 View  08/30/2015  CLINICAL DATA:  Cough and weakness EXAM: PORTABLE CHEST 1 VIEW COMPARISON:  07/07/2015 FINDINGS: No cardiomegaly.  Negative aortic and hilar contours. Chronic interstitial coarsening. There is no edema, consolidation, effusion, or pneumothorax. Re- demonstrated now 37 mm nodule over the right lung. Symmetric biapical pleural scarring/thickening. IMPRESSION: 1. No acute finding. 2. 37 mm right lung nodule primarily concerning for carcinoma. Recommend follow-up chest CT (contrast not essential in this patient with stage 4 chronic kidney disease). 3. Chronic lung disease. Electronically Signed   By: Monte Fantasia M.D.   On: 08/30/2015 22:51    Chart has been reviewed  Family   at  Bedside  plan of care was discussed with   Wife   Assessment/Plan  48 year old gentleman with history of hypertension, chronic kidney disease, coronary artery disease, diabetes mellitus and heart failure here with hypertension in a setting of dehydration from nausea vomiting diarrhea for the past 24 hours as well as stage IV sacral decubitus with evidence of superficial infection and questionable UTI  Present on Admission:  .  Hypotension was likely secondary to dehydration improved with IV fluids . HTN (hypertension) currently hypotensive we'll hold home medications  . CAD (coronary artery disease) stable continue home medications  . Lung nodule - discussed at length with wife at this point states  patient would not be interested in undergoing chemotherapy or pursuing a biopsy will continue with observation. Given overall progressive decrease in functional status would benefit from palliative care consult for evaluation of goals of care  . Decubitus ulcer of sacral region, stage 4 ([place]) wound care consult he may benefit from wound VAC placement  Suspect sacral osteomyelitis given ability to palpate bone currently does not appear to have foul discharge. Given possibility of C. difficile will hold off on antibiotics for now as patient does not toxic appearing blood pressure improved with IV fluids . UTI (lower urinary tract infection) versus colonization obtain urine culture exchange Foley catheter patient has healing ulcer and has had urinary retention a past  . Dehydration . Secondary to nausea vomiting diarrhea will rehydrate  . Anemia of chronic disease currently at baseline no history of bleeding  . Acute on chronic renal failure (HCC) in the setting of dehydration will monitor for improvement after fluid administration  . DM type 2 causing CKD stage 5 (HCC) order sliding scale monitor blood sugar   nausea vomiting diarrhea likely gastroenteritis will obtain stool studies as well as evaluate for C. difficile. Patient has been exposed to antibiotics for recurrent UTI in the setting of indwelling foley catheter. Given persistent diarrhea We will order rectal tube to help with skin healing   Prophylaxis:  Lovenox   CODE STATUS:   DNR/DNI as per family, no aggressive intervention concentrate on healing sacral decubitus and comfort. Would benefit from palliative care consult to continue evaluate goals of care    Disposition:  likely will need placement for rehabilitation                             Other plan as per orders.  I have spent a total of 85min on this admission   Cyndi Montejano 08/31/2015, 12:49 AM    [facility]  Pager [phone]   after 2 AM please page floor coverage PA If 7AM-7PM, please contact the day team taking care of the patient  [email or web address]  Password [email or web address]

## 2015-08-30 NOTE — ED Provider Notes (Signed)
CSN: [id number]     Arrival date & time 08/30/15  2132 History   First MD Initiated Contact with Patient 08/30/15 2212     Chief Complaint  Patient presents with  . Weakness      HPI  Patient presents from home accompanied by his wife with complaint of nausea vomiting diarrhea and weakness today.  Patient was admitted in January with a hip fracture. Left on January 20 2 rehabilitation facility. Was discharged from the rehabilitation facility back home with his wife on Tuesday. Today, Saturday he had nausea vomiting and diarrhea all day and was weak. She called 911.  His wife states that he has a bedsore. She states that it looked infected to her. States she was told by the nurse when he was leaving rehabilitation facility" looked fine". Is been draining since he arrived home. He is not currently on an antibiotic. His wife states that he was on an unknown antibiotic for reason unknown to her until last week in the care facility.  He's not had fever that she is aware of, although did not check his temperature at home. No blood in his emesis, or his stools. He has had some output through his Foley catheter at home today.  She states that when he eats he coughs and chokes. He has had more of a spontaneous cough.  She states he has been more confused than normal today  Past Medical History  Diagnosis Date  . CKD (chronic kidney disease) stage 4, GFR 15-29 ml/min (HCC)   . Type 2 diabetes mellitus with peripheral neuropathy (HCC)   . Hypertension   . MI (myocardial infarction) ([place])     1974  . Peripheral vascular disease ([place])     veinsi rerouted in both legs   . Pneumonia     hx of   . Anemia   . Blood transfusion     hx of 6 months ago   . GERD (gastroesophageal reflux disease)   . Arthritis   . Bowel obstruction ([place])   . Gait disorder   . Depression   . Dyslipidemia   . C. difficile colitis   . History of GI bleed     Upper  . Lumbosacral spinal stenosis   . Memory  deficit 11/29/2012  . Degenerative arthritis   . Peptic ulcer disease    Past Surgical History  Procedure Laterality Date  . Lumbar laminectomy      back surgery x 2   . Eye surgery      bilateral cataract surgery with implants   . Hemorroidectomy    . Carpal tunnel release Right   . Other surgical history      big toe nails removed   . Other surgical history      bilateral kidney stents  2007  . Other surgical history      small bowel obstruction 2011   . Excision/release bursa hip  08/13/2011    Procedure: EXCISION/RELEASE BURSA HIP;  Surgeon: Tobi Bastos, MD;  Location: [facility];  Service: Orthopedics;  Laterality: Right;  Right Hip Excision Tumor  . Esophageal manometry  11/08/2011    Procedure: ESOPHAGEAL MANOMETRY (EM);  Surgeon: Beryle Beams, MD;  Location: [facility];  Service: Endoscopy;  Laterality: N/A;  . Cataract extraction Bilateral   . Lipoma resection    . Transurethral resection of prostate      For benign prostate hypertrophy  . Carpal tunnel release Bilateral   . Ureteral stent  placement Bilateral   . Cervical laminectomy    . Skin cancer resection      Left forehead  . Femoral-popliteal bypass graft Left 07-08-1997    Curt Jews MD  . Femoral-popliteal bypass graft Right 06-04-1997    Curt Jews MD  . Carpal tunnel release Left 11/20/2013    Procedure: LEFT  CARPAL TUNNEL RELEASE;  Surgeon: Wynonia Sours, MD;  Location: [facility];  Service: Orthopedics;  Laterality: Left;  . Hypothenar fat pad transfer Left 11/20/2013    Procedure: HYPOTHENAR FAT PAD TRANSFER LEFT;  Surgeon: Wynonia Sours, MD;  Location: [facility];  Service: Orthopedics;  Laterality: Left;  . Hip arthroplasty Left 07/08/2015    Procedure: LEFT  HIP HEMIARTHROPLASTY;  Surgeon: Melrose Nakayama, MD;  Location: [facility];  Service: Orthopedics;  Laterality: Left;   Family History  Problem Relation Age of Onset  . Cancer Sister   . Heart disease Father    Social  History  Substance Use Topics  . Smoking status: Former Smoker    Quit date: 07/22/1972  . Smokeless tobacco: Never Used  . Alcohol Use: No    Review of Systems  Unable to perform ROS: Mental status change      Allergies  Amoxicillin; Apresoline esidrix; Hctz; Tetracyclines & related; Levaquin; Serzone; Biaxin; and Other  Home Medications   Prior to Admission medications   Medication Sig Start Date End Date Taking? Authorizing Provider  aspirin 325 MG EC tablet Take 1 tablet (325 mg total) by mouth 2 (two) times daily at 8 am and 10 pm. Please take total of 4 weeks then stop 07/11/15   Albertine Patricia, MD  atorvastatin (LIPITOR) 20 MG tablet Take 20 mg by mouth daily.    Historical Provider, MD  carvedilol (COREG) 12.5 MG tablet Take 1 tablet (12.5 mg total) by mouth 2 (two) times daily with a meal. 07/11/15   Silver Huguenin Elgergawy, MD  docusate sodium (COLACE) 100 MG capsule Take 2 capsules (200 mg total) by mouth 2 (two) times daily. 07/11/15   Silver Huguenin Elgergawy, MD  doxazosin (CARDURA) 4 MG tablet Take 4 mg by mouth 2 (two) times daily.     Historical Provider, MD  ferrous sulfate 325 (65 FE) MG tablet Take 1 tablet (325 mg total) by mouth 3 (three) times daily with meals. 07/11/15   Silver Huguenin Elgergawy, MD  finasteride (PROSCAR) 5 MG tablet Take 5 mg by mouth daily. 11/20/12   Historical Provider, MD  furosemide (LASIX) 40 MG tablet Take 1 tablet (40 mg total) by mouth daily. 07/11/15   Silver Huguenin Elgergawy, MD  magnesium hydroxide (MILK OF MAGNESIA) 400 MG/5ML suspension Take 7.5 mLs by mouth at bedtime.    Historical Provider, MD  mirtazapine (REMERON) 15 MG tablet Take 15 mg by mouth at bedtime.    Historical Provider, MD  Multiple Vitamin (MULITIVITAMIN WITH MINERALS) TABS Take 1 tablet by mouth daily before breakfast.     Historical Provider, MD  mupirocin ointment (BACTROBAN) 2 % Place 1 application into the nose 2 (two) times daily. For 5 days 07/11/15   Albertine Patricia, MD   ondansetron (ZOFRAN) 4 MG tablet Take 1 tablet (4 mg total) by mouth every 6 (six) hours as needed for nausea. 07/11/15   Silver Huguenin Elgergawy, MD  oxyCODONE-acetaminophen (PERCOCET/ROXICET) 5-325 MG tablet Take 1 tablet by mouth every 6 (six) hours as needed for moderate pain. 07/09/15   Loni Dolly, PA-C  polyethylene  glycol (MIRALAX / GLYCOLAX) packet Take 17 g by mouth at bedtime.     Historical Provider, MD  senna (SENOKOT) 8.6 MG tablet Take 1 tablet by mouth 4 (four) times daily.    Historical Provider, MD  sertraline (ZOLOFT) 25 MG tablet Take 25 mg by mouth daily. 11/21/12   Historical Provider, MD  vitamin B-12 (CYANOCOBALAMIN) 1000 MCG tablet Take 1,000 mcg by mouth daily before breakfast.     Historical Provider, MD  vitamin C (ASCORBIC ACID) 500 MG tablet Take 500 mg by mouth daily before breakfast.     Historical Provider, MD   BP 112/51 mmHg  Pulse 75  Temp(Src) 98.5 F (36.9 C) (Axillary)  Resp 11  Ht 5\' 9"  (1.753 m)  Wt 185 lb (83.915 kg)  BMI 27.31 kg/m2  SpO2 94% Physical Exam  Constitutional: He appears cachectic.  He is awake and alert. He does appear ill. Does appear cachectic and pale  HENT:  Tongue is leathery dry  Eyes:  Conjunctivae are pale. Pupils equal and reactive  Cardiovascular:  Not tachycardic.  Pulmonary/Chest:  Diffuse scattered rhonchi and weak cough  Abdominal:  Soft abdomen. Normal active bowel sounds. He has brown stool in his diaper, guaiac negative  Neurological: He is alert.  Confused. Moving all 4.  large midline sacral decubitus with purulence and visible sacrum  ED Course  Procedures (including critical care time) Labs Review Labs Reviewed  COMPREHENSIVE METABOLIC PANEL - Abnormal; Notable for the following:    Chloride 100 (*)    CO2 21 (*)    Glucose, Bld 136 (*)    BUN 82 (*)    Creatinine, Ser 2.83 (*)    Calcium 8.4 (*)    Total Protein 6.4 (*)    Albumin 2.6 (*)    GFR calc non Af Amer 18 (*)    GFR calc Af Amer 21 (*)     Anion gap 16 (*)    All other components within normal limits  CBC WITH DIFFERENTIAL/PLATELET - Abnormal; Notable for the following:    WBC 12.6 (*)    RBC 3.74 (*)    Hemoglobin 9.5 (*)    HCT 30.3 (*)    MCH 25.4 (*)    RDW 16.3 (*)    Neutro Abs 11.1 (*)    All other components within normal limits  URINALYSIS, ROUTINE W REFLEX MICROSCOPIC (NOT AT [facility]) - Abnormal; Notable for the following:    Color, Urine AMBER (*)    APPearance TURBID (*)    Hgb urine dipstick MODERATE (*)    Bilirubin Urine SMALL (*)    Protein, ur 100 (*)    Leukocytes, UA LARGE (*)    All other components within normal limits  URINE MICROSCOPIC-ADD ON - Abnormal; Notable for the following:    Bacteria, UA MANY (*)    Casts HYALINE CASTS (*)    All other components within normal limits  CULTURE, BLOOD (ROUTINE X 2)  CULTURE, BLOOD (ROUTINE X 2)  URINE CULTURE  C DIFFICILE QUICK SCREEN W PCR REFLEX  OCCULT BLOOD X 1 CARD TO LAB, STOOL  I-STAT CG4 LACTIC ACID, ED  POC OCCULT BLOOD, ED    Imaging Review Dg Chest Port 1 View  08/30/2015  CLINICAL DATA:  Cough and weakness EXAM: PORTABLE CHEST 1 VIEW COMPARISON:  07/07/2015 FINDINGS: No cardiomegaly.  Negative aortic and hilar contours. Chronic interstitial coarsening. There is no edema, consolidation, effusion, or pneumothorax. Re- demonstrated now 37 mm nodule over the  right lung. Symmetric biapical pleural scarring/thickening. IMPRESSION: 1. No acute finding. 2. 37 mm right lung nodule primarily concerning for carcinoma. Recommend follow-up chest CT (contrast not essential in this patient with stage 4 chronic kidney disease). 3. Chronic lung disease. Electronically Signed   By: Monte Fantasia M.D.   On: 08/30/2015 22:51   I have personally reviewed and evaluated these images and lab results as part of my medical decision-making.   EKG Interpretation None      MDM   Final diagnoses:  Dehydration  Acute kidney injury (HCC)  Hypotension,  unspecified hypotension type  Sacral decubitus ulcer, stage IV (HCC)    Blood pressure improves with first liter. After 1500 mL is 110/70. He is more alert. Blood cultures are obtained. Does not have lactic acidosis. Urine appears infected. Elevation of V2 and 82 creatinine 2.3 consistent with dehydration and acute kidney injury. Leukocytosis 12.6. Hemoglobin 9.5.  C. Difficile is pending. Fecal occult blood is negative. Chest x-ray does not show infiltrate or effusion. I placed a call hospitalist to discuss admission.  I discussed the patient's DO NOT RESUSCITATE status at length with the wife. She has attempted to discuss this with the patient. Wife does have power of attorney for the patient. She states that they have discussed this at home before and that he has stated he would "not want to be on a machine". When asked tonight by the wife twice if he would want CPR if his heart stops he states "no". Patient will be made a DO NOT RESUSCITATE at the request of himself, and his wife who is power of attorney.    Tanna Furry, MD 08/30/15 2342

## 2015-08-30 NOTE — ED Notes (Signed)
Per EMS pt recently released from rehab facility d/t hip fx in January.  Per pt's family pt has been lethargic, AMS and weakness.

## 2015-08-30 NOTE — ED Notes (Signed)
Bed: [id number] Expected date:  Expected time:  Means of arrival:  Comments: EMS 89M lethargy

## 2015-08-31 DIAGNOSIS — L89154 Pressure ulcer of sacral region, stage 4: Secondary | ICD-10-CM

## 2015-08-31 DIAGNOSIS — E1142 Type 2 diabetes mellitus with diabetic polyneuropathy: Secondary | ICD-10-CM | POA: Diagnosis present

## 2015-08-31 DIAGNOSIS — N183 Chronic kidney disease, stage 3 (moderate): Secondary | ICD-10-CM | POA: Diagnosis not present

## 2015-08-31 DIAGNOSIS — D638 Anemia in other chronic diseases classified elsewhere: Secondary | ICD-10-CM | POA: Diagnosis present

## 2015-08-31 DIAGNOSIS — R8281 Pyuria: Secondary | ICD-10-CM | POA: Insufficient documentation

## 2015-08-31 DIAGNOSIS — I509 Heart failure, unspecified: Secondary | ICD-10-CM | POA: Diagnosis present

## 2015-08-31 DIAGNOSIS — I252 Old myocardial infarction: Secondary | ICD-10-CM | POA: Diagnosis not present

## 2015-08-31 DIAGNOSIS — A0472 Enterocolitis due to Clostridium difficile, not specified as recurrent: Secondary | ICD-10-CM | POA: Diagnosis present

## 2015-08-31 DIAGNOSIS — A047 Enterocolitis due to Clostridium difficile: Principal | ICD-10-CM

## 2015-08-31 DIAGNOSIS — R911 Solitary pulmonary nodule: Secondary | ICD-10-CM

## 2015-08-31 DIAGNOSIS — E860 Dehydration: Secondary | ICD-10-CM

## 2015-08-31 DIAGNOSIS — Z7189 Other specified counseling: Secondary | ICD-10-CM

## 2015-08-31 DIAGNOSIS — M4628 Osteomyelitis of vertebra, sacral and sacrococcygeal region: Secondary | ICD-10-CM | POA: Diagnosis present

## 2015-08-31 DIAGNOSIS — I2510 Atherosclerotic heart disease of native coronary artery without angina pectoris: Secondary | ICD-10-CM | POA: Diagnosis present

## 2015-08-31 DIAGNOSIS — I132 Hypertensive heart and chronic kidney disease with heart failure and with stage 5 chronic kidney disease, or end stage renal disease: Secondary | ICD-10-CM | POA: Diagnosis present

## 2015-08-31 DIAGNOSIS — Z66 Do not resuscitate: Secondary | ICD-10-CM | POA: Diagnosis present

## 2015-08-31 DIAGNOSIS — N179 Acute kidney failure, unspecified: Secondary | ICD-10-CM

## 2015-08-31 DIAGNOSIS — L8990 Pressure ulcer of unspecified site, unspecified stage: Secondary | ICD-10-CM | POA: Diagnosis not present

## 2015-08-31 DIAGNOSIS — E785 Hyperlipidemia, unspecified: Secondary | ICD-10-CM | POA: Diagnosis present

## 2015-08-31 DIAGNOSIS — N185 Chronic kidney disease, stage 5: Secondary | ICD-10-CM | POA: Diagnosis present

## 2015-08-31 DIAGNOSIS — R5383 Other fatigue: Secondary | ICD-10-CM | POA: Diagnosis present

## 2015-08-31 DIAGNOSIS — N390 Urinary tract infection, site not specified: Secondary | ICD-10-CM | POA: Diagnosis present

## 2015-08-31 DIAGNOSIS — Z515 Encounter for palliative care: Secondary | ICD-10-CM | POA: Diagnosis not present

## 2015-08-31 DIAGNOSIS — Z87891 Personal history of nicotine dependence: Secondary | ICD-10-CM | POA: Diagnosis not present

## 2015-08-31 DIAGNOSIS — F0390 Unspecified dementia without behavioral disturbance: Secondary | ICD-10-CM | POA: Diagnosis present

## 2015-08-31 DIAGNOSIS — N189 Chronic kidney disease, unspecified: Secondary | ICD-10-CM

## 2015-08-31 DIAGNOSIS — K219 Gastro-esophageal reflux disease without esophagitis: Secondary | ICD-10-CM | POA: Diagnosis present

## 2015-08-31 DIAGNOSIS — I959 Hypotension, unspecified: Secondary | ICD-10-CM | POA: Diagnosis present

## 2015-08-31 DIAGNOSIS — R64 Cachexia: Secondary | ICD-10-CM | POA: Diagnosis present

## 2015-08-31 DIAGNOSIS — E1122 Type 2 diabetes mellitus with diabetic chronic kidney disease: Secondary | ICD-10-CM | POA: Diagnosis present

## 2015-08-31 LAB — COMPREHENSIVE METABOLIC PANEL
ALBUMIN: 2.5 g/dL — AB (ref 3.5–5.0)
ALT: 22 U/L (ref 17–63)
AST: 28 U/L (ref 15–41)
Alkaline Phosphatase: 52 U/L (ref 38–126)
Anion gap: 14 (ref 5–15)
BUN: 91 mg/dL — AB (ref 6–20)
CHLORIDE: 105 mmol/L (ref 101–111)
CO2: 21 mmol/L — ABNORMAL LOW (ref 22–32)
Calcium: 8.1 mg/dL — ABNORMAL LOW (ref 8.9–10.3)
Creatinine, Ser: 2.62 mg/dL — ABNORMAL HIGH (ref 0.61–1.24)
GFR calc Af Amer: 23 mL/min — ABNORMAL LOW (ref >60)
GFR calc non Af Amer: 20 mL/min — ABNORMAL LOW (ref >60)
Glucose, Bld: 139 mg/dL — ABNORMAL HIGH (ref 65–99)
POTASSIUM: 4.3 mmol/L (ref 3.5–5.1)
SODIUM: 140 mmol/L (ref 135–145)
Total Bilirubin: 0.7 mg/dL (ref 0.3–1.2)
Total Protein: 6 g/dL — ABNORMAL LOW (ref 6.5–8.1)

## 2015-08-31 LAB — GLUCOSE, CAPILLARY
GLUCOSE-CAPILLARY: 124 mg/dL — AB (ref 65–99)
GLUCOSE-CAPILLARY: 107 mg/dL — AB (ref 65–99)
Glucose-Capillary: 141 mg/dL — ABNORMAL HIGH (ref 65–99)

## 2015-08-31 LAB — CBC
HEMATOCRIT: 26.5 % — AB (ref 39.0–52.0)
HEMOGLOBIN: 8 g/dL — AB (ref 13.0–17.0)
MCH: 26 pg (ref 26.0–34.0)
MCHC: 30.2 g/dL (ref 30.0–36.0)
MCV: 86 fL (ref 78.0–100.0)
Platelets: 252 10*3/uL (ref 150–400)
RBC: 3.08 MIL/uL — ABNORMAL LOW (ref 4.22–5.81)
RDW: 16.7 % — AB (ref 11.5–15.5)
WBC: 10.7 10*3/uL — ABNORMAL HIGH (ref 4.0–10.5)

## 2015-08-31 LAB — GASTROINTESTINAL PANEL BY PCR, STOOL (REPLACES STOOL CULTURE)

## 2015-08-31 LAB — MAGNESIUM: Magnesium: 2.1 mg/dL (ref 1.7–2.4)

## 2015-08-31 LAB — SODIUM, URINE, RANDOM: Sodium, Ur: 10 mmol/L

## 2015-08-31 LAB — TSH: TSH: 1.222 u[IU]/mL (ref 0.350–4.500)

## 2015-08-31 LAB — SEDIMENTATION RATE: Sed Rate: 48 mm/hr — ABNORMAL HIGH (ref 0–16)

## 2015-08-31 LAB — I-STAT CG4 LACTIC ACID, ED: Lactic Acid, Venous: 0.86 mmol/L (ref 0.5–2.0)

## 2015-08-31 LAB — INFLUENZA PANEL BY PCR (TYPE A & B)
H1N1 flu by pcr: NOT DETECTED
INFLAPCR: NEGATIVE
Influenza B By PCR: NEGATIVE

## 2015-08-31 LAB — PHOSPHORUS: PHOSPHORUS: 4.4 mg/dL (ref 2.5–4.6)

## 2015-08-31 LAB — OCCULT BLOOD X 1 CARD TO LAB, STOOL: FECAL OCCULT BLD: NEGATIVE

## 2015-08-31 LAB — MRSA PCR SCREENING: MRSA BY PCR: POSITIVE — AB

## 2015-08-31 LAB — CREATININE, URINE, RANDOM: Creatinine, Urine: 103.46 mg/dL

## 2015-08-31 MED ORDER — SODIUM CHLORIDE 0.9 % IV SOLN
INTRAVENOUS | Status: DC
  Administered 2015-08-31: 1000 mL via INTRAVENOUS

## 2015-08-31 MED ORDER — MUPIROCIN 2 % EX OINT
TOPICAL_OINTMENT | CUTANEOUS | Status: AC
  Filled 2015-08-31: qty 22

## 2015-08-31 MED ORDER — CHLORHEXIDINE GLUCONATE 0.12 % MT SOLN
15.0000 mL | Freq: Two times a day (BID) | OROMUCOSAL | Status: DC
  Administered 2015-08-31 – 2015-09-02 (×4): 15 mL via OROMUCOSAL
  Filled 2015-08-31 (×4): qty 15

## 2015-08-31 MED ORDER — ONDANSETRON HCL 4 MG PO TABS: 4.0000 mg | ORAL_TABLET | Freq: Four times a day (QID) | ORAL | Status: DC | PRN

## 2015-08-31 MED ORDER — VANCOMYCIN HCL IN DEXTROSE 1-5 GM/200ML-% IV SOLN: 1000.0000 mg | INTRAVENOUS | Status: DC

## 2015-08-31 MED ORDER — MUPIROCIN 2 % EX OINT
1.0000 "application " | TOPICAL_OINTMENT | Freq: Two times a day (BID) | CUTANEOUS | Status: DC
  Administered 2015-08-31 – 2015-09-02 (×5): 1 via NASAL
  Filled 2015-08-31: qty 22

## 2015-08-31 MED ORDER — HYDROCODONE-ACETAMINOPHEN 5-325 MG PO TABS: 1.0000 | ORAL_TABLET | ORAL | Status: DC | PRN

## 2015-08-31 MED ORDER — ONDANSETRON HCL 4 MG/2ML IJ SOLN: 4.0000 mg | Freq: Four times a day (QID) | INTRAMUSCULAR | Status: DC | PRN

## 2015-08-31 MED ORDER — ACETAMINOPHEN 325 MG PO TABS: 650.0000 mg | ORAL_TABLET | Freq: Four times a day (QID) | ORAL | Status: DC | PRN

## 2015-08-31 MED ORDER — HYDROMORPHONE HCL 1 MG/ML IJ SOLN
0.5000 mg | INTRAMUSCULAR | Status: DC | PRN
  Administered 2015-09-01: 0.5 mg via INTRAVENOUS
  Filled 2015-08-31: qty 1

## 2015-08-31 MED ORDER — VANCOMYCIN 50 MG/ML ORAL SOLUTION
125.0000 mg | Freq: Four times a day (QID) | ORAL | Status: DC
  Administered 2015-08-31 – 2015-09-02 (×10): 125 mg via ORAL
  Filled 2015-08-31 (×14): qty 2.5

## 2015-08-31 MED ORDER — CHLORHEXIDINE GLUCONATE CLOTH 2 % EX PADS
6.0000 | MEDICATED_PAD | Freq: Every day | CUTANEOUS | Status: DC
  Administered 2015-08-31 – 2015-09-02 (×3): 6 via TOPICAL

## 2015-08-31 MED ORDER — LORAZEPAM 2 MG/ML IJ SOLN
1.0000 mg | INTRAMUSCULAR | Status: DC | PRN
  Administered 2015-09-01: 1 mg via INTRAVENOUS
  Filled 2015-08-31: qty 1

## 2015-08-31 MED ORDER — INSULIN ASPART 100 UNIT/ML ~~LOC~~ SOLN
0.0000 [IU] | SUBCUTANEOUS | Status: DC
  Administered 2015-08-31: 1 [IU] via SUBCUTANEOUS

## 2015-08-31 MED ORDER — ENOXAPARIN SODIUM 30 MG/0.3ML ~~LOC~~ SOLN
30.0000 mg | SUBCUTANEOUS | Status: DC
  Administered 2015-08-31: 30 mg via SUBCUTANEOUS
  Filled 2015-08-31 (×2): qty 0.3

## 2015-08-31 MED ORDER — CETYLPYRIDINIUM CHLORIDE 0.05 % MT LIQD
7.0000 mL | Freq: Two times a day (BID) | OROMUCOSAL | Status: DC
  Administered 2015-08-31: 7 mL via OROMUCOSAL

## 2015-08-31 MED ORDER — ACETAMINOPHEN 650 MG RE SUPP: 650.0000 mg | Freq: Four times a day (QID) | RECTAL | Status: DC | PRN

## 2015-08-31 NOTE — Progress Notes (Signed)
PROGRESS NOTE    [name]  [id number]  DOB: Mar 23, 1921  DOA: 08/30/2015 PCP: Horatio Pel, MD Outpatient Specialists:   Hospital course: 82 year old male with history of stage IV chronic kidney disease, DM 2 with peripheral neuropathy, HTN, CAD, PAD, GERD, HLD, possible dementia, chronic indwelling Foley catheter, large sacral decubitus ulcer, status post fall and left hip fracture surgery on 07/08/15, discharged to SNF for rehabilitation and returned home Tues of this week, presented to ED with complaints of progressive fatigue, nausea, vomiting and diarrhea. In the ED, patient hypotensive 70/40, acute on chronic kidney disease, anemia, UA suggestive of possible UTI, chest x-ray showed mass worrisome for malignancy. Admitted for further management. C. difficile +. Extensively discussed with spouse who indicates fairly rapid decline since fall mid January and has not ambulated since. Palliative care was consulted and patient transitioned to complete comfort care. Await residential hospice.  Assessment & Plan:   Principal Problem:   Enteritis due to Clostridium difficile - Continue oral vancomycin to help reduce diarrhea and hence a symptom management.  Active Problems:   Diabetes mellitus type II, controlled ([place]) - Patient has been transitioned to full comfort care on 3/12 and hence all medications nonessential to comfort were discontinued.    Lung nodule - no work up    Decubitus ulcer of sacral region, stage 4 ([place]) - comfort care    UTI (lower urinary tract infection) - DC Abx for this indication    Dehydration - d/t GI losses. Oral feeds for comfort    Anemia of chronic disease - No follow up or transfusion    Acute on chronic renal failure (HCC) - better    Hypotension - resolved.     DVT prophylaxis: None d/t full comfort care. Code Status: DO NOT RESUSCITATE Family Communication: Discussed with spouse at bedside. Disposition Plan: DC to  residential hospice when bed available.   Consultants:  Palliative care medicine  Procedures:  Chronic indwelling Foley catheter  Rectal tube  Antimicrobials:  Oral vancomycin   Subjective: Patient poor historian, minimal verbal. As per spouse at bedside, significantly better than yesterday-no nausea or vomiting, more alert and talking, has visual hallucinations. Steady decline since fall 07/07/15 and has not ambulated since.  Objective: Filed Vitals:   08/30/15 2327 08/31/15 0039 08/31/15 0320 08/31/15 0451  BP:  100/50 100/54 188/43  Pulse:   68 62  Temp:   98.7 F (37.1 C) 98.6 F (37 C)  TempSrc:   Axillary Axillary  Resp:   16 16  Height: 5\' 9"  (1.753 m)  5' 9.5" (1.765 m)   Weight: 83.915 kg (185 lb)  78.654 kg (173 lb 6.4 oz)   SpO2:   100% 100%    Intake/Output Summary (Last 24 hours) at 08/31/15 1418 Last data filed at 08/31/15 0900  Gross per 24 hour  Intake    540 ml  Output    325 ml  Net    215 ml   Filed Weights   08/30/15 2327 08/31/15 0320  Weight: 83.915 kg (185 lb) 78.654 kg (173 lb 6.4 oz)    Exam:  General exam: Pleasant elderly male, chronically ill-looking, lying comfortably supine in bed. Respiratory system: Clear. No increased work of breathing. Cardiovascular system: S1 & S2 heard, RRR. No JVD, murmurs, gallops, clicks or pedal edema. Gastrointestinal system: Abdomen is nondistended, soft and nontender. Normal bowel sounds heard. Indwelling Foley catheter. Central nervous system: Alert and oriented only to self. No focal neurological deficits. Extremities: Symmetric  5 x 5 power.   Data Reviewed: Basic Metabolic Panel:  Recent Labs Lab 08/30/15 2239 08/31/15 0456  NA 137 140  K 4.6 4.3  CL 100* 105  CO2 21* 21*  GLUCOSE 136* 139*  BUN 82* 91*  CREATININE 2.83* 2.62*  CALCIUM 8.4* 8.1*  MG  --  2.1  PHOS  --  4.4   Liver Function Tests:  Recent Labs Lab 08/30/15 2239 08/31/15 0456  AST 29 28  ALT 24 22  ALKPHOS  60 52  BILITOT 1.0 0.7  PROT 6.4* 6.0*  ALBUMIN 2.6* 2.5*   No results for input(s): LIPASE, AMYLASE in the last 168 hours. No results for input(s): AMMONIA in the last 168 hours. CBC:  Recent Labs Lab 08/30/15 2239 08/31/15 0456  WBC 12.6* 10.7*  NEUTROABS 11.1*  --   HGB 9.5* 8.0*  HCT 30.3* 26.5*  MCV 81.0 86.0  PLT 260 252   Cardiac Enzymes: No results for input(s): CKTOTAL, CKMB, CKMBINDEX, TROPONINI in the last 168 hours. BNP (last 3 results) No results for input(s): PROBNP in the last 8760 hours. CBG:  Recent Labs Lab 08/31/15 0328 08/31/15 0456 08/31/15 1135  GLUCAP 141* 124* 107*    Recent Results (from the past 240 hour(s))  C difficile quick scan w PCR reflex     Status: Abnormal   Collection Time: 08/30/15 10:39 PM  Result Value Ref Range Status   C Diff antigen POSITIVE (A) NEGATIVE Final   C Diff toxin POSITIVE (A) NEGATIVE Final    Comment: CRITICAL RESULT CALLED TO, READ BACK BY AND VERIFIED WITH: A.SHELL,RN AT 0034 ON 08/31/15 BY W.SHEA    C Diff interpretation Positive for toxigenic C. difficile  Final  Culture, blood (routine x 2)     Status: None (Preliminary result)   Collection Time: 08/30/15 10:41 PM  Result Value Ref Range Status   Specimen Description BLOOD LEFT HAND  Final   Special Requests   Final    BOTTLES DRAWN AEROBIC AND ANAEROBIC 6CC Performed at [facility]    Culture PENDING  Incomplete   Report Status PENDING  Incomplete  Culture, blood (routine x 2)     Status: None (Preliminary result)   Collection Time: 08/30/15 10:42 PM  Result Value Ref Range Status   Specimen Description BLOOD BLOOD LEFT WRIST  Final   Special Requests   Final    BOTTLES DRAWN AEROBIC AND ANAEROBIC 6CC Performed at [facility]    Culture PENDING  Incomplete   Report Status PENDING  Incomplete  Gastrointestinal Panel by PCR , Stool     Status: None   Collection Time: 08/31/15  4:26 AM  Result Value Ref Range Status    Campylobacter species NOT DETECTED NOT DETECTED Final   Plesimonas shigelloides NOT DETECTED NOT DETECTED Final   Salmonella species NOT DETECTED NOT DETECTED Final   Yersinia enterocolitica NOT DETECTED NOT DETECTED Final   Vibrio species NOT DETECTED NOT DETECTED Final   Vibrio cholerae NOT DETECTED NOT DETECTED Final   Enteroaggregative E coli (EAEC) NOT DETECTED NOT DETECTED Final   Enteropathogenic E coli (EPEC) NOT DETECTED NOT DETECTED Final   Enterotoxigenic E coli (ETEC) NOT DETECTED NOT DETECTED Final   Shiga like toxin producing E coli (STEC) NOT DETECTED NOT DETECTED Final   E. coli O157 NOT DETECTED NOT DETECTED Final   Shigella/Enteroinvasive E coli (EIEC) NOT DETECTED NOT DETECTED Final   Cryptosporidium NOT DETECTED NOT DETECTED Final   Cyclospora cayetanensis  NOT DETECTED NOT DETECTED Final   Entamoeba histolytica NOT DETECTED NOT DETECTED Final   Giardia lamblia NOT DETECTED NOT DETECTED Final   Adenovirus F40/41 NOT DETECTED NOT DETECTED Final   Astrovirus NOT DETECTED NOT DETECTED Final   Norovirus GI/GII NOT DETECTED NOT DETECTED Final   Rotavirus A NOT DETECTED NOT DETECTED Final   Sapovirus (I, II, IV, and V) NOT DETECTED NOT DETECTED Final  MRSA PCR Screening     Status: Abnormal   Collection Time: 08/31/15  6:10 AM  Result Value Ref Range Status   MRSA by PCR POSITIVE (A) NEGATIVE Final    Comment:        The GeneXpert MRSA Assay (FDA approved for NASAL specimens only), is one component of a comprehensive MRSA colonization surveillance program. It is not intended to diagnose MRSA infection nor to guide or monitor treatment for MRSA infections. RESULT CALLED TO, READ BACK BY AND VERIFIED WITH: K OSBOURNE AT [id number] ON 03.12.2017 BY NBROOKS          Studies: Dg Chest Port 1 View  08/30/2015  CLINICAL DATA:  Cough and weakness EXAM: PORTABLE CHEST 1 VIEW COMPARISON:  07/07/2015 FINDINGS: No cardiomegaly.  Negative aortic and hilar contours. Chronic  interstitial coarsening. There is no edema, consolidation, effusion, or pneumothorax. Re- demonstrated now 37 mm nodule over the right lung. Symmetric biapical pleural scarring/thickening. IMPRESSION: 1. No acute finding. 2. 37 mm right lung nodule primarily concerning for carcinoma. Recommend follow-up chest CT (contrast not essential in this patient with stage 4 chronic kidney disease). 3. Chronic lung disease. Electronically Signed   By: Monte Fantasia M.D.   On: 08/30/2015 22:51        Scheduled Meds: . antiseptic oral rinse  7 mL Mouth Rinse q12n4p  . chlorhexidine  15 mL Mouth Rinse BID  . Chlorhexidine Gluconate Cloth  6 each Topical Q0600  . mupirocin ointment  1 application Nasal BID  . vancomycin  125 mg Oral QID   Continuous Infusions:    Principal Problem:   Enteritis due to Clostridium difficile Active Problems:   Diabetes mellitus type II, controlled ([place])   Lung nodule   Decubitus ulcer of sacral region, stage 4 (HCC)   UTI (lower urinary tract infection)   Dehydration   Anemia of chronic disease   Acute on chronic renal failure (HCC)   DM type 2 causing CKD stage 5 (HCC)   Hypotension   Sacral osteomyelitis ([place])   Encounter for palliative care   Goals of care, counseling/discussion    Time spent: 25 minutes.    Vernell Leep, MD, FACP, FHM. [facility] Pager [phone]  If 7PM-7AM, please contact night-coverage [email or web address] Password [email or web address] 08/31/2015, 2:18 PM    LOS: 0 days

## 2015-08-31 NOTE — Progress Notes (Signed)
Nursing Note: Pt arrived via stretcher.Confused,wife at bedside to assist w/ admission process.Pt resting quietly in bed.Bed alrm set.wbb

## 2015-08-31 NOTE — Consult Note (Signed)
Consultation Note Date: 08/31/2015   Patient Name: [name]  DOB: [birth year]  MRN: [id number]  Age / Sex: 82 y.o., male  PCP: Deland Pretty, MD Referring Physician: Modena Jansky, MD  Reason for Consultation: Establishing goals of care    Clinical Assessment/Narrative:  Patient is an 82 year old gentleman with a past medical history significant for hypertension, stage III chronic kidney disease coronary artery disease diabetes mellitus history of heart failure history of hypertension. The patient's wife is the primary historian present at the bedside. She states that she has been married to the patient for the last 34 years. The don't have children together. Patient does not have children. Patient has a sister who is elderly and lives in [place]. Patient has several nieces and nephews who live in the area. Ms. Vickii Chafe the patient's wife has a son.  Brief life review performed. Patient's wife describes him as a walker. He was used to walking at least 2-3 miles a day. He has not been able to walk since his hip fracture in January although he did participate some in rehabilitation. Overall, since January 2017, the patient has continued to decline. He has a severe sacral decubitus. He has been admitted this time with nausea vomiting diarrhea and possible urinary tract infection. Likely with superimposed infection due to sacral decubitus. Patient is also C. difficile positive in this hospitalization. Additional workup has also alluded to the presence of a lung nodule.  Palliative care consult her for goals of care discussions. Patient seen and examined, but with the patient's wife Mrs. Turner at the bedside. Patient does not appear to verbalize much. He opens his eyes when his name is called. Otherwise is pretty much nonverbal and not able to participate in history taking or discussions. He ate about 20-30 percent of his  breakfast, has declined lunch. He has been pulling his oxygen out and appears mildly confused at times.  Discussed with Mrs. Handrich. Introduced hospice services. At this time, she elects for full scope of comfort measures. We will notify case management/social work for residential hospice arrangements in a.m. 3-13. Otherwise, will discontinue antibiotics, discontinue labs and imaging at this time to focus exclusively on comfort care. Continue oral vancomycin for management of diarrhea for Clostridium difficile as a symptom management measure. Discussed with Dr. Algis Liming.   Contacts/Participants in Discussion: Primary Decision Maker:     Relationship to Patient  wife Cathal Reitenbach at [phone] HCPOA: yes     SUMMARY OF RECOMMENDATIONS: DO NOT RESUSCITATE/DO NOT INTUBATE Comfort measures only Discontinue antibiotics next and continue oral vancomycin as a comfort measure for diarrhea- Clostridium difficile Social worker consult for residential hospice placement. Patient lives in [place], [place] with his wife recently discharged from nursing facility. When necessary symptom management with IV Dilaudid when necessary and Ativan IV when necessary, prognosis days discussed with wife. Discontinue labs and imaging to focus exclusively on comfort care at this time, discussed extensively with wife Vickii Chafe who is agreeable    Code Status/Advance Care Planning: DNR    Code Status Orders        Start     Ordered   08/31/15 0142  Do not attempt resuscitation (DNR)   Continuous    Question Answer Comment  In the event of cardiac or respiratory ARREST Do not call a "code blue"   In the event of cardiac or respiratory ARREST Do not perform Intubation, CPR, defibrillation or ACLS   In the event of cardiac or respiratory ARREST Use medication  by any route, position, wound care, and other measures to relive pain and suffering. May use oxygen, suction and manual treatment of airway obstruction  as needed for comfort.      08/31/15 0141    Code Status History    Date Active Date Inactive Code Status Order ID Comments User Context   07/08/2015 12:42 PM 07/11/2015  9:48 PM Full Code [id number]  Loni Dolly, PA-C Inpatient   07/07/2015 10:04 AM 07/08/2015 12:42 PM Full Code [id number]  Reyne Dumas, MD ED   05/24/2012  3:52 PM 05/29/2012  7:42 PM Full Code [id number]  Monika Salk, MD ED   08/13/2011 11:28 AM 08/15/2011  1:57 PM Full Code [id number]  Madilyn Fireman, RN Inpatient    Advance Directive Documentation        Most Recent Value   Type of Advance Directive  Healthcare Power of [place] [DNR]   Pre-existing out of facility DNR order (yellow form or pink MOST form)     "MOST" Form in Place?        Other Directives:Other  Symptom Management:    As above  Palliative Prophylaxis:   Delirium Protocol  Additional Recommendations (Limitations, Scope, Preferences):  Full Comfort Care     Psycho-social/Spiritual:  Support System: [place] Desire for further Chaplaincy support:no Additional Recommendations: Caregiving  Support/Resources  Prognosis: Days versus < 2 weeks  Discharge Planning: Hospice facility versus anticipated hospital death   Chief Complaint/ Primary Diagnoses: Present on Admission:  . HTN (hypertension) . CKD (chronic kidney disease) stage 3, GFR 30-59 ml/min . CAD (coronary artery disease) . Lung nodule . Decubitus ulcer of sacral region, stage 4 ([place]) . UTI (lower urinary tract infection) . Dehydration . Anemia of chronic disease . Acute on chronic renal failure ([place]) . DM type 2 causing CKD stage 5 ([place]) . Hypotension . Sacral osteomyelitis ([place])  I have reviewed the medical record, interviewed the patient and family, and examined the patient. The following aspects are pertinent.  Past Medical History  Diagnosis Date  . CKD (chronic kidney disease) stage 4, GFR 15-29 ml/min (HCC)   . Type 2 diabetes mellitus with peripheral neuropathy (HCC)   .  Hypertension   . MI (myocardial infarction) ([place])     1974  . Peripheral vascular disease ([place])     veinsi rerouted in both legs   . Pneumonia     hx of   . Anemia   . Blood transfusion     hx of 6 months ago   . GERD (gastroesophageal reflux disease)   . Arthritis   . Bowel obstruction ([place])   . Gait disorder   . Depression   . Dyslipidemia   . C. difficile colitis   . History of GI bleed     Upper  . Lumbosacral spinal stenosis   . Memory deficit 11/29/2012  . Degenerative arthritis   . Peptic ulcer disease    Social History   Social History  . Marital Status: Married    Spouse Name: N/A  . Number of Children: 0  . Years of Education: 7th grade   Occupational History  . Retired    Social History Main Topics  . Smoking status: Former Smoker    Quit date: 07/22/1972  . Smokeless tobacco: Never Used  . Alcohol Use: No  . Drug Use: No  . Sexual Activity: No   Other Topics Concern  . None   Social History Narrative   Patient drinks 1-2 cups of caffeine  daily.   Patient is right handed.   Family History  Problem Relation Age of Onset  . Cancer Sister   . Heart disease Father    Scheduled Meds: . antiseptic oral rinse  7 mL Mouth Rinse q12n4p  . chlorhexidine  15 mL Mouth Rinse BID  . Chlorhexidine Gluconate Cloth  6 each Topical Q0600  . mupirocin ointment  1 application Nasal BID  . vancomycin  125 mg Oral QID   Continuous Infusions:  PRN Meds:.acetaminophen **OR** acetaminophen, HYDROmorphone (DILAUDID) injection, LORazepam, [DISCONTINUED] ondansetron **OR** ondansetron (ZOFRAN) IV Medications Prior to Admission:  Prior to Admission medications   Medication Sig Start Date End Date Taking? Authorizing Provider  aspirin EC 81 MG tablet Take 81 mg by mouth daily.   Yes Historical Provider, MD  atorvastatin (LIPITOR) 20 MG tablet Take 20 mg by mouth daily.   Yes Historical Provider, MD  carvedilol (COREG) 12.5 MG tablet Take 1 tablet (12.5 mg total) by  mouth 2 (two) times daily with a meal. Patient taking differently: Take 25 mg by mouth 2 (two) times daily with a meal.  07/11/15  Yes Albertine Patricia, MD  doxazosin (CARDURA) 4 MG tablet Take 4 mg by mouth 2 (two) times daily.    Yes Historical Provider, MD  ferrous sulfate 325 (65 FE) MG tablet Take 1 tablet (325 mg total) by mouth 3 (three) times daily with meals. Patient taking differently: Take 325 mg by mouth daily with breakfast.  07/11/15  Yes Albertine Patricia, MD  finasteride (PROSCAR) 5 MG tablet Take 5 mg by mouth daily. 11/20/12  Yes Historical Provider, MD  furosemide (LASIX) 40 MG tablet Take 1 tablet (40 mg total) by mouth daily. 07/11/15  Yes Silver Huguenin Elgergawy, MD  gabapentin (NEURONTIN) 100 MG capsule Take 100 mg by mouth 2 (two) times daily.   Yes Historical Provider, MD  losartan (COZAAR) 100 MG tablet Take 50 mg by mouth daily.   Yes Historical Provider, MD  magnesium hydroxide (MILK OF MAGNESIA) 400 MG/5ML suspension Take 7.5 mLs by mouth daily as needed.    Yes Historical Provider, MD  mirtazapine (REMERON) 15 MG tablet Take 15 mg by mouth at bedtime.   Yes Historical Provider, MD  Multiple Vitamin (MULITIVITAMIN WITH MINERALS) TABS Take 1 tablet by mouth daily before breakfast.    Yes Historical Provider, MD  ondansetron (ZOFRAN) 4 MG tablet Take 1 tablet (4 mg total) by mouth every 6 (six) hours as needed for nausea. 07/11/15  Yes Albertine Patricia, MD  OVER THE COUNTER MEDICATION Apply 1 application topically at bedtime. Magnlife foot cream- pain   Yes Historical Provider, MD  oxyCODONE (OXY IR/ROXICODONE) 5 MG immediate release tablet Take 5 mg by mouth every 6 (six) hours as needed for moderate pain or severe pain.   Yes Historical Provider, MD  polyethylene glycol (MIRALAX / GLYCOLAX) packet Take 17 g by mouth at bedtime.    Yes Historical Provider, MD  senna (SENOKOT) 8.6 MG tablet Take 1 tablet by mouth 4 (four) times daily.   Yes Historical Provider, MD  sertraline  (ZOLOFT) 25 MG tablet Take 25 mg by mouth daily. 11/21/12  Yes Historical Provider, MD  tamsulosin (FLOMAX) 0.4 MG CAPS capsule Take 0.4 mg by mouth.   Yes Historical Provider, MD  vitamin B-12 (CYANOCOBALAMIN) 1000 MCG tablet Take 1,000 mcg by mouth daily before breakfast.    Yes Historical Provider, MD  vitamin C (ASCORBIC ACID) 500 MG tablet Take 500 mg by  mouth daily before breakfast.    Yes Historical Provider, MD  aspirin 325 MG EC tablet Take 1 tablet (325 mg total) by mouth 2 (two) times daily at 8 am and 10 pm. Please take total of 4 weeks then stop Patient not taking: Reported on 08/31/2015 07/11/15   Silver Huguenin Elgergawy, MD  docusate sodium (COLACE) 100 MG capsule Take 2 capsules (200 mg total) by mouth 2 (two) times daily. Patient not taking: Reported on 08/31/2015 07/11/15   Silver Huguenin Elgergawy, MD  mupirocin ointment (BACTROBAN) 2 % Place 1 application into the nose 2 (two) times daily. For 5 days Patient not taking: Reported on 08/31/2015 07/11/15   Silver Huguenin Elgergawy, MD  oxyCODONE-acetaminophen (PERCOCET/ROXICET) 5-325 MG tablet Take 1 tablet by mouth every 6 (six) hours as needed for moderate pain. Patient not taking: Reported on 08/31/2015 07/09/15   Loni Dolly, PA-C   Allergies  Allergen Reactions  . Amoxicillin Swelling    Has patient had a PCN reaction causing immediate rash, facial/tongue/throat swelling, SOB or lightheadedness with hypotension: Unknown Has patient had a PCN reaction causing severe rash involving mucus membranes or skin necrosis: Unknown Has patient had a PCN reaction that required hospitalization: Unknown Has patient had a PCN reaction occurring within the last 10 years: Unknown If all of the above answers are "NO", then may proceed with Cephalosporin use.   Marland Kitchen Apresoline Esidrix [Hydralazine-Hctz] Itching and Swelling  . Hctz [Hydrochlorothiazide] Other (See Comments)    hyponatremia  . Tetracyclines & Related   . Levaquin [Levofloxacin In D5w]     Unknown  reaction  . Serzone [Nefazodone] Other (See Comments)    Felt bad  . Biaxin [Clarithromycin] Rash  . Other Itching    CT CONTRAST DYE    Review of Systems + for ongoing decline since hip fracture in January 2017 as per wife.   Physical Exam Pale, weak-appearing Caucasian gentleman resting in bed Shallow breathing S1-S2 Abdomen soft Picture of sacral decubitus reviewed from medical chart No edema Extremities warm to touch no coolness no mottling  Vital Signs: BP 188/43 mmHg  Pulse 62  Temp(Src) 98.6 F (37 C) (Axillary)  Resp 16  Ht 5' 9.5" (1.765 m)  Wt 78.654 kg (173 lb 6.4 oz)  BMI 25.25 kg/m2  SpO2 100%  SpO2: SpO2: 100 % O2 Device:SpO2: 100 % O2 Flow Rate: .O2 Flow Rate (L/min): 2 L/min  IO: Intake/output summary:  Intake/Output Summary (Last 24 hours) at 08/31/15 1226 Last data filed at 08/31/15 0700  Gross per 24 hour  Intake    300 ml  Output    275 ml  Net     25 ml    LBM: Last BM Date: 08/31/15 Baseline Weight: Weight: 83.915 kg (185 lb) Most recent weight: Weight: 78.654 kg (173 lb 6.4 oz)      Palliative Assessment/Data:  Flowsheet Rows        Most Recent Value   Intake Tab    Referral Department  Hospitalist   Unit at Time of Referral  Med/Surg Unit   Palliative Care Primary Diagnosis  Neurology   Palliative Care Type  New Palliative care   Reason for referral  Clarify Goals of Care, End of Life Care Assistance   Date first seen by Palliative Care  08/31/15   Clinical Assessment    Palliative Performance Scale Score  20%   Pain Max last 24 hours  5   Pain Min Last 24 hours  4   Dyspnea  Max Last 24 Hours  4   Dyspnea Min Last 24 hours  3   Psychosocial & Spiritual Assessment    Palliative Care Outcomes    Patient/Family meeting held?  Yes   Who was at the meeting?  wife    Palliative Care Outcomes  Clarified goals of care, Counseled regarding hospice   Palliative Care follow-up planned  No      Additional Data Reviewed:  CBC:      Component Value Date/Time   WBC 10.7* 08/31/2015 0456   HGB 8.0* 08/31/2015 0456   HCT 26.5* 08/31/2015 0456   PLT 252 08/31/2015 0456   MCV 86.0 08/31/2015 0456   NEUTROABS 11.1* 08/30/2015 2239   LYMPHSABS 1.0 08/30/2015 2239   MONOABS 0.5 08/30/2015 2239   EOSABS 0.0 08/30/2015 2239   BASOSABS 0.0 08/30/2015 2239   Comprehensive Metabolic Panel:    Component Value Date/Time   NA 140 08/31/2015 0456   K 4.3 08/31/2015 0456   CL 105 08/31/2015 0456   CO2 21* 08/31/2015 0456   BUN 91* 08/31/2015 0456   CREATININE 2.62* 08/31/2015 0456   GLUCOSE 139* 08/31/2015 0456   CALCIUM 8.1* 08/31/2015 0456   AST 28 08/31/2015 0456   ALT 22 08/31/2015 0456   ALKPHOS 52 08/31/2015 0456   BILITOT 0.7 08/31/2015 0456   PROT 6.0* 08/31/2015 0456   ALBUMIN 2.5* 08/31/2015 0456     Time In: 11 Time Out: 12 Time Total: 60 Greater than 50%  of this time was spent counseling and coordinating care related to the above assessment and plan.  Signed by: Loistine Chance, MD [place], MD  08/31/2015, 12:26 PM  Please contact Palliative Medicine Team phone at [phone] for questions and concerns.

## 2015-08-31 NOTE — Progress Notes (Signed)
Pharmacy Antibiotic Note  [name] is a 82 y.o. male admitted on 08/30/2015 with pneumonia.  Patient with c/o N/V/D and weakness.  Recently discharged from rehab facility.  Patient sustained hip fracture in Jan.  Also noted to have a draining bed sore.  Pharmacy has been consulted for Vancomycin dosing.  Plan: Vancomycin 1gm IV q48h Cefepime 2gm IV x 1 per MD F/U continuation of Cefepime upon admission F/U cultures/sensitivities Watch renal function.  Check Vancomycin level when appropriate.  Height: 5\' 9"  (175.3 cm) Weight: 185 lb (83.915 kg) IBW/kg (Calculated) : 70.7  Temp (24hrs), Avg:98.5 F (36.9 C), Min:98.5 F (36.9 C), Max:98.5 F (36.9 C)   Recent Labs Lab 08/30/15 2213 08/30/15 2239  WBC  --  12.6*  CREATININE  --  2.83*  LATICACIDVEN 1.20  --     Estimated Creatinine Clearance: 16.7 mL/min (by C-G formula based on Cr of 2.83).    Allergies  Allergen Reactions  . Amoxicillin Swelling    Has patient had a PCN reaction causing immediate rash, facial/tongue/throat swelling, SOB or lightheadedness with hypotension: Unknown Has patient had a PCN reaction causing severe rash involving mucus membranes or skin necrosis: Unknown Has patient had a PCN reaction that required hospitalization: Unknown Has patient had a PCN reaction occurring within the last 10 years: Unknown If all of the above answers are "NO", then may proceed with Cephalosporin use.   Marland Kitchen Apresoline Esidrix [Hydralazine-Hctz] Itching and Swelling  . Hctz [Hydrochlorothiazide] Other (See Comments)    hyponatremia  . Tetracyclines & Related   . Levaquin [Levofloxacin In D5w]     Unknown reaction  . Serzone [Nefazodone] Other (See Comments)    Felt bad  . Biaxin [Clarithromycin] Rash  . Other Itching    CT CONTRAST DYE    Antimicrobials this admission: 3/11 Vanc >>   3/11 Cefepime >>    Dose adjustments this admission:   Goal: Vancomycin trough = 15-20 mcg/ml  Microbiology results: 3/11  BCx: sent 3/11 UCx: sent  3/11 CDiff:  sent   Thank you for allowing pharmacy to be a part of this patient's care.  Everette Rank, PharmD 08/31/2015 12:15 AM

## 2015-09-01 LAB — HEMOGLOBIN A1C
HEMOGLOBIN A1C: 6.2 % — AB (ref 4.8–5.6)
Mean Plasma Glucose: 131 mg/dL

## 2015-09-01 LAB — GLUCOSE, CAPILLARY: GLUCOSE-CAPILLARY: 104 mg/dL — AB (ref 65–99)

## 2015-09-01 LAB — C DIFFICILE QUICK SCREEN W PCR REFLEX
C DIFFICILE (CDIFF) INTERP: POSITIVE
C DIFFICILE (CDIFF) TOXIN: POSITIVE — AB
C DIFFICLE (CDIFF) ANTIGEN: POSITIVE — AB

## 2015-09-01 NOTE — Progress Notes (Signed)
Daily Progress Note   Patient Name: [name]       Date: 09/01/2015 DOB: February 03, 1921  Age: 82 y.o. MRN#: [id number] Attending Physician: Modena Jansky, MD Primary Care Physician: Horatio Pel, MD Admit Date: 08/30/2015  Reason for Consultation/Follow-up: Establishing goals of care  Life limiting illness: elderly gentleman with sacral decub, renal failure, C Diff, weakness, DM  Subjective:  awake not alert, resting comfortably, does not appear to be in distress.   Interval Events:  wife not currently at bedside Patient from [place], [place], [facility]. SW consult pending for transfer to inpatient hospice Comfort measures only Continue current treatment to maximize comfort measures   Length of Stay: 1 day  Current Medications: Scheduled Meds:  . antiseptic oral rinse  7 mL Mouth Rinse q12n4p  . chlorhexidine  15 mL Mouth Rinse BID  . Chlorhexidine Gluconate Cloth  6 each Topical Q0600  . mupirocin ointment  1 application Nasal BID  . vancomycin  125 mg Oral QID    Continuous Infusions:    PRN Meds: acetaminophen **OR** acetaminophen, HYDROmorphone (DILAUDID) injection, LORazepam, [DISCONTINUED] ondansetron **OR** ondansetron (ZOFRAN) IV  Physical Exam: Physical Exam             Weak frail elderly gentleman Some what more awake than yesterday Does not meaningfully verbalize to me Sacral decub S1 S2 Clear anteriorly No edema  Vital Signs: BP 146/51 mmHg  Pulse 80  Temp(Src) 99.2 F (37.3 C) (Axillary)  Resp 16  Ht 5' 9.5" (1.765 m)  Wt 78.654 kg (173 lb 6.4 oz)  BMI 25.25 kg/m2  SpO2 96% SpO2: SpO2: 96 % O2 Device: O2 Device: Nasal Cannula O2 Flow Rate: O2 Flow Rate (L/min): 2 L/min  Intake/output summary:  Intake/Output Summary  (Last 24 hours) at 09/01/15 0957 Last data filed at 09/01/15 0500  Gross per 24 hour  Intake    300 ml  Output    600 ml  Net   -300 ml   LBM: Last BM Date: 09/01/15 (flexiseal) Baseline Weight: Weight: 83.915 kg (185 lb) Most recent weight: Weight: 78.654 kg (173 lb 6.4 oz)       Palliative Assessment/Data: Flowsheet Rows        Most Recent Value   Intake Tab    Referral Department  Hospitalist  Unit at Time of Referral  Med/Surg Unit   Palliative Care Primary Diagnosis  Sepsis/Infectious Disease   Palliative Care Type  Return patient Palliative Care   Reason for referral  Counsel Regarding Hospice   Date first seen by Palliative Care  08/31/15   Clinical Assessment    Palliative Performance Scale Score  20%   Pain Max last 24 hours  5   Pain Min Last 24 hours  4   Dyspnea Max Last 24 Hours  4   Dyspnea Min Last 24 hours  3   Psychosocial & Spiritual Assessment    Palliative Care Outcomes    Patient/Family meeting held?  Yes   Who was at the meeting?  wife    Palliative Care Outcomes  Clarified goals of care, Counseled regarding hospice   Palliative Care follow-up planned  No      Additional Data Reviewed: CBC    Component Value Date/Time   WBC 10.7* 08/31/2015 0456   RBC 3.08* 08/31/2015 0456   HGB 8.0* 08/31/2015 0456   HCT 26.5* 08/31/2015 0456   PLT 252 08/31/2015 0456   MCV 86.0 08/31/2015 0456   MCH 26.0 08/31/2015 0456   MCHC 30.2 08/31/2015 0456   RDW 16.7* 08/31/2015 0456   LYMPHSABS 1.0 08/30/2015 2239   MONOABS 0.5 08/30/2015 2239   EOSABS 0.0 08/30/2015 2239   BASOSABS 0.0 08/30/2015 2239    CMP     Component Value Date/Time   NA 140 08/31/2015 0456   K 4.3 08/31/2015 0456   CL 105 08/31/2015 0456   CO2 21* 08/31/2015 0456   GLUCOSE 139* 08/31/2015 0456   BUN 91* 08/31/2015 0456   CREATININE 2.62* 08/31/2015 0456   CALCIUM 8.1* 08/31/2015 0456   PROT 6.0* 08/31/2015 0456   ALBUMIN 2.5* 08/31/2015 0456   AST 28 08/31/2015 0456   ALT  22 08/31/2015 0456   ALKPHOS 52 08/31/2015 0456   BILITOT 0.7 08/31/2015 0456   GFRNONAA 20* 08/31/2015 0456   GFRAA 23* 08/31/2015 0456       Problem List:  Patient Active Problem List   Diagnosis Date Noted  . Hypotension 08/31/2015  . Sacral osteomyelitis ([place]) 08/31/2015  . Enteritis due to Clostridium difficile 08/31/2015  . Arterial hypotension   . Pyuria   . Encounter for palliative care   . Goals of care, counseling/discussion   . Lung nodule 08/30/2015  . Decubitus ulcer of sacral region, stage 4 ([place]) 08/30/2015  . UTI (lower urinary tract infection) 08/30/2015  . Dehydration 08/30/2015  . Anemia of chronic disease 08/30/2015  . Acute on chronic renal failure ([place]) 08/30/2015  . DM type 2 causing CKD stage 5 ([place]) 08/30/2015  . Pressure ulcer 07/11/2015  . Hip fracture ([place]) 07/07/2015  . Aftercare following surgery of the circulatory system, [place] 12/12/2012  . Memory deficit 11/29/2012  . Carpal tunnel syndrome 07/18/2012  . Spinal stenosis, lumbar region, without neurogenic claudication 07/18/2012  . Other vitamin B12 deficiency anemia 07/18/2012  . Abnormality of gait 07/18/2012  . Diabetic polyneuropathy ([place]) 07/18/2012  . CHF, acute on chronic ([place]) 05/26/2012  . Leukocytosis 05/25/2012  . PVD (peripheral vascular disease) ([place]) 05/24/2012  . CAD (coronary artery disease) 05/24/2012  . CKD (chronic kidney disease) stage 3, GFR 30-59 ml/min 05/24/2012  . Diabetes mellitus type II, controlled ([place]) 05/24/2012  . HTN (hypertension) 05/24/2012  . Bursitis of right hip 08/13/2011     Palliative Care Assessment & Plan    1.Code Status:  DNR  Code Status Orders        Start     Ordered   08/31/15 0142  Do not attempt resuscitation (DNR)   Continuous    Question Answer Comment  In the event of cardiac or respiratory ARREST Do not call a "code blue"   In the event of cardiac or respiratory ARREST Do not perform Intubation, CPR, defibrillation or  ACLS   In the event of cardiac or respiratory ARREST Use medication by any route, position, wound care, and other measures to relive pain and suffering. May use oxygen, suction and manual treatment of airway obstruction as needed for comfort.      08/31/15 0141    Code Status History    Date Active Date Inactive Code Status Order ID Comments User Context   07/08/2015 12:42 PM 07/11/2015  9:48 PM Full Code [id number]  Loni Dolly, PA-C Inpatient   07/07/2015 10:04 AM 07/08/2015 12:42 PM Full Code [id number]  Reyne Dumas, MD ED   05/24/2012  3:52 PM 05/29/2012  7:42 PM Full Code [id number]  Monika Salk, MD ED   08/13/2011 11:28 AM 08/15/2011  1:57 PM Full Code [id number]  Madilyn Fireman, RN Inpatient    Advance Directive Documentation        Most Recent Value   Type of Advance Directive  Healthcare Power of [place] [DNR]   Pre-existing out of facility DNR order (yellow form or pink MOST form)     "MOST" Form in Place?         2. Goals of Care/Additional Recommendations:     Limitations on Scope of Treatment: Full Comfort Care  Desire for further Chaplaincy support:no  Psycho-social Needs: Caregiving  Support/Resources  3. Symptom Management:      1. As above   4. Palliative Prophylaxis:   Delirium Protocol  5. Prognosis: < 2 weeks  6. Discharge Planning:  Hospice facility   Care plan was discussed with  Patient's RN  Thank you for allowing the Palliative Medicine Team to assist in the care of this patient.   Time In: 8:30 Time Out: 8:55 Total Time 25 Prolonged Time Billed  no        [phone]  Loistine Chance, MD  09/01/2015, 9:57 AM  Please contact Palliative Medicine Team phone at [phone] for questions and concerns.

## 2015-09-01 NOTE — Progress Notes (Signed)
Critical lab value, blood culture positive for  aerobic gram positive cocci clusters.

## 2015-09-01 NOTE — Consult Note (Signed)
WOC wound consult note Reason for Consult:Stage 4 pressure injury to sacrum.  Rectal tube in place Stage 2 pressure injury to left heel Stage 2 pressure injury to left posterior thigh Device related pressure injury to meatus from indwelling foley catheter.  Will keep moisturized with barrier cream.  Wound type:pressure injury Pressure Ulcer POA: Yes Measurement:Sacrum 7 cm x 5.4 cm x 4.2 cm with palpable bone present.  Wound [id number] pink nongranulating tissue.  Muscle visible and bone palpable Drainage (amount, consistency, odor) Moderate serosanguinous with foul odor Periwound:Erythema Dressing procedure/placement/frequency:Cleanse wound to sacrum with NS and pat gently dry.  Fill wound bed with calcium alginate [name] [id number]).  Cover with 4x4 gauze and ABD pad.  Change daily. Barrier cream to meatus daily.  Silicone border foam to left heel and left posterior thigh wound. Change every 3 days and PRN soilage.   Will not follow at this time.  Please re-consult if needed.  [name] RN BSN [place] Pager [phone]

## 2015-09-01 NOTE — Clinical Social Work Note (Signed)
Clinical Social Work Assessment  Patient Details  Name: [name] MRN: [id number] Date of Birth: February 21, 1921  Date of referral:  09/01/15               Reason for consult:  Discharge Planning                Permission sought to share information with:  Family Supports Permission granted to share information::  Yes, Verbal Permission Granted  Name::     Marko Skalski  Agency::     Relationship::  spouse  Contact Information:  [phone]  Housing/Transportation Living arrangements for the past 2 months:  Single Family Home Source of Information:  Spouse Patient Interpreter Needed:  None Criminal Activity/Legal Involvement Pertinent to Current Situation/Hospitalization:  No - Comment as needed Significant Relationships:  Spouse Lives with:  Spouse Do you feel safe going back to the place where you live?  No Need for family participation in patient care:  Yes (Comment)  Care giving concerns:  Pt admitted from home. PMT GOC held and recommendation for residential hospice placement.    Social Worker assessment / plan:    CSW received referral for residential hospice.  CSW met with pt and pt wife at bedside. Pt pleasantly confused. CSW discussed recommendation for residential hospice. CSW offered choice. Pt wife expressed that first choice is [facility]. Pt wife will consider second options in case [facility] is full and explained to pt wife that if [facility] is full then second option will have to be explored. Pt wife expressed understanding, but stated that she would really prefer [facility].  CSW made referral to [facility], Erling Conte. [facility] liaison will process referral and update CSW.   CSW to follow up with pt wife re: response from [facility].   Employment status:  Retired Nurse, adult PT Recommendations:  Not assessed at this time Information / Referral to community resources:  Other (Comment Required)  (residential hospice)  Patient/Family's Response to care:  Pt alert and oriented to person only. Pt wife hopeful for [facility]. Referral made.  Patient/Family's Understanding of and Emotional Response to Diagnosis, Current Treatment, and Prognosis:  Pt pleasantly confused and pt wife expressed understanding surrounding diagnosis and poor prognosis.   Emotional Assessment Appearance:  Appears stated age Attitude/Demeanor/Rapport:  Unable to Assess (pleasantly confused. oriented to person only.) Affect (typically observed):  Appropriate (pleasantly confused) Orientation:  Oriented to Self Alcohol / Substance use:  Not Applicable Psych involvement (Current and /or in the community):  No (Comment)  Discharge Needs  Concerns to be addressed:  Discharge Planning Concerns Readmission within the last 30 days:  No Current discharge risk:  Terminally ill Barriers to Discharge:  Other (referral made to residential hospice)   Ladell Pier, LCSW 09/01/2015, 10:12 AM  [phone]

## 2015-09-01 NOTE — Progress Notes (Signed)
PROGRESS NOTE    [name]  [id number]  DOB: March 20, 1921  DOA: 08/30/2015 PCP: Horatio Pel, MD Outpatient Specialists:   Hospital course: 82 year old male with history of stage IV chronic kidney disease, DM 2 with peripheral neuropathy, HTN, CAD, PAD, GERD, HLD, possible dementia, chronic indwelling Foley catheter, large sacral decubitus ulcer, status post fall and left hip fracture surgery on 07/08/15, discharged to SNF for rehabilitation and returned home Tues of this week, presented to ED with complaints of progressive fatigue, nausea, vomiting and diarrhea. In the ED, patient hypotensive 70/40, acute on chronic kidney disease, anemia, UA suggestive of possible UTI, chest x-ray showed mass worrisome for malignancy. Admitted for further management. C. difficile +. Extensively discussed with spouse who indicates fairly rapid decline since fall mid January and has not ambulated since. Palliative care was consulted and patient transitioned to complete comfort care. Await residential hospice.  Assessment & Plan:   Principal Problem:   Enteritis due to Clostridium difficile - Continue oral vancomycin to help reduce diarrhea and hence symptom management measure.  Active Problems:   Diabetes mellitus type II, controlled ([place]) - Patient has been transitioned to full comfort care on 3/12 and hence all medications nonessential to comfort were discontinued.    Lung nodule - no work up    Decubitus ulcer of sacral region, stage 4 ([place]) - comfort care    UTI (lower urinary tract infection) - DC Abx for this indication    Dehydration - d/t GI losses. Oral feeds for comfort    Anemia of chronic disease - No follow up or transfusion    Acute on chronic renal failure (HCC) - better    Hypotension - resolved.  1/2 blood cultures positive for gram-positive cocci - No plans to treat.     DVT prophylaxis: None d/t full comfort care. Code Status: DO NOT  RESUSCITATE Family Communication: Discussed with spouse at bedside. Disposition Plan: DC to residential hospice when bed available.   Consultants:  Palliative care medicine  Procedures:  Chronic indwelling Foley catheter  Rectal tube  Antimicrobials:  Oral vancomycin   Subjective: Seen this morning. Denied complaints. As per RN, no acute issues.  Objective: Filed Vitals:   08/31/15 0320 08/31/15 0451 09/01/15 0456 09/01/15 1407  BP: 100/54 188/43 146/51 142/46  Pulse: 68 62 80 45  Temp: 98.7 F (37.1 C) 98.6 F (37 C) 99.2 F (37.3 C) 97.3 F (36.3 C)  TempSrc: Axillary Axillary Axillary Axillary  Resp: 16 16 16 15   Height: 5' 9.5" (1.765 m)     Weight: 78.654 kg (173 lb 6.4 oz)     SpO2: 100% 100% 96% 10%    Intake/Output Summary (Last 24 hours) at 09/01/15 1447 Last data filed at 09/01/15 1409  Gross per 24 hour  Intake    120 ml  Output    950 ml  Net   -830 ml   Filed Weights   08/30/15 2327 08/31/15 0320  Weight: 83.915 kg (185 lb) 78.654 kg (173 lb 6.4 oz)    Exam:  General exam: Pleasant elderly male, chronically ill-looking, lying comfortably supine in bed. Respiratory system: Clear. No increased work of breathing. Cardiovascular system: S1 & S2 heard, RRR. No JVD, murmurs, gallops, clicks or pedal edema. Gastrointestinal system: Abdomen is nondistended, soft and nontender. Normal bowel sounds heard. Indwelling Foley catheter. Central nervous system: Alert and oriented only to self. No focal neurological deficits. Extremities: Symmetric 5 x 5 power.   Data Reviewed: Basic Metabolic Panel:  Recent Labs Lab 08/30/15 2239 08/31/15 0456  NA 137 140  K 4.6 4.3  CL 100* 105  CO2 21* 21*  GLUCOSE 136* 139*  BUN 82* 91*  CREATININE 2.83* 2.62*  CALCIUM 8.4* 8.1*  MG  --  2.1  PHOS  --  4.4   Liver Function Tests:  Recent Labs Lab 08/30/15 2239 08/31/15 0456  AST 29 28  ALT 24 22  ALKPHOS 60 52  BILITOT 1.0 0.7  PROT 6.4* 6.0*   ALBUMIN 2.6* 2.5*   No results for input(s): LIPASE, AMYLASE in the last 168 hours. No results for input(s): AMMONIA in the last 168 hours. CBC:  Recent Labs Lab 08/30/15 2239 08/31/15 0456  WBC 12.6* 10.7*  NEUTROABS 11.1*  --   HGB 9.5* 8.0*  HCT 30.3* 26.5*  MCV 81.0 86.0  PLT 260 252   Cardiac Enzymes: No results for input(s): CKTOTAL, CKMB, CKMBINDEX, TROPONINI in the last 168 hours. BNP (last 3 results) No results for input(s): PROBNP in the last 8760 hours. CBG:  Recent Labs Lab 08/31/15 0328 08/31/15 0456 08/31/15 0807 08/31/15 1135  GLUCAP 141* 124* 104* 107*    Recent Results (from the past 240 hour(s))  C difficile quick scan w PCR reflex     Status: Abnormal   Collection Time: 08/30/15 10:39 PM  Result Value Ref Range Status   C Diff antigen POSITIVE (A) NEGATIVE Final   C Diff toxin POSITIVE (A) NEGATIVE Final    Comment: CRITICAL RESULT CALLED TO, READ BACK BY AND VERIFIED WITH: A.SHELL,RN AT 0034 ON 08/31/15 BY W.SHEA    C Diff interpretation Positive for toxigenic C. difficile  Final    Comment: CRITICAL RESULT CALLED TO, READ BACK BY AND VERIFIED WITH: A.SHELL,RN AT 0034 ON 08/31/15 BY W.SHEA   Culture, blood (routine x 2)     Status: None (Preliminary result)   Collection Time: 08/30/15 10:41 PM  Result Value Ref Range Status   Specimen Description BLOOD LEFT HAND  Final   Special Requests BOTTLES DRAWN AEROBIC AND ANAEROBIC [place]  Final   Culture   Final    NO GROWTH 1 DAY Performed at [facility]    Report Status PENDING  Incomplete  Culture, blood (routine x 2)     Status: None (Preliminary result)   Collection Time: 08/30/15 10:42 PM  Result Value Ref Range Status   Specimen Description BLOOD BLOOD LEFT WRIST  Final   Special Requests BOTTLES DRAWN AEROBIC AND ANAEROBIC 6CC  Final   Culture  Setup Time   Final    GRAM POSITIVE COCCI IN CLUSTERS AEROBIC BOTTLE ONLY CRITICAL RESULT CALLED TO, READ BACK BY AND VERIFIED WITH: Farrell Ours RN [phone] [id number] GREEN R    Culture   Final    CULTURE REINCUBATED FOR BETTER GROWTH Performed at [facility]    Report Status PENDING  Incomplete  Gastrointestinal Panel by PCR , Stool     Status: None   Collection Time: 08/31/15  4:26 AM  Result Value Ref Range Status   Campylobacter species NOT DETECTED NOT DETECTED Final   Plesimonas shigelloides NOT DETECTED NOT DETECTED Final   Salmonella species NOT DETECTED NOT DETECTED Final   Yersinia enterocolitica NOT DETECTED NOT DETECTED Final   Vibrio species NOT DETECTED NOT DETECTED Final   Vibrio cholerae NOT DETECTED NOT DETECTED Final   Enteroaggregative E coli (EAEC) NOT DETECTED NOT DETECTED Final   Enteropathogenic E coli (EPEC) NOT DETECTED NOT DETECTED Final  Enterotoxigenic E coli (ETEC) NOT DETECTED NOT DETECTED Final   Shiga like toxin producing E coli (STEC) NOT DETECTED NOT DETECTED Final   E. coli O157 NOT DETECTED NOT DETECTED Final   Shigella/Enteroinvasive E coli (EIEC) NOT DETECTED NOT DETECTED Final   Cryptosporidium NOT DETECTED NOT DETECTED Final   Cyclospora cayetanensis NOT DETECTED NOT DETECTED Final   Entamoeba histolytica NOT DETECTED NOT DETECTED Final   Giardia lamblia NOT DETECTED NOT DETECTED Final   Adenovirus F40/41 NOT DETECTED NOT DETECTED Final   Astrovirus NOT DETECTED NOT DETECTED Final   Norovirus GI/GII NOT DETECTED NOT DETECTED Final   Rotavirus A NOT DETECTED NOT DETECTED Final   Sapovirus (I, II, IV, and V) NOT DETECTED NOT DETECTED Final  MRSA PCR Screening     Status: Abnormal   Collection Time: 08/31/15  6:10 AM  Result Value Ref Range Status   MRSA by PCR POSITIVE (A) NEGATIVE Final    Comment:        The GeneXpert MRSA Assay (FDA approved for NASAL specimens only), is one component of a comprehensive MRSA colonization surveillance program. It is not intended to diagnose MRSA infection nor to guide or monitor treatment for MRSA infections. RESULT CALLED TO, READ  BACK BY AND VERIFIED WITH: K OSBOURNE AT [id number] ON 03.12.2017 BY NBROOKS          Studies: Dg Chest Port 1 View  08/30/2015  CLINICAL DATA:  Cough and weakness EXAM: PORTABLE CHEST 1 VIEW COMPARISON:  07/07/2015 FINDINGS: No cardiomegaly.  Negative aortic and hilar contours. Chronic interstitial coarsening. There is no edema, consolidation, effusion, or pneumothorax. Re- demonstrated now 37 mm nodule over the right lung. Symmetric biapical pleural scarring/thickening. IMPRESSION: 1. No acute finding. 2. 37 mm right lung nodule primarily concerning for carcinoma. Recommend follow-up chest CT (contrast not essential in this patient with stage 4 chronic kidney disease). 3. Chronic lung disease. Electronically Signed   By: Monte Fantasia M.D.   On: 08/30/2015 22:51        Scheduled Meds: . antiseptic oral rinse  7 mL Mouth Rinse q12n4p  . chlorhexidine  15 mL Mouth Rinse BID  . Chlorhexidine Gluconate Cloth  6 each Topical Q0600  . mupirocin ointment  1 application Nasal BID  . vancomycin  125 mg Oral QID   Continuous Infusions:    Principal Problem:   Enteritis due to Clostridium difficile Active Problems:   Diabetes mellitus type II, controlled ([place])   Lung nodule   Decubitus ulcer of sacral region, stage 4 (HCC)   UTI (lower urinary tract infection)   Dehydration   Anemia of chronic disease   Acute on chronic renal failure (HCC)   DM type 2 causing CKD stage 5 (HCC)   Hypotension   Sacral osteomyelitis ([place])   Encounter for palliative care   Goals of care, counseling/discussion    Time spent: 15 minutes.    Vernell Leep, MD, FACP, FHM. [facility] Pager [phone]  If 7PM-7AM, please contact night-coverage [email or web address] Password [email or web address] 09/01/2015, 2:47 PM    LOS: 1 day

## 2015-09-01 NOTE — Consult Note (Signed)
[facility] [facility] Received request from [place] for family interest in [facility]. Chart reviewed and met briefly with spouse at bedside. She is aware [facility] is not able to offer a room today and that we will follow up in the morning. CSW also aware.   Thank you.  Erling Conte, [place]

## 2015-09-01 NOTE — Progress Notes (Signed)
Nutrition Brief Note  Patient identified on the [place] Report.  Chart reviewed.  Patient with stage IV chronic kidney disease, large sacral decubitus ulcer, s/p fall and left hip fracture surgery on 07/08/15, and C. difficile +.  Pt with fairly rapid decline since fall.   Patient now transitioning to comfort care.  No nutrition interventions warranted at this time.  Please consult as needed.  Veronda Prude, Dietetic Intern Pager: [phone]

## 2015-09-01 NOTE — Progress Notes (Addendum)
SLP Cancellation Note  Patient Details Name: [name] MRN: [id number] DOB: [birth year]   Cancelled treatment:       Reason Eval/Treat Not Completed: SLP screened, no needs identified, will sign off. Pt has transitioned to full comfort measures. Discussed any needs with wife who reports she does not have any concerns as pt eats and drinks when he desires to do so. Evaluation deferred.    Ardith Test, Katherene Ponto 09/01/2015, 9:52 AM

## 2015-09-02 LAB — URINE CULTURE

## 2015-09-02 MED ORDER — HYDROMORPHONE HCL 1 MG/ML IJ SOLN: 0.5000 mg | INTRAMUSCULAR | Status: AC | PRN

## 2015-09-02 MED ORDER — LORAZEPAM 2 MG/ML IJ SOLN: 1.0000 mg | INTRAMUSCULAR | Status: AC | PRN

## 2015-09-02 MED ORDER — VANCOMYCIN 50 MG/ML ORAL SOLUTION: 125.0000 mg | Freq: Four times a day (QID) | ORAL | Status: AC

## 2015-09-02 NOTE — Progress Notes (Signed)
Patient is set to discharge to [facility] today. Patient & wife, [name] aware. Discharge packet given to RN, Maudie Mercury. [facility] called for transport.     Raynaldo Opitz, [facility] Clinical Social Worker cell #: [phone]

## 2015-09-02 NOTE — Discharge Summary (Signed)
Physician Discharge Summary  [name]  [id number]  DOB: 1920/10/31  DOA: 08/30/2015  PCP: Joshua Pel, MD  Admit date: 08/30/2015 Discharge date: 09/02/2015  Time spent: Greater than 30 minutes  Recommendations for Outpatient Follow-up:  1. Patient being discharged to residential hospice at [facility].  Discharge Diagnoses:  Principal Problem:   Enteritis due to Clostridium difficile Active Problems:   Diabetes mellitus type II, controlled ([place])   Lung nodule   Decubitus ulcer of sacral region, stage 4 (HCC)   UTI (lower urinary tract infection)   Dehydration   Anemia of chronic disease   Acute on chronic renal failure (HCC)   DM type 2 causing CKD stage 5 ([place])   Hypotension   Sacral osteomyelitis ([place])   Encounter for palliative care   Goals of care, counseling/discussion   Discharge Condition: Improved & Stable  Diet recommendation: Regular diet.  Filed Weights   08/30/15 2327 08/31/15 0320  Weight: 83.915 kg (185 lb) 78.654 kg (173 lb 6.4 oz)    History of present illness:  82 year old male with history of stage IV chronic kidney disease, DM 2 with peripheral neuropathy, HTN, CAD, PAD, GERD, HLD, possible dementia, chronic indwelling Foley catheter, large sacral decubitus ulcer, status post fall and left hip fracture surgery on 07/08/15, discharged to SNF for rehabilitation and returned home 4 days prior to this admission, presented to ED with complaints of progressive fatigue, nausea, vomiting and diarrhea. In the ED, patient hypotensive 70/40, acute on chronic kidney disease, anemia, UA suggestive of possible UTI, chest x-ray showed mass worrisome for malignancy. Admitted for further management. C. difficile +. Extensively discussed with spouse who indicates fairly rapid decline since fall mid January and has not ambulated since. Palliative care was consulted and patient transitioned to complete comfort care.   Hospital Course:   Principal  Problem:  Enteritis due to Clostridium difficile - Continue oral vancomycin to help reduce diarrhea and hence symptom management measure. Diarrhea improved. Complete vancomycin course on 09/13/15.  Active Problems:  Diabetes mellitus type II, controlled ([place]) - Patient has been transitioned to full comfort care on 3/12 and hence all medications nonessential to comfort were discontinued.   Lung nodule - no work up   Decubitus ulcer of sacral region, stage 4 ([place]) - comfort care   UTI (lower urinary tract infection) - DC Abx for this indication   Dehydration - d/t GI losses. Oral feeds for comfort   Anemia of chronic disease - No follow up or transfusion   Acute on chronic renal failure (HCC) - better   Hypotension - resolved.  1/2 blood cultures positive for gram-positive cocci in clusters - No plans to treat.  DO NOT RESUSCITATE   Family Communication: Discussed with spouse at bedside.    Consultants:  Palliative care medicine  Procedures:  Chronic indwelling Foley catheter  Rectal tube  Antimicrobials:  Oral vancomycin  Discharge Exam:  Complaints: Denies complaints. As per nursing, diarrhea has improved or resolved. As per spouse at bedside, no acute issues reported.  Filed Vitals:   09/01/15 0456 09/01/15 1407 09/01/15 2157 09/02/15 0624  BP: 146/51 142/46 147/64 159/60  Pulse: 80 45 75 90  Temp: 99.2 F (37.3 C) 97.3 F (36.3 C) 98.4 F (36.9 C) 98.8 F (37.1 C)  TempSrc: Axillary Axillary Axillary Axillary  Resp: 16 15 16 14   Height:      Weight:      SpO2: 96% 10% 96% 100%    General exam: Pleasant elderly male,  chronically ill-looking, lying comfortably supine in bed. Respiratory system: Clear. No increased work of breathing. Cardiovascular system: S1 & S2 heard, RRR. No JVD, murmurs, gallops, clicks or pedal edema. Gastrointestinal system: Abdomen is nondistended, soft and nontender. Normal bowel sounds heard. Indwelling  Foley catheter. Central nervous system: Alert and oriented only to self. No focal neurological deficits. Extremities: Symmetric 5 x 5 power. Skin: Sacral decubitus ulcer exam as per picture as below.        Discharge Instructions      Discharge Instructions    Call MD for:  difficulty breathing, headache or visual disturbances    Complete by:  As directed      Call MD for:  extreme fatigue    Complete by:  As directed      Call MD for:  persistant dizziness or light-headedness    Complete by:  As directed      Call MD for:  persistant nausea and vomiting    Complete by:  As directed      Call MD for:  redness, tenderness, or signs of infection (pain, swelling, redness, odor or green/yellow discharge around incision site)    Complete by:  As directed      Call MD for:  severe uncontrolled pain    Complete by:  As directed      Call MD for:  temperature >100.4    Complete by:  As directed      Diet general    Complete by:  As directed      Increase activity slowly    Complete by:  As directed             Medication List    STOP taking these medications        aspirin 325 MG EC tablet     aspirin EC 81 MG tablet     atorvastatin 20 MG tablet  Commonly known as:  LIPITOR     carvedilol 12.5 MG tablet  Commonly known as:  COREG     docusate sodium 100 MG capsule  Commonly known as:  COLACE     doxazosin 4 MG tablet  Commonly known as:  CARDURA     ferrous sulfate 325 (65 FE) MG tablet     finasteride 5 MG tablet  Commonly known as:  PROSCAR     furosemide 40 MG tablet  Commonly known as:  LASIX     gabapentin 100 MG capsule  Commonly known as:  NEURONTIN     losartan 100 MG tablet  Commonly known as:  COZAAR     magnesium hydroxide 400 MG/5ML suspension  Commonly known as:  MILK OF MAGNESIA     mirtazapine 15 MG tablet  Commonly known as:  REMERON     multivitamin with minerals Tabs tablet     mupirocin ointment 2 %  Commonly known as:   BACTROBAN     ondansetron 4 MG tablet  Commonly known as:  ZOFRAN     OVER THE COUNTER MEDICATION     oxyCODONE 5 MG immediate release tablet  Commonly known as:  Oxy IR/ROXICODONE     oxyCODONE-acetaminophen 5-325 MG tablet  Commonly known as:  PERCOCET/ROXICET     polyethylene glycol packet  Commonly known as:  MIRALAX / GLYCOLAX     senna 8.6 MG tablet  Commonly known as:  SENOKOT     sertraline 25 MG tablet  Commonly known as:  ZOLOFT     tamsulosin 0.4 MG  Caps capsule  Commonly known as:  FLOMAX     vitamin B-12 1000 MCG tablet  Commonly known as:  CYANOCOBALAMIN     vitamin C 500 MG tablet  Commonly known as:  ASCORBIC ACID      TAKE these medications        HYDROmorphone 1 MG/ML injection  Commonly known as:  DILAUDID  Inject 0.5 mLs (0.5 mg total) into the vein every 3 (three) hours as needed for moderate pain or severe pain (or dyspnea).     LORazepam 2 MG/ML injection  Commonly known as:  ATIVAN  Inject 0.5 mLs (1 mg total) into the vein every 4 (four) hours as needed for anxiety.     vancomycin 50 mg/mL oral solution  Commonly known as:  VANCOCIN  Take 2.5 mLs (125 mg total) by mouth 4 (four) times daily. Discontinue after 09/13/2015 doses.         Get Medicines reviewed and adjusted: Please take all your medications with you for your next visit with your Primary MD  Please request your Primary MD to go over all hospital tests and procedure/radiological results at the follow up. Please ask your Primary MD to get all Hospital records sent to his/her office.  If you experience worsening of your admission symptoms, develop shortness of breath, life threatening emergency, suicidal or homicidal thoughts you must seek medical attention immediately by calling 911 or calling your MD immediately if symptoms less severe.  You must read complete instructions/literature along with all the possible adverse reactions/side effects for all the Medicines you take  and that have been prescribed to you. Take any new Medicines after you have completely understood and accept all the possible adverse reactions/side effects.   Do not drive when taking pain medications.   Do not take more than prescribed Pain, Sleep and Anxiety Medications  Special Instructions: If you have smoked or chewed Tobacco in the last 2 yrs please stop smoking, stop any regular Alcohol and or any Recreational drug use.  Wear Seat belts while driving.  Please note  You were cared for by a hospitalist during your hospital stay. Once you are discharged, your primary care physician will handle any further medical issues. Please note that NO REFILLS for any discharge medications will be authorized once you are discharged, as it is imperative that you return to your primary care physician (or establish a relationship with a primary care physician if you do not have one) for your aftercare needs so that they can reassess your need for medications and monitor your lab values.    The results of significant diagnostics from this hospitalization (including imaging, microbiology, ancillary and laboratory) are listed below for reference.    Significant Diagnostic Studies: Dg Chest Port 1 View  08/30/2015  CLINICAL DATA:  Cough and weakness EXAM: PORTABLE CHEST 1 VIEW COMPARISON:  07/07/2015 FINDINGS: No cardiomegaly.  Negative aortic and hilar contours. Chronic interstitial coarsening. There is no edema, consolidation, effusion, or pneumothorax. Re- demonstrated now 37 mm nodule over the right lung. Symmetric biapical pleural scarring/thickening. IMPRESSION: 1. No acute finding. 2. 37 mm right lung nodule primarily concerning for carcinoma. Recommend follow-up chest CT (contrast not essential in this patient with stage 4 chronic kidney disease). 3. Chronic lung disease. Electronically Signed   By: Monte Fantasia M.D.   On: 08/30/2015 22:51    Microbiology: Recent Results (from the past 240  hour(s))  C difficile quick scan w PCR reflex     Status:  Abnormal   Collection Time: 08/30/15 10:39 PM  Result Value Ref Range Status   C Diff antigen POSITIVE (A) NEGATIVE Final   C Diff toxin POSITIVE (A) NEGATIVE Final    Comment: CRITICAL RESULT CALLED TO, READ BACK BY AND VERIFIED WITH: A.SHELL,RN AT 0034 ON 08/31/15 BY W.SHEA    C Diff interpretation Positive for toxigenic C. difficile  Final    Comment: CRITICAL RESULT CALLED TO, READ BACK BY AND VERIFIED WITH: A.SHELL,RN AT 0034 ON 08/31/15 BY W.SHEA   Urine culture     Status: None (Preliminary result)   Collection Time: 08/30/15 10:40 PM  Result Value Ref Range Status   Specimen Description URINE, CATHETERIZED  Final   Special Requests NONE  Final   Culture   Final    TOO YOUNG TO READ Performed at [facility]    Report Status PENDING  Incomplete  Culture, blood (routine x 2)     Status: None (Preliminary result)   Collection Time: 08/30/15 10:41 PM  Result Value Ref Range Status   Specimen Description BLOOD LEFT HAND  Final   Special Requests BOTTLES DRAWN AEROBIC AND ANAEROBIC [place]  Final   Culture   Final    NO GROWTH 1 DAY Performed at [facility]    Report Status PENDING  Incomplete  Culture, blood (routine x 2)     Status: None (Preliminary result)   Collection Time: 08/30/15 10:42 PM  Result Value Ref Range Status   Specimen Description BLOOD BLOOD LEFT WRIST  Final   Special Requests BOTTLES DRAWN AEROBIC AND ANAEROBIC 6CC  Final   Culture  Setup Time   Final    GRAM POSITIVE COCCI IN CLUSTERS AEROBIC BOTTLE ONLY CRITICAL RESULT CALLED TO, READ BACK BY AND VERIFIED WITH: Farrell Ours RN [phone] GREEN R    Culture   Final    CULTURE REINCUBATED FOR BETTER GROWTH Performed at [facility]    Report Status PENDING  Incomplete  Gastrointestinal Panel by PCR , Stool     Status: None   Collection Time: 08/31/15  4:26 AM  Result Value Ref Range Status   Campylobacter species NOT  DETECTED NOT DETECTED Final   Plesimonas shigelloides NOT DETECTED NOT DETECTED Final   Salmonella species NOT DETECTED NOT DETECTED Final   Yersinia enterocolitica NOT DETECTED NOT DETECTED Final   Vibrio species NOT DETECTED NOT DETECTED Final   Vibrio cholerae NOT DETECTED NOT DETECTED Final   Enteroaggregative E coli (EAEC) NOT DETECTED NOT DETECTED Final   Enteropathogenic E coli (EPEC) NOT DETECTED NOT DETECTED Final   Enterotoxigenic E coli (ETEC) NOT DETECTED NOT DETECTED Final   Shiga like toxin producing E coli (STEC) NOT DETECTED NOT DETECTED Final   E. coli O157 NOT DETECTED NOT DETECTED Final   Shigella/Enteroinvasive E coli (EIEC) NOT DETECTED NOT DETECTED Final   Cryptosporidium NOT DETECTED NOT DETECTED Final   Cyclospora cayetanensis NOT DETECTED NOT DETECTED Final   Entamoeba histolytica NOT DETECTED NOT DETECTED Final   Giardia lamblia NOT DETECTED NOT DETECTED Final   Adenovirus F40/41 NOT DETECTED NOT DETECTED Final   Astrovirus NOT DETECTED NOT DETECTED Final   Norovirus GI/GII NOT DETECTED NOT DETECTED Final   Rotavirus A NOT DETECTED NOT DETECTED Final   Sapovirus (I, II, IV, and V) NOT DETECTED NOT DETECTED Final  MRSA PCR Screening     Status: Abnormal   Collection Time: 08/31/15  6:10 AM  Result Value Ref Range Status  MRSA by PCR POSITIVE (A) NEGATIVE Final    Comment:        The GeneXpert MRSA Assay (FDA approved for NASAL specimens only), is one component of a comprehensive MRSA colonization surveillance program. It is not intended to diagnose MRSA infection nor to guide or monitor treatment for MRSA infections. RESULT CALLED TO, READ BACK BY AND VERIFIED WITH: K OSBOURNE AT 1123 ON 03.12.2017 BY NBROOKS      Labs: Basic Metabolic Panel:  Recent Labs Lab 08/30/15 2239 08/31/15 0456  NA 137 140  K 4.6 4.3  CL 100* 105  CO2 21* 21*  GLUCOSE 136* 139*  BUN 82* 91*  CREATININE 2.83* 2.62*  CALCIUM 8.4* 8.1*  MG  --  2.1  PHOS  --  4.4    Liver Function Tests:  Recent Labs Lab 08/30/15 2239 08/31/15 0456  AST 29 28  ALT 24 22  ALKPHOS 60 52  BILITOT 1.0 0.7  PROT 6.4* 6.0*  ALBUMIN 2.6* 2.5*   No results for input(s): LIPASE, AMYLASE in the last 168 hours. No results for input(s): AMMONIA in the last 168 hours. CBC:  Recent Labs Lab 08/30/15 2239 08/31/15 0456  WBC 12.6* 10.7*  NEUTROABS 11.1*  --   HGB 9.5* 8.0*  HCT 30.3* 26.5*  MCV 81.0 86.0  PLT 260 252   Cardiac Enzymes: No results for input(s): CKTOTAL, CKMB, CKMBINDEX, TROPONINI in the last 168 hours. BNP: BNP (last 3 results)  Recent Labs  07/07/15 1121  BNP 459.1*    ProBNP (last 3 results) No results for input(s): PROBNP in the last 8760 hours.  CBG:  Recent Labs Lab 08/31/15 0328 08/31/15 0456 08/31/15 0807 08/31/15 1135  GLUCAP 141* 124* 104* 107*       Signed:  Vernell Leep, MD, FACP, FHM. [facility] Pager [phone]  If 7PM-7AM, please contact night-coverage [email or web address] Password [email or web address] 09/02/2015, 10:05 AM

## 2015-09-02 NOTE — Consult Note (Signed)
[facility] room available to day for [name]. Met with spouse to complete paper work. Discharge summary has been faxed.   RN please call report to [phone].  Thank you.  [name], [place]

## 2015-09-03 LAB — CULTURE, BLOOD (ROUTINE X 2)

## 2015-09-04 DIAGNOSIS — E1142 Type 2 diabetes mellitus with diabetic polyneuropathy: Secondary | ICD-10-CM | POA: Diagnosis not present

## 2015-09-04 DIAGNOSIS — N401 Enlarged prostate with lower urinary tract symptoms: Secondary | ICD-10-CM | POA: Diagnosis not present

## 2015-09-04 DIAGNOSIS — I509 Heart failure, unspecified: Secondary | ICD-10-CM | POA: Diagnosis not present

## 2015-09-04 DIAGNOSIS — S72002D Fracture of unspecified part of neck of left femur, subsequent encounter for closed fracture with routine healing: Secondary | ICD-10-CM | POA: Diagnosis not present

## 2015-09-04 DIAGNOSIS — E1151 Type 2 diabetes mellitus with diabetic peripheral angiopathy without gangrene: Secondary | ICD-10-CM | POA: Diagnosis not present

## 2015-09-04 DIAGNOSIS — N183 Chronic kidney disease, stage 3 (moderate): Secondary | ICD-10-CM | POA: Diagnosis not present

## 2015-09-04 DIAGNOSIS — L89510 Pressure ulcer of right ankle, unstageable: Secondary | ICD-10-CM | POA: Diagnosis not present

## 2015-09-04 DIAGNOSIS — E1122 Type 2 diabetes mellitus with diabetic chronic kidney disease: Secondary | ICD-10-CM | POA: Diagnosis not present

## 2015-09-04 DIAGNOSIS — R338 Other retention of urine: Secondary | ICD-10-CM | POA: Diagnosis not present

## 2015-09-04 DIAGNOSIS — Z466 Encounter for fitting and adjustment of urinary device: Secondary | ICD-10-CM | POA: Diagnosis not present

## 2015-09-04 DIAGNOSIS — I130 Hypertensive heart and chronic kidney disease with heart failure and stage 1 through stage 4 chronic kidney disease, or unspecified chronic kidney disease: Secondary | ICD-10-CM | POA: Diagnosis not present

## 2015-09-04 DIAGNOSIS — L89892 Pressure ulcer of other site, stage 2: Secondary | ICD-10-CM | POA: Diagnosis not present

## 2015-09-04 DIAGNOSIS — L89150 Pressure ulcer of sacral region, unstageable: Secondary | ICD-10-CM | POA: Diagnosis not present

## 2015-09-05 LAB — CULTURE, BLOOD (ROUTINE X 2): CULTURE: NO GROWTH

## 2015-09-20 DEATH — deceased

## 2015-12-26 ENCOUNTER — Ambulatory Visit: Payer: Self-pay | Admitting: Neurology

## 2016-04-17 IMAGING — CR DG WRIST COMPLETE 3+V*R*
5 series · 5 of 5 positions shown · non-contrast
Comparison: None.

CLINICAL DATA: Fall with right wrist pain

EXAM:
RIGHT WRIST - COMPLETE 3+ VIEW

[x wrist pa right]
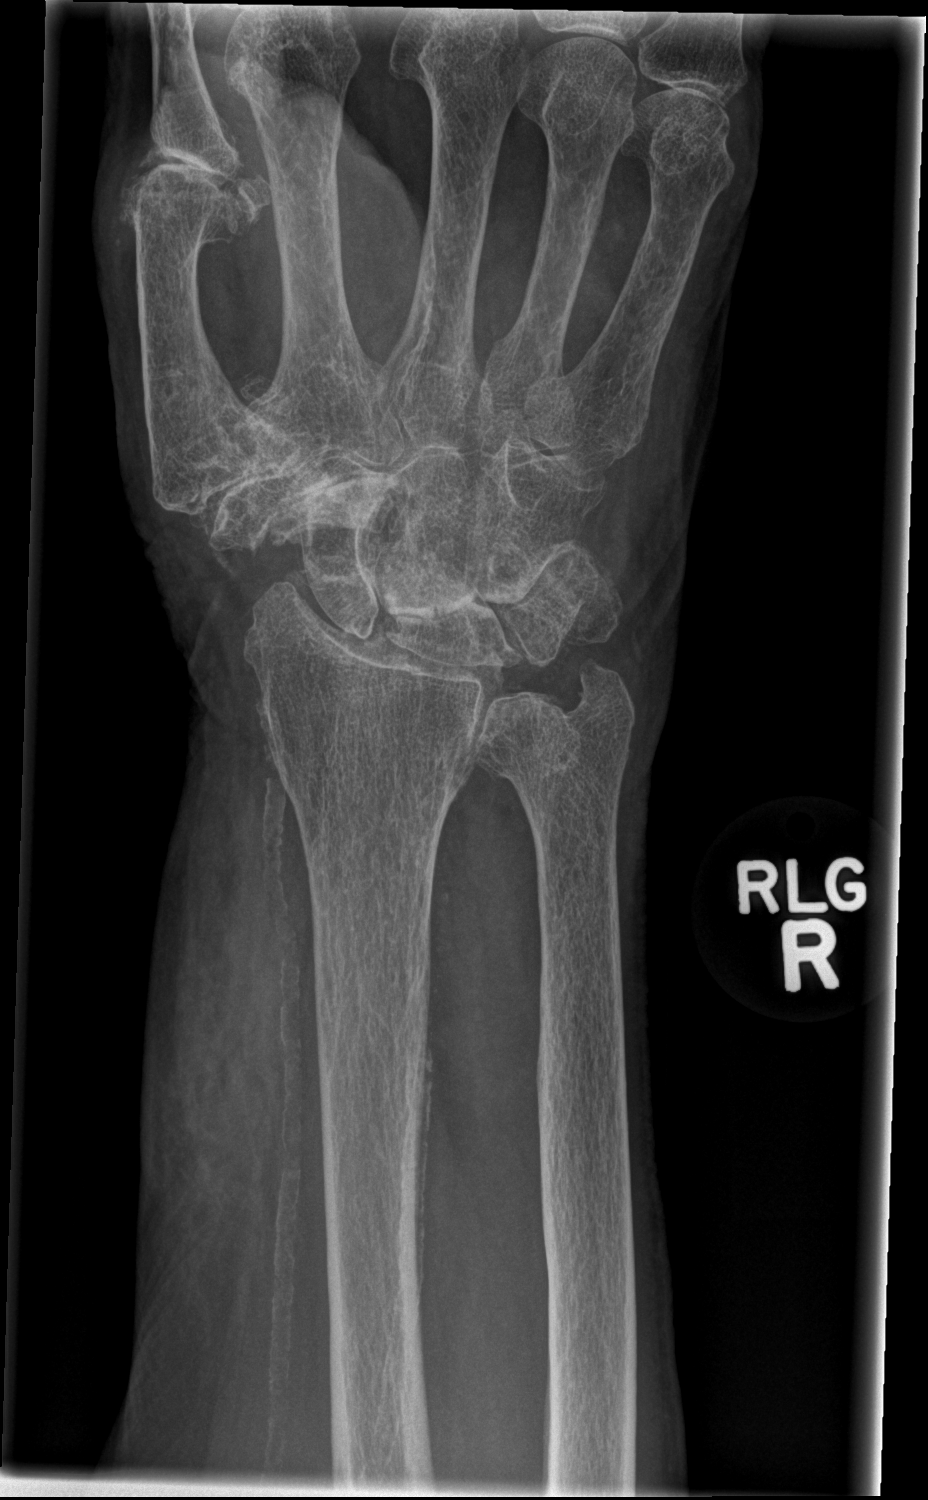

[x wrist obl right]
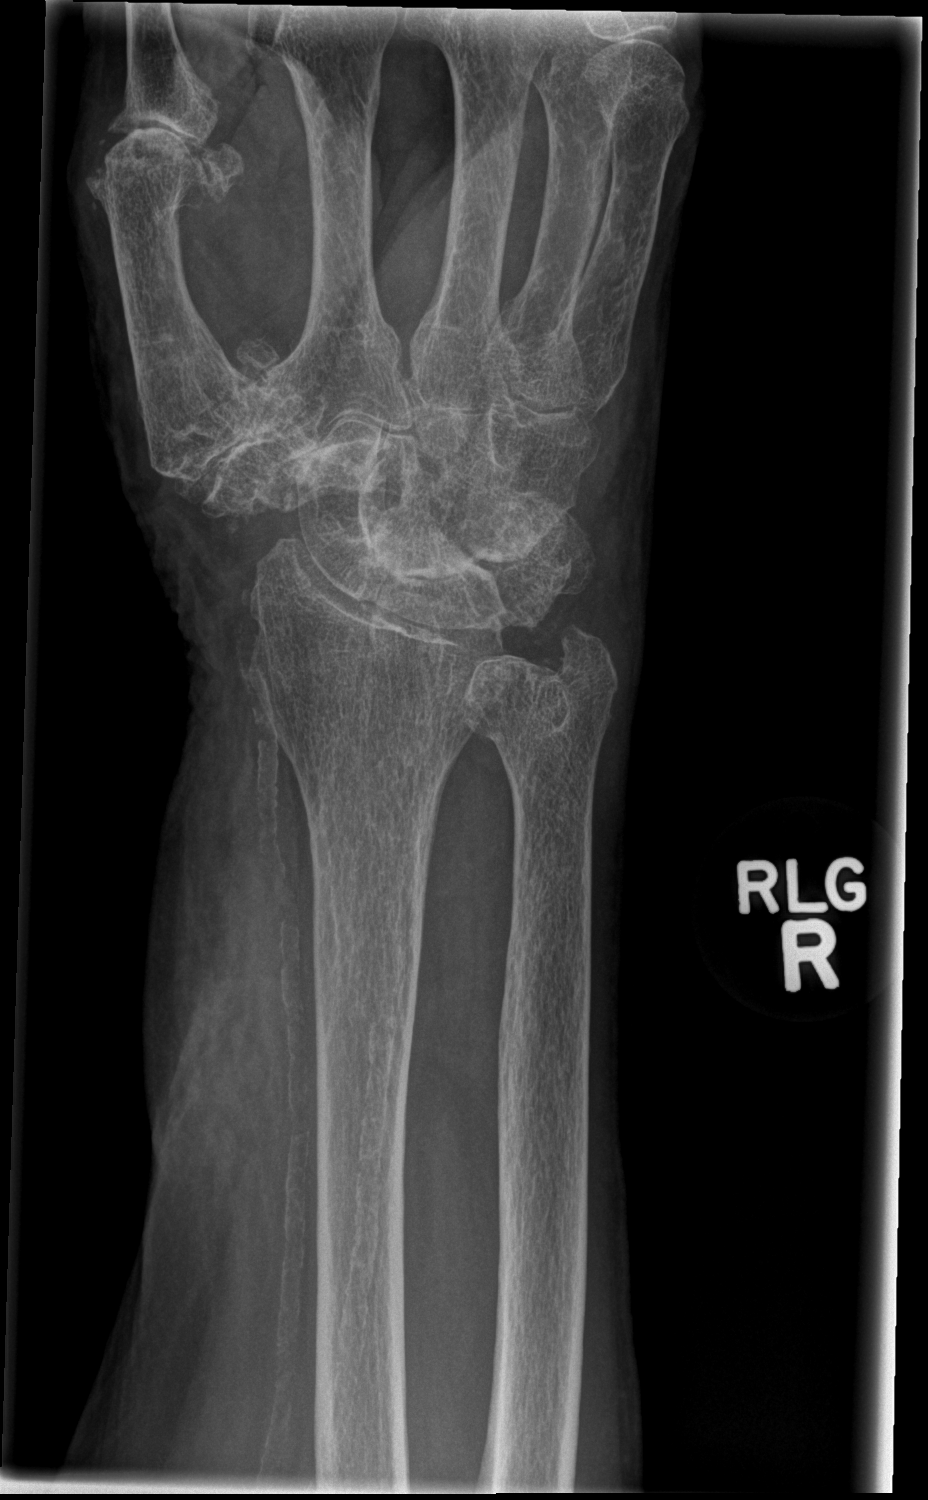

[x wrist lat right]
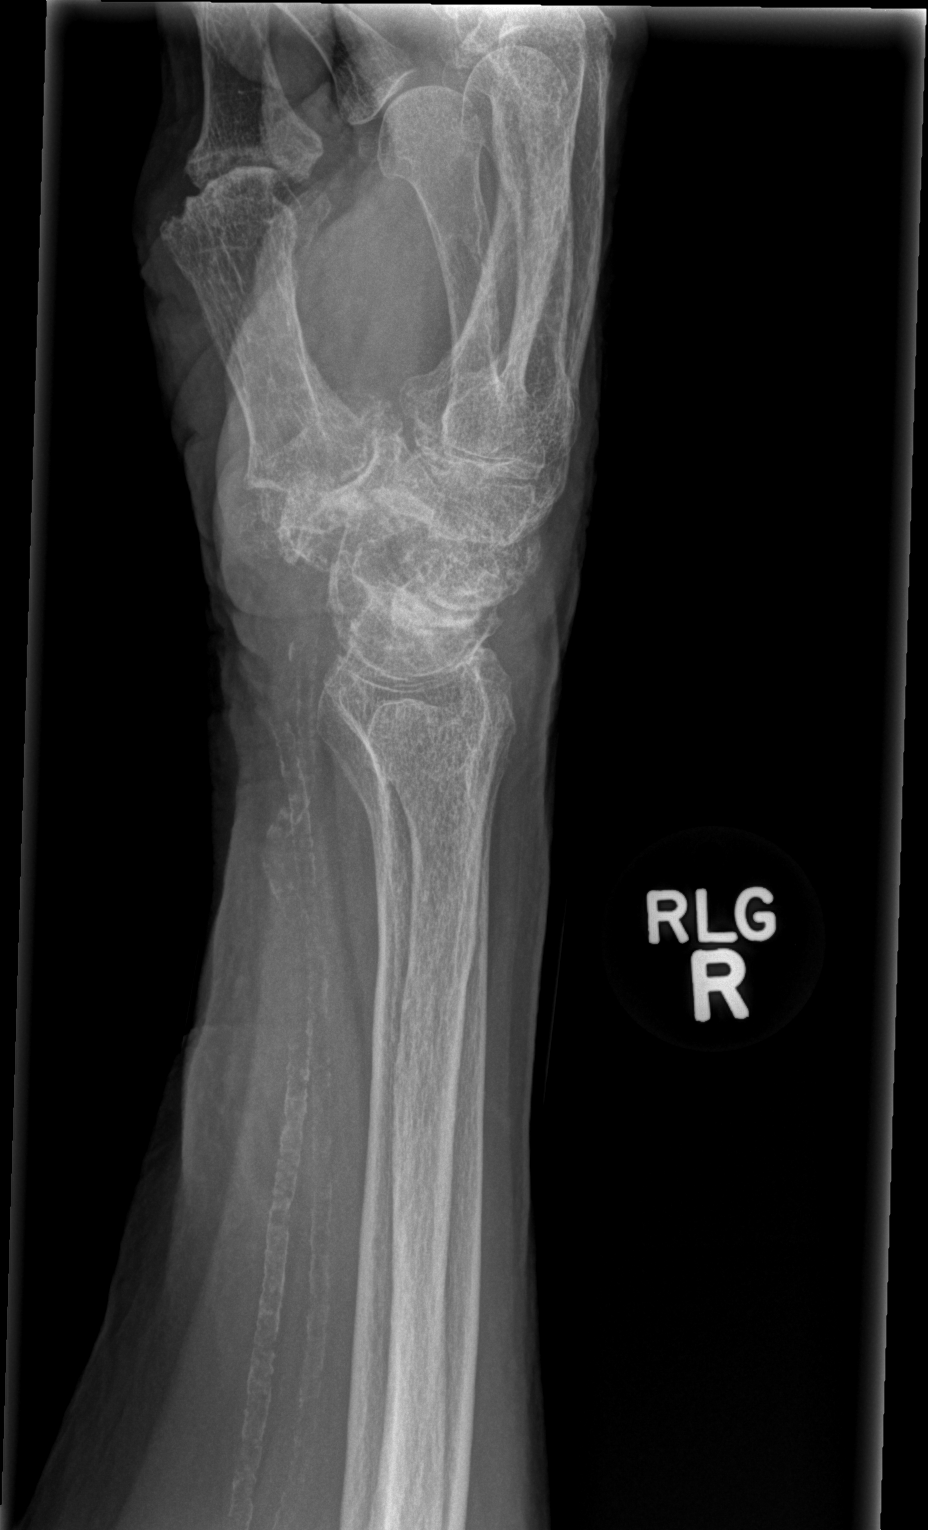

[x wrist navicular view right (1 of 2)]
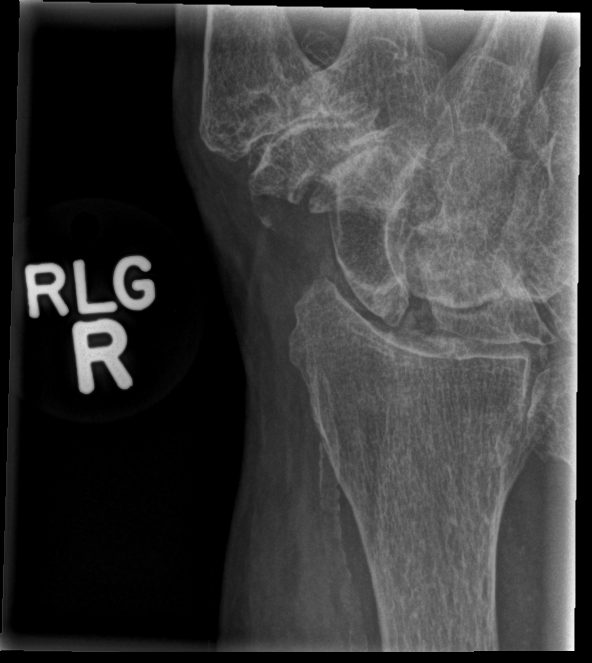

[x wrist navicular view right (2 of 2)]
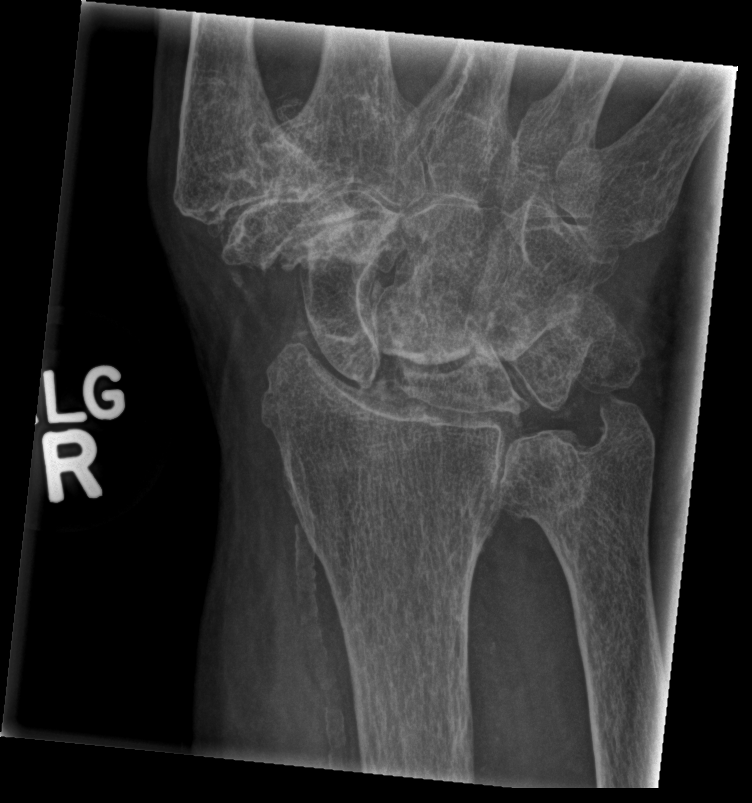

[5 of 5 positions shown; findings below may reference images not displayed]

FINDINGS: There is prominent soft tissue swelling in the radial distal right
forearm. No fracture, dislocation or suspicious focal osseous
lesion. Severe osteoarthritis in the scaphoid- trapezium, first
carpometacarpal and first metacarpophalangeal joints. Vascular
calcifications throughout the soft tissues.
IMPRESSION: Prominent soft tissue swelling in the radial distal right forearm.
No fracture or dislocation in the right wrist. Polyarticular
osteoarthritis as described.

## 2016-06-10 IMAGING — DX DG CHEST 1V PORT
1 series · 1 of 1 positions shown · non-contrast
Comparison: 07/07/2015

CLINICAL DATA: Cough and weakness

EXAM:
PORTABLE CHEST 1 VIEW

[chest ap]
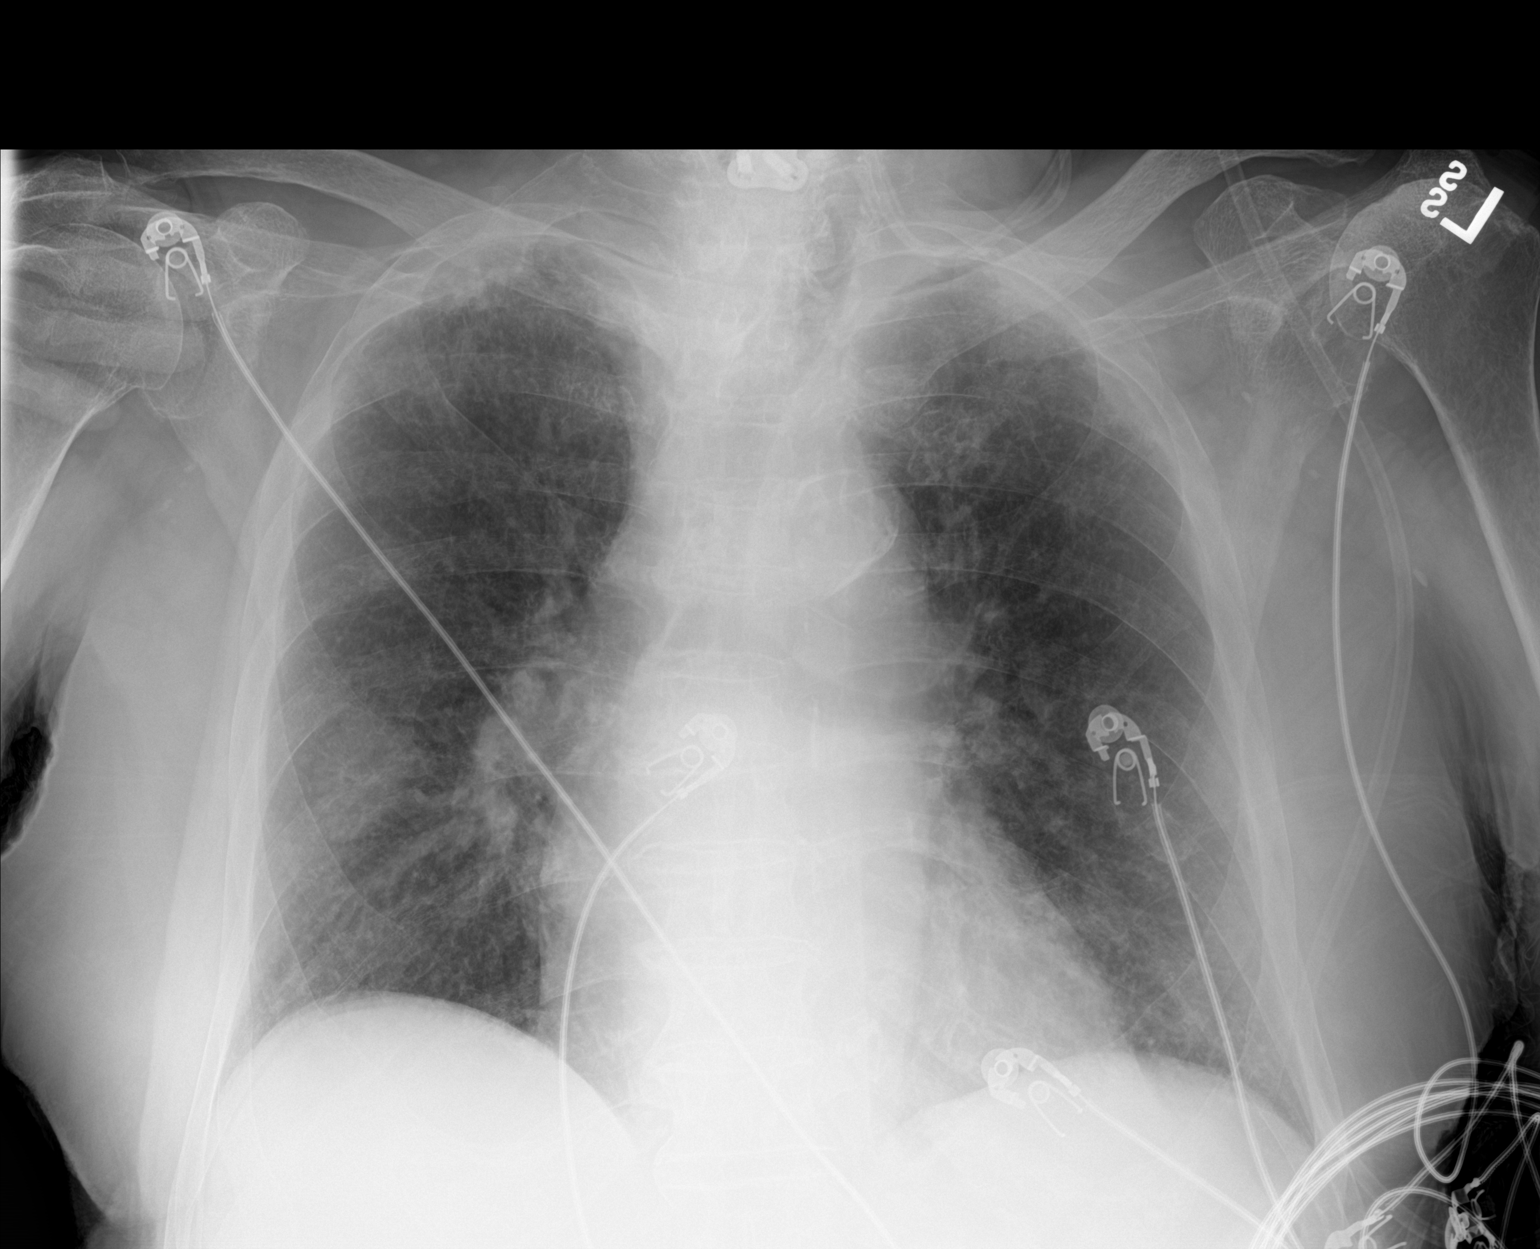

[1 of 1 positions shown; findings below may reference images not displayed]

FINDINGS: No cardiomegaly.  Negative aortic and hilar contours.

Chronic interstitial coarsening. There is no edema, consolidation,
effusion, or pneumothorax. Re- demonstrated now 37 mm nodule over
the right lung. Symmetric biapical pleural scarring/thickening.
IMPRESSION: 1. No acute finding.
2. 37 mm right lung nodule primarily concerning for carcinoma.
Recommend follow-up chest CT (contrast not essential in this patient
with stage 4 chronic kidney disease).
3. Chronic lung disease.
